# Patient Record
Sex: Female | Born: 1980 | Race: White | Hispanic: No | Marital: Married | Smoking: Former smoker
Health system: Southern US, Community
[De-identification: ages and names within clinical notes are randomized; demographics above are authoritative.]

## PROBLEM LIST (undated history)

## (undated) DIAGNOSIS — Z8619 Personal history of other infectious and parasitic diseases: Secondary | ICD-10-CM

## (undated) DIAGNOSIS — R112 Nausea with vomiting, unspecified: Secondary | ICD-10-CM

## (undated) DIAGNOSIS — Z9889 Other specified postprocedural states: Secondary | ICD-10-CM

## (undated) DIAGNOSIS — R87619 Unspecified abnormal cytological findings in specimens from cervix uteri: Secondary | ICD-10-CM

## (undated) DIAGNOSIS — K759 Inflammatory liver disease, unspecified: Secondary | ICD-10-CM

## (undated) DIAGNOSIS — G43909 Migraine, unspecified, not intractable, without status migrainosus: Secondary | ICD-10-CM

## (undated) DIAGNOSIS — IMO0002 Reserved for concepts with insufficient information to code with codable children: Secondary | ICD-10-CM

## (undated) DIAGNOSIS — Z853 Personal history of malignant neoplasm of breast: Secondary | ICD-10-CM

## (undated) DIAGNOSIS — R110 Nausea: Secondary | ICD-10-CM

## (undated) DIAGNOSIS — K219 Gastro-esophageal reflux disease without esophagitis: Secondary | ICD-10-CM

## (undated) DIAGNOSIS — Z973 Presence of spectacles and contact lenses: Secondary | ICD-10-CM

## (undated) DIAGNOSIS — R51 Headache: Secondary | ICD-10-CM

## (undated) DIAGNOSIS — R87629 Unspecified abnormal cytological findings in specimens from vagina: Secondary | ICD-10-CM

## (undated) HISTORY — PX: SEPTOPLASTY: SUR1290

## (undated) HISTORY — DX: Reserved for concepts with insufficient information to code with codable children: IMO0002

## (undated) HISTORY — DX: Unspecified abnormal cytological findings in specimens from vagina: R87.629

## (undated) HISTORY — PX: TONSILLECTOMY: SUR1361

## (undated) HISTORY — DX: Personal history of other infectious and parasitic diseases: Z86.19

## (undated) HISTORY — PX: WISDOM TOOTH EXTRACTION: SHX21

## (undated) HISTORY — DX: Nausea: R11.0

## (undated) HISTORY — DX: Migraine, unspecified, not intractable, without status migrainosus: G43.909

## (undated) HISTORY — DX: Unspecified abnormal cytological findings in specimens from cervix uteri: R87.619

## (undated) HISTORY — DX: Other specified postprocedural states: Z98.890

---

## 2004-03-02 DIAGNOSIS — K759 Inflammatory liver disease, unspecified: Secondary | ICD-10-CM

## 2004-03-02 HISTORY — DX: Inflammatory liver disease, unspecified: K75.9

## 2008-09-23 ENCOUNTER — Ambulatory Visit: Payer: Self-pay | Admitting: Internal Medicine

## 2008-09-23 DIAGNOSIS — G43909 Migraine, unspecified, not intractable, without status migrainosus: Secondary | ICD-10-CM | POA: Insufficient documentation

## 2008-10-17 ENCOUNTER — Telehealth: Payer: Self-pay | Admitting: Internal Medicine

## 2009-07-19 ENCOUNTER — Encounter: Payer: Self-pay | Admitting: Internal Medicine

## 2009-08-10 ENCOUNTER — Ambulatory Visit: Payer: Self-pay | Admitting: Internal Medicine

## 2009-08-10 DIAGNOSIS — J351 Hypertrophy of tonsils: Secondary | ICD-10-CM | POA: Insufficient documentation

## 2009-08-10 DIAGNOSIS — K120 Recurrent oral aphthae: Secondary | ICD-10-CM | POA: Insufficient documentation

## 2009-08-11 ENCOUNTER — Telehealth: Payer: Self-pay | Admitting: Internal Medicine

## 2009-10-12 ENCOUNTER — Ambulatory Visit: Payer: Self-pay | Admitting: Internal Medicine

## 2009-10-19 ENCOUNTER — Ambulatory Visit (HOSPITAL_BASED_OUTPATIENT_CLINIC_OR_DEPARTMENT_OTHER): Admission: RE | Admit: 2009-10-19 | Discharge: 2009-10-19 | Payer: Self-pay | Admitting: Otolaryngology

## 2009-10-19 ENCOUNTER — Ambulatory Visit: Payer: Self-pay | Admitting: Diagnostic Radiology

## 2009-11-23 ENCOUNTER — Ambulatory Visit (HOSPITAL_BASED_OUTPATIENT_CLINIC_OR_DEPARTMENT_OTHER): Admission: RE | Admit: 2009-11-23 | Discharge: 2009-11-23 | Payer: Self-pay | Admitting: Otolaryngology

## 2010-03-13 ENCOUNTER — Ambulatory Visit: Payer: Self-pay | Admitting: Nurse Practitioner

## 2010-03-13 ENCOUNTER — Other Ambulatory Visit: Payer: Self-pay | Admitting: Emergency Medicine

## 2010-03-13 ENCOUNTER — Inpatient Hospital Stay (HOSPITAL_COMMUNITY): Admission: AD | Admit: 2010-03-13 | Discharge: 2010-03-13 | Payer: Self-pay | Admitting: Obstetrics and Gynecology

## 2010-09-02 HISTORY — PX: TONSILLECTOMY: SUR1361

## 2010-09-02 HISTORY — PX: SEPTOPLASTY: SUR1290

## 2010-10-02 NOTE — Assessment & Plan Note (Signed)
Summary: Shannon Hale   Leafy Half   Vital Signs:  Patient profile:   30 year old female Height:      69 inches Weight:      187.50 pounds BMI:     27.79 O2 Sat:      97 % on Room air Temp:     98.2 degrees F oral Pulse rate:   126 / minute BP sitting:   114 / 74  (right arm)  Vitals Entered By: Lucious Groves (October 12, 2009 1:17 PM)  O2 Flow:  Room air CC: C/O more regular and severe HA, they are now more associated with cycle. Discuss changing med., Headaches Is Patient Diabetic? No Pain Assessment Patient in pain? yes      Intensity: 3 Onset of pain  very mild HA right now   Primary Care Provider:  DThomos Lemons DO  CC:  C/O more regular and severe HA, they are now more associated with cycle. Discuss changing med., and Headaches.  History of Present Illness:  Headaches      This is a 30 year old woman who presents with Headaches.  The patient reports nausea and photophobia.  The headache is described as intermittent and throbbing.  The location of the pain is unilateral on the left.  headaches seem to be getting worse.  symptoms precipitated by menses.  poor response to imitrex 50 mg in the past  Current Medications (verified): 1)  Ocella 3-0.03 Mg Tabs (Drospirenone-Ethinyl Estradiol) .... Take 1 Tablet By Mouth Once A Day 2)  Valacyclovir Hcl 500 Mg Tabs (Valacyclovir Hcl) .... One By Mouth Bid  Allergies (verified): No Known Drug Allergies  Past History:  Past Medical History: Migraine Headaches Vasovagal fainting 2001 and 2003  Transient hepatitis from food poisoning 2005  Deviated nasal septum  Family History: Breast ca - mother (alive at age 14) CAD - father (?angina episode at age 90 -  no cath, no recurrence),  PGF - CABG age 59 Depression - MGM Diabetes - MGM and PGM Colon ca - PGF   Social History: Occupation:  Education officer, environmental Married for 3 yrs Moved to [place] from [place] Husband and wife like to play tennis Never Smoked  Alcohol use-yes     Physical Exam  General:  alert, well-developed, and well-nourished.   Head:  normocephalic and atraumatic.   Eyes:  pupils equal, pupils round, and pupils reactive to light.   Lungs:  normal respiratory effort and normal breath sounds.   Heart:  normal rate, regular rhythm, no murmur, and no gallop.   Neurologic:  cranial nerves II-XII intact, strength normal in all extremities, gait normal, DTRs symmetrical and normal, and finger-to-nose normal.     Impression & Recommendations:  Problem # 1:  MIGRAINE HEADACHE (ICD-346.90) Pt having menstrual migraines.  Pt advised to start naproxen 220 mg over the counter 2-3 days before cycle starts.  If migraine occurs, take additional alleve and maxalt at the same time.  The following medications were removed from the medication list:    Sumatriptan Succinate 100 Mg Tabs (Sumatriptan succinate) ..... One by mouth once daily prn Her updated medication list for this problem includes:    Maxalt-mlt 10 Mg Tbdp (Rizatriptan benzoate) .Marland Kitchen... Take as directed  Complete Medication List: 1)  Ocella 3-0.03 Mg Tabs (Drospirenone-ethinyl estradiol) .... Take 1 tablet by mouth once a day 2)  Valacyclovir Hcl 500 Mg Tabs (Valacyclovir hcl) .... One by mouth bid 3)  Maxalt-mlt 10 Mg Tbdp (Rizatriptan benzoate) .Marland KitchenMarland KitchenMarland Kitchen  Take as directed 4)  Omeprazole 20 Mg Cpdr (Omeprazole) .... One by mouth once daily  Patient Instructions: 1)  Please schedule a follow-up appointment in 2 months. Prescriptions: OMEPRAZOLE 20 MG CPDR (OMEPRAZOLE) one by mouth once daily  #30 x 2   Entered and Authorized by:   D. Thomos Lemons DO   Signed by:   D. Thomos Lemons DO on 10/12/2009   Method used:   Electronically to        [facility]* (retail)       [address]       [address] Shipping/mailing       [place], [place]  [address]       Ph: [phone]       Fax: [phone]   RxID:   [id number] MAXALT-MLT 10 MG TBDP (RIZATRIPTAN BENZOATE) take as  directed  #10 x 2   Entered and Authorized by:   D. Thomos Lemons DO   Signed by:   D. Thomos Lemons DO on 10/12/2009   Method used:   Electronically to        [facility]* (retail)       [address]       [address]mail[address]       [place], [place]  [address]       Ph: [phone]       Fax: [phone]   RxID:   [id number]

## 2010-11-18 LAB — CBC
HCT: 37.2 % (ref 36.0–46.0)
HCT: 35.3 % — ABNORMAL LOW (ref 36.0–46.0)
Hemoglobin: 12.8 g/dL (ref 12.0–15.0)
Hemoglobin: 12.1 g/dL (ref 12.0–15.0)
Hemoglobin: 12.9 g/dL (ref 12.0–15.0)
MCH: 30 pg (ref 26.0–34.0)
MCH: 30 pg (ref 26.0–34.0)
MCHC: 34.5 g/dL (ref 30.0–36.0)
MCV: 87.6 fL (ref 78.0–100.0)
MCV: 87.7 fL (ref 78.0–100.0)
Platelets: 224 10*3/uL (ref 150–400)
RBC: 4.33 MIL/uL (ref 3.87–5.11)
RDW: 12.2 % (ref 11.5–15.5)
WBC: 12.8 10*3/uL — ABNORMAL HIGH (ref 4.0–10.5)
WBC: 13.1 10*3/uL — ABNORMAL HIGH (ref 4.0–10.5)

## 2010-11-18 LAB — DIFFERENTIAL
Basophils Absolute: 0.1 10*3/uL (ref 0.0–0.1)
Eosinophils Relative: 1 % (ref 0–5)
Lymphocytes Relative: 31 % (ref 12–46)
Lymphs Abs: 3.9 10*3/uL (ref 0.7–4.0)
Monocytes Relative: 6 % (ref 3–12)
Neutro Abs: 7.9 10*3/uL — ABNORMAL HIGH (ref 1.7–7.7)

## 2010-11-18 LAB — BASIC METABOLIC PANEL
CO2: 23 mEq/L (ref 19–32)
Calcium: 9.7 mg/dL (ref 8.4–10.5)
Creatinine, Ser: 0.8 mg/dL (ref 0.4–1.2)
GFR calc Af Amer: >60 mL/min (ref >60)
GFR calc non Af Amer: >60 mL/min (ref >60)

## 2010-11-18 LAB — ABO/RH: ABO/RH(D): A POS

## 2010-11-18 LAB — HCG, QUANTITATIVE, PREGNANCY: hCG, Beta Chain, Quant, S: 57 m[IU]/mL — ABNORMAL HIGH (ref <5)

## 2010-11-19 ENCOUNTER — Ambulatory Visit (INDEPENDENT_AMBULATORY_CARE_PROVIDER_SITE_OTHER): Payer: Commercial Managed Care - PPO | Admitting: Internal Medicine

## 2010-11-19 DIAGNOSIS — M546 Pain in thoracic spine: Secondary | ICD-10-CM

## 2010-11-19 DIAGNOSIS — G43909 Migraine, unspecified, not intractable, without status migrainosus: Secondary | ICD-10-CM

## 2010-11-26 LAB — HEPATIC FUNCTION PANEL
Bilirubin, Direct: 0.2 mg/dL (ref 0.0–0.3)
Total Bilirubin: 0.3 mg/dL (ref 0.3–1.2)

## 2010-11-26 LAB — BASIC METABOLIC PANEL
Calcium: 9.5 mg/dL (ref 8.4–10.5)
Chloride: 104 mEq/L (ref 96–112)
Glucose, Bld: 100 mg/dL — ABNORMAL HIGH (ref 70–99)
Potassium: 4.1 mEq/L (ref 3.5–5.1)

## 2011-01-29 LAB — RPR: RPR: NONREACTIVE

## 2011-01-29 LAB — ANTIBODY SCREEN: Antibody Screen: NEGATIVE

## 2011-01-29 LAB — RUBELLA ANTIBODY, IGM: Rubella: IMMUNE

## 2011-07-29 LAB — STREP B DNA PROBE: GBS: NEGATIVE

## 2011-08-30 ENCOUNTER — Telehealth (HOSPITAL_COMMUNITY): Payer: Self-pay | Admitting: *Deleted

## 2011-08-30 ENCOUNTER — Encounter (HOSPITAL_COMMUNITY): Payer: Self-pay | Admitting: *Deleted

## 2011-08-30 NOTE — Telephone Encounter (Signed)
Preadmission screen  

## 2011-09-02 ENCOUNTER — Inpatient Hospital Stay (HOSPITAL_COMMUNITY): Payer: 59 | Admitting: Anesthesiology

## 2011-09-02 ENCOUNTER — Encounter (HOSPITAL_COMMUNITY): Payer: Self-pay | Admitting: *Deleted

## 2011-09-02 ENCOUNTER — Encounter (HOSPITAL_COMMUNITY): Payer: Self-pay | Admitting: Anesthesiology

## 2011-09-02 ENCOUNTER — Inpatient Hospital Stay (HOSPITAL_COMMUNITY)
Admission: AD | Admit: 2011-09-02 | Discharge: 2011-09-04 | DRG: 775 | Disposition: A | Discharge status: Home / Self Care | Payer: 59 | Source: Ambulatory Visit | Attending: Obstetrics and Gynecology | Admitting: Obstetrics and Gynecology

## 2011-09-02 ENCOUNTER — Other Ambulatory Visit: Payer: Self-pay | Admitting: Obstetrics and Gynecology

## 2011-09-02 DIAGNOSIS — G43909 Migraine, unspecified, not intractable, without status migrainosus: Secondary | ICD-10-CM

## 2011-09-02 DIAGNOSIS — J351 Hypertrophy of tonsils: Secondary | ICD-10-CM

## 2011-09-02 DIAGNOSIS — M546 Pain in thoracic spine: Secondary | ICD-10-CM

## 2011-09-02 DIAGNOSIS — K120 Recurrent oral aphthae: Secondary | ICD-10-CM

## 2011-09-02 LAB — CBC
MCH: 30 pg (ref 26.0–34.0)
MCHC: 35.3 g/dL (ref 30.0–36.0)
Platelets: 230 10*3/uL (ref 150–400)
RDW: 12.9 % (ref 11.5–15.5)

## 2011-09-02 LAB — RPR: RPR Ser Ql: NONREACTIVE

## 2011-09-02 MED ORDER — LACTATED RINGERS IV SOLN
INTRAVENOUS | Status: DC
  Administered 2011-09-02 (×2): via INTRAVENOUS

## 2011-09-02 MED ORDER — SIMETHICONE 80 MG PO CHEW: 80.0000 mg | CHEWABLE_TABLET | ORAL | Status: DC | PRN

## 2011-09-02 MED ORDER — WITCH HAZEL-GLYCERIN EX PADS
1.0000 "application " | MEDICATED_PAD | CUTANEOUS | Status: DC | PRN
  Administered 2011-09-02: 1 via TOPICAL

## 2011-09-02 MED ORDER — ACETAMINOPHEN 325 MG PO TABS: 650.0000 mg | ORAL_TABLET | ORAL | Status: DC | PRN

## 2011-09-02 MED ORDER — LACTATED RINGERS IV SOLN: 500.0000 mL | Freq: Once | INTRAVENOUS | Status: DC

## 2011-09-02 MED ORDER — DIPHENHYDRAMINE HCL 50 MG/ML IJ SOLN: 12.5000 mg | INTRAMUSCULAR | Status: DC | PRN

## 2011-09-02 MED ORDER — OXYTOCIN BOLUS FROM INFUSION
500.0000 mL | Freq: Once | INTRAVENOUS | Status: DC
  Filled 2011-09-02: qty 500
  Filled 2011-09-02: qty 1000

## 2011-09-02 MED ORDER — TERBUTALINE SULFATE 1 MG/ML IJ SOLN: 0.2500 mg | Freq: Once | INTRAMUSCULAR | Status: DC | PRN

## 2011-09-02 MED ORDER — LIDOCAINE HCL 1.5 % IJ SOLN
INTRAMUSCULAR | Status: DC | PRN
  Administered 2011-09-02 (×2): 5 mL via EPIDURAL

## 2011-09-02 MED ORDER — CITRIC ACID-SODIUM CITRATE 334-500 MG/5ML PO SOLN: 30.0000 mL | ORAL | Status: DC | PRN

## 2011-09-02 MED ORDER — DIPHENHYDRAMINE HCL 25 MG PO CAPS: 25.0000 mg | ORAL_CAPSULE | Freq: Four times a day (QID) | ORAL | Status: DC | PRN

## 2011-09-02 MED ORDER — PRENATAL MULTIVITAMIN CH
1.0000 | ORAL_TABLET | Freq: Every day | ORAL | Status: DC
  Administered 2011-09-03 – 2011-09-04 (×2): 1 via ORAL
  Filled 2011-09-02 (×2): qty 1

## 2011-09-02 MED ORDER — LACTATED RINGERS IV SOLN
500.0000 mL | INTRAVENOUS | Status: DC | PRN
  Administered 2011-09-02: 1000 mL via INTRAVENOUS

## 2011-09-02 MED ORDER — PHENYLEPHRINE 40 MCG/ML (10ML) SYRINGE FOR IV PUSH (FOR BLOOD PRESSURE SUPPORT)
80.0000 ug | PREFILLED_SYRINGE | INTRAVENOUS | Status: DC | PRN
  Filled 2011-09-02: qty 5

## 2011-09-02 MED ORDER — ZOLPIDEM TARTRATE 5 MG PO TABS: 5.0000 mg | ORAL_TABLET | Freq: Every evening | ORAL | Status: DC | PRN

## 2011-09-02 MED ORDER — ONDANSETRON HCL 4 MG PO TABS: 4.0000 mg | ORAL_TABLET | ORAL | Status: DC | PRN

## 2011-09-02 MED ORDER — OXYCODONE-ACETAMINOPHEN 5-325 MG PO TABS
1.0000 | ORAL_TABLET | ORAL | Status: DC | PRN
  Administered 2011-09-03 (×3): 1 via ORAL
  Filled 2011-09-02 (×3): qty 1

## 2011-09-02 MED ORDER — SENNOSIDES-DOCUSATE SODIUM 8.6-50 MG PO TABS
2.0000 | ORAL_TABLET | Freq: Every day | ORAL | Status: DC
  Administered 2011-09-02 – 2011-09-03 (×2): 2 via ORAL

## 2011-09-02 MED ORDER — OXYTOCIN 20 UNITS IN LACTATED RINGERS INFUSION - SIMPLE
1.0000 m[IU]/min | INTRAVENOUS | Status: DC
  Administered 2011-09-02: 2 m[IU]/min via INTRAVENOUS
  Administered 2011-09-02: 1 m[IU]/min via INTRAVENOUS

## 2011-09-02 MED ORDER — IBUPROFEN 600 MG PO TABS
600.0000 mg | ORAL_TABLET | Freq: Four times a day (QID) | ORAL | Status: DC
  Administered 2011-09-02 – 2011-09-04 (×7): 600 mg via ORAL
  Filled 2011-09-02 (×7): qty 1

## 2011-09-02 MED ORDER — FLEET ENEMA 7-19 GM/118ML RE ENEM: 1.0000 | ENEMA | RECTAL | Status: DC | PRN

## 2011-09-02 MED ORDER — ONDANSETRON HCL 4 MG/2ML IJ SOLN: 4.0000 mg | INTRAMUSCULAR | Status: DC | PRN

## 2011-09-02 MED ORDER — PHENYLEPHRINE 40 MCG/ML (10ML) SYRINGE FOR IV PUSH (FOR BLOOD PRESSURE SUPPORT): 80.0000 ug | PREFILLED_SYRINGE | INTRAVENOUS | Status: DC | PRN

## 2011-09-02 MED ORDER — EPHEDRINE 5 MG/ML INJ
10.0000 mg | INTRAVENOUS | Status: DC | PRN
  Filled 2011-09-02: qty 4

## 2011-09-02 MED ORDER — OXYTOCIN 20 UNITS IN LACTATED RINGERS INFUSION - SIMPLE
125.0000 mL/h | Freq: Once | INTRAVENOUS | Status: AC
  Administered 2011-09-02: 500 mL/h via INTRAVENOUS

## 2011-09-02 MED ORDER — TETANUS-DIPHTH-ACELL PERTUSSIS 5-2.5-18.5 LF-MCG/0.5 IM SUSP: 0.5000 mL | Freq: Once | INTRAMUSCULAR | Status: DC

## 2011-09-02 MED ORDER — BENZOCAINE-MENTHOL 20-0.5 % EX AERO
1.0000 "application " | INHALATION_SPRAY | CUTANEOUS | Status: DC | PRN
  Administered 2011-09-02: 1 via TOPICAL

## 2011-09-02 MED ORDER — OXYCODONE-ACETAMINOPHEN 5-325 MG PO TABS: 2.0000 | ORAL_TABLET | ORAL | Status: DC | PRN

## 2011-09-02 MED ORDER — DIBUCAINE 1 % RE OINT
1.0000 "application " | TOPICAL_OINTMENT | RECTAL | Status: DC | PRN
  Administered 2011-09-02: 1 via RECTAL
  Filled 2011-09-02: qty 28

## 2011-09-02 MED ORDER — ONDANSETRON HCL 4 MG/2ML IJ SOLN
4.0000 mg | Freq: Four times a day (QID) | INTRAMUSCULAR | Status: DC | PRN
  Administered 2011-09-02: 4 mg via INTRAVENOUS
  Filled 2011-09-02: qty 2

## 2011-09-02 MED ORDER — FENTANYL 2.5 MCG/ML BUPIVACAINE 1/10 % EPIDURAL INFUSION (WH - ANES)
14.0000 mL/h | INTRAMUSCULAR | Status: DC
  Administered 2011-09-02 (×3): 14 mL/h via EPIDURAL
  Filled 2011-09-02 (×3): qty 60

## 2011-09-02 MED ORDER — BENZOCAINE-MENTHOL 20-0.5 % EX AERO
INHALATION_SPRAY | CUTANEOUS | Status: AC
  Administered 2011-09-02: 1 via TOPICAL
  Filled 2011-09-02: qty 56

## 2011-09-02 MED ORDER — LANOLIN HYDROUS EX OINT: TOPICAL_OINTMENT | CUTANEOUS | Status: DC | PRN

## 2011-09-02 MED ORDER — EPHEDRINE 5 MG/ML INJ: 10.0000 mg | INTRAVENOUS | Status: DC | PRN

## 2011-09-02 MED ORDER — LIDOCAINE HCL (PF) 1 % IJ SOLN
30.0000 mL | INTRAMUSCULAR | Status: DC | PRN
  Filled 2011-09-02: qty 30

## 2011-09-02 MED ORDER — IBUPROFEN 600 MG PO TABS
600.0000 mg | ORAL_TABLET | Freq: Four times a day (QID) | ORAL | Status: DC | PRN
  Administered 2011-09-02: 600 mg via ORAL
  Filled 2011-09-02: qty 1

## 2011-09-02 NOTE — Progress Notes (Signed)
Pt states been contracting since 0030

## 2011-09-02 NOTE — H&P (Signed)
Shannon Hale is a 30 y.o. female presenting for UCs starting about 12:30am. No ROM, no HA/vision change, no epigastric pain. Maternal Medical History:  Reason for admission: Reason for admission: contractions.  Contractions: Onset was 6-12 hours ago.   Frequency: regular.   Perceived severity is strong.    Fetal activity: Perceived fetal activity is normal.      OB History    Grav Para Term Preterm Abortions TAB SAB Ect Mult Living   2 0   1  1   0     Past Medical History  Diagnosis Date  . Abnormal Pap smear   . History of chicken pox    Past Surgical History  Procedure Date  . Tonsillectomy   . Septoplasty   . Wisdom tooth extraction   . No past surgeries    Family History: family history includes Cancer in her maternal grandmother, mother, and paternal grandfather; Depression in her maternal grandmother; Diabetes in her maternal grandmother, paternal grandmother, and paternal uncle; Heart disease in her father and paternal grandfather; Hypertension in her maternal grandmother; and Seizures in her brother. Social History:  reports that she has never smoked. She has never used smokeless tobacco. She reports that she does not drink alcohol or use illicit drugs.  Review of Systems  Eyes: Negative for blurred vision.  Gastrointestinal: Negative for abdominal pain.  Neurological: Negative for headaches.    Dilation: 4 Effacement (%): 100 Station: 0 Exam by:: Dr. Henderson Cloud Blood pressure 118/64, pulse 87, temperature 98.4 F (36.9 C), temperature source Oral, resp. rate 16, height 5\' 9"  (1.753 m), weight 102.059 kg (225 lb), SpO2 97.00%. Maternal Exam:  Uterine Assessment: Contraction strength is firm.  Contraction frequency is regular.      Fetal Exam Fetal Monitor Review: Pattern: accelerations present.       Physical Exam  Cardiovascular: Normal rate and regular rhythm.   Respiratory: Effort normal and breath sounds normal.  GI: There is no tenderness.    Neurological: She has normal reflexes.  Epidural in  AROM  Clear Prenatal labs: ABO, Rh:   Antibody: Negative (05/29 0000) Rubella: Immune (05/29 0000) RPR: Nonreactive (05/29 0000)  HBsAg: Negative (05/29 0000)  HIV: Non-reactive (05/29 0000)  GBS: Negative (11/26 0000)   Assessment/Plan: 30 yo MWF G2P0 at 40 0/7 weeks with active labor . All questions answered.  Shannon Hale,Shannon Hale E 09/02/2011, 8:28 AM

## 2011-09-02 NOTE — Anesthesia Preprocedure Evaluation (Signed)

## 2011-09-02 NOTE — Anesthesia Procedure Notes (Signed)
Epidural Patient location during procedure: OB Start time: 09/02/2011 8:02 AM  Staffing Anesthesiologist: Brayton Caves R Performed by: anesthesiologist   Preanesthetic Checklist Completed: patient identified, site marked, surgical consent, pre-op evaluation, timeout performed, IV checked, risks and benefits discussed and monitors and equipment checked  Epidural Patient position: sitting Prep: site prepped and draped and DuraPrep Patient monitoring: continuous pulse ox and blood pressure Approach: midline Injection technique: LOR air and LOR saline  Needle:  Needle type: Tuohy  Needle gauge: 17 G Needle length: 9 cm Needle insertion depth: 6 cm Catheter type: closed end flexible Catheter size: 19 Gauge Catheter at skin depth: 11 cm Test dose: negative  Assessment Events: blood not aspirated, injection not painful, no injection resistance, negative IV test and no paresthesia  Additional Notes Patient identified.  Risk benefits discussed including failed block, incomplete pain control, headache, nerve damage, paralysis, blood pressure changes, nausea, vomiting, reactions to medication both toxic or allergic, and postpartum back pain.  Patient expressed understanding and wished to proceed.  All questions were answered.  Sterile technique used throughout procedure and epidural site dressed with sterile barrier dressing. No paresthesia or other complications noted.The patient did not experience any signs of intravascular injection such as tinnitus or metallic taste in mouth nor signs of intrathecal spread such as rapid motor block. Please see nursing notes for vital signs.

## 2011-09-02 NOTE — Progress Notes (Signed)
Delivery Note Pushing x 2.5 hrs Variable decels to 70-80s getting deeper Meconium noted vtx at [facility], crowning Pt unable to push past perineum despite good effort D/W pt/husband MLE, poss VE with risks, all questions answered Second degree MLE performed Over 2 UCs, SVD of VMI apgars 8/9 Art pH pending Wt 7#, 1oz Placenta 3 vessels, intact to path Cervix/vagina intact Third degree extension of MLE repaired, rectum checked Pt/infant stable in LDR

## 2011-09-03 LAB — CBC
HCT: 31.2 % — ABNORMAL LOW (ref 36.0–46.0)
MCH: 29.5 pg (ref 26.0–34.0)
MCHC: 34.6 g/dL (ref 30.0–36.0)
RDW: 13 % (ref 11.5–15.5)

## 2011-09-03 NOTE — Anesthesia Postprocedure Evaluation (Signed)
  Anesthesia Post Note  Patient: Shannon Hale  Procedure(s) Performed: * No procedures listed *  Anesthesia type: Epidural  Patient location: Mother/Baby  Post pain: Pain level controlled  Post assessment: Post-op Vital signs reviewed  Last Vitals:  Filed Vitals:   09/03/11 0539  BP: 115/70  Pulse: 80  Temp: 36.8 C  Resp: 20    Post vital signs: Reviewed  Level of consciousness:alert  Complications: No apparent anesthesia complications

## 2011-09-03 NOTE — Progress Notes (Signed)
PPD #1 Voiding, Decreasing lochia, good pain relief  Blood pressure 115/70, pulse 80, temperature 98.2 F (36.8 C), temperature source Oral, resp. rate 20, height 5\' 9"  (1.753 m), weight 102.059 kg (225 lb), SpO2 85.00%, unknown if currently breastfeeding.  FFNT  Satisfactory

## 2011-09-04 ENCOUNTER — Inpatient Hospital Stay (HOSPITAL_COMMUNITY): Admission: RE | Admit: 2011-09-04 | Payer: Commercial Managed Care - PPO | Source: Ambulatory Visit

## 2011-09-04 MED ORDER — IBUPROFEN 600 MG PO TABS: 600.0000 mg | ORAL_TABLET | Freq: Four times a day (QID) | ORAL | Status: AC

## 2011-09-04 MED ORDER — BENZOCAINE-MENTHOL 20-0.5 % EX AERO
INHALATION_SPRAY | CUTANEOUS | Status: AC
  Administered 2011-09-04: 11:00:00
  Filled 2011-09-04: qty 56

## 2011-09-04 MED ORDER — OXYCODONE-ACETAMINOPHEN 5-325 MG PO TABS: 1.0000 | ORAL_TABLET | ORAL | Status: AC | PRN

## 2011-09-04 NOTE — Progress Notes (Signed)
Post Partum Day 2 Subjective: no complaints, up ad lib, voiding and + flatus  Objective: Blood pressure 98/59, pulse 86, temperature 98.4 F (36.9 C), temperature source Oral, resp. rate 20, height 5\' 9"  (1.753 m), weight 102.059 kg (225 lb), SpO2 85.00%, unknown if currently breastfeeding.  Physical Exam:  General: alert and cooperative Lochia: appropriate Uterine Fundus: firm Perineum intact DVT Evaluation: No evidence of DVT seen on physical exam.   Basename 09/03/11 0509 09/02/11 0630  HGB 10.8* 12.3  HCT 31.2* 34.8*    Assessment/Plan: Discharge home   LOS: 2 days   Shannon Hale 09/04/2011, 8:20 AM

## 2011-09-04 NOTE — Discharge Summary (Signed)
Obstetric Discharge Summary Reason for Admission: onset of labor Prenatal Procedures: ultrasound Intrapartum Procedures: vacuum Postpartum Procedures: none Complications-Operative and Postpartum: 3 degree perineal laceration Hemoglobin  Date Value Range Status  09/03/2011 10.8* 12.0-15.0 (g/dL) Final     HCT  Date Value Range Status  09/03/2011 31.2* 36.0-46.0 (%) Final    Discharge Diagnoses: Term Pregnancy-delivered  Discharge Information: Date: 09/04/2011 Activity: pelvic rest Diet: routine Medications: PNV, Ibuprofen and Percocet Condition: stable Instructions: refer to practice specific booklet Discharge to: home   Newborn Data: Live born female  Birth Weight: 7 lb 1 oz (3204 g) APGAR: 8, 9  Home with mother.  Leverne Amrhein G 09/04/2011, 8:33 AM

## 2012-02-24 ENCOUNTER — Ambulatory Visit (HOSPITAL_BASED_OUTPATIENT_CLINIC_OR_DEPARTMENT_OTHER)
Admission: RE | Admit: 2012-02-24 | Discharge: 2012-02-24 | Disposition: A | Discharge status: Home / Self Care | Payer: 59 | Source: Ambulatory Visit | Attending: Internal Medicine | Admitting: Internal Medicine

## 2012-02-24 ENCOUNTER — Ambulatory Visit (INDEPENDENT_AMBULATORY_CARE_PROVIDER_SITE_OTHER): Payer: 59 | Admitting: Internal Medicine

## 2012-02-24 ENCOUNTER — Encounter: Payer: Self-pay | Admitting: Internal Medicine

## 2012-02-24 VITALS — BP 110/78 | HR 88 | Temp 98.2°F | Resp 16 | Wt 191.0 lb

## 2012-02-24 DIAGNOSIS — R110 Nausea: Secondary | ICD-10-CM | POA: Insufficient documentation

## 2012-02-24 DIAGNOSIS — K8020 Calculus of gallbladder without cholecystitis without obstruction: Secondary | ICD-10-CM

## 2012-02-24 LAB — LIPASE: Lipase: 20 U/L (ref 0–75)

## 2012-02-24 LAB — AMYLASE: Amylase: 40 U/L (ref 0–105)

## 2012-02-24 LAB — HEPATIC FUNCTION PANEL
ALT: 13 U/L (ref 0–35)
Albumin: 5 g/dL (ref 3.5–5.2)
Indirect Bilirubin: 0.4 mg/dL (ref 0.0–0.9)
Total Protein: 7.6 g/dL (ref 6.0–8.3)

## 2012-02-24 NOTE — Progress Notes (Signed)
  Subjective:    Patient ID: Shannon Hale, female    DOB: 07/22/1981, 31 y.o.   MRN: [id number]  HPI Pt presents to clinic for evaluation of possible gallstones. Has noted intermittent nausea for several weeks. Is postprandial without associated abdominal pain or emesis. Underwent informal [place] with finding of multiple gallstones. No other alleviating or exacerbating factors.   Past Medical History  Diagnosis Date  . Abnormal Pap smear   . History of chicken pox    Past Surgical History  Procedure Date  . Tonsillectomy   . Septoplasty   . Wisdom tooth extraction   . No past surgeries     reports that she has never smoked. She has never used smokeless tobacco. She reports that she does not drink alcohol or use illicit drugs. family history includes Cancer in her maternal grandmother, mother, and paternal grandfather; Depression in her maternal grandmother; Diabetes in her maternal grandmother, paternal grandmother, and paternal uncle; Heart disease in her father and paternal grandfather; Hypertension in her maternal grandmother; and Seizures in her brother. No Known Allergies   Review of Systems see hpi     Objective:   Physical Exam  Vitals reviewed. Constitutional: She appears well-developed and well-nourished. No distress.  HENT:  Head: Normocephalic and atraumatic.  Abdominal: Soft. Bowel sounds are normal. She exhibits no distension. There is no tenderness. There is no rebound and no guarding.  Neurological: She is alert.  Skin: Skin is warm and dry. She is not diaphoretic.  Psychiatric: She has a normal mood and affect.          Assessment & Plan:

## 2012-02-26 ENCOUNTER — Telehealth: Payer: Self-pay | Admitting: *Deleted

## 2012-02-26 NOTE — Telephone Encounter (Signed)
Call returned to patient, she was advised per Dr Rodena Medin instructions. She stated that she will go to the surgery consult and see what her options are. She did state that she would like to try the medication,however she was advised to see what would be suggested at the surgery consult. Patient has verbalized understanding and agrees as instructed.

## 2012-02-26 NOTE — Telephone Encounter (Signed)
actigall is available however i believe it is expensive, may not work to dissolve the stones and if it does work when you stop the medication the stones can redevelop. So it's not really used. Also seeing the surgeon as a consult doesn't guarantee they'll recommend surgery. Typically need to see if surgery agrees symptoms are related and see what options they offer. Also would not be wrong to attempt a 2 week course of PPI (nexium or dexilant samples qd) to make sure sx's aren't at least partially related to stomach

## 2012-02-26 NOTE — Telephone Encounter (Signed)
Patient called back and left voice message stating she has checked with [facility] and was informed  there is a generic comparison for the Actigall that would cost her a $4 co-pay. Her message stated that she would like to know if Dr Rodena Medin would provide her Rx for this. She feels this would be a better alternative right now with having a 67 month old at home.

## 2012-02-26 NOTE — Telephone Encounter (Signed)
Patient called and left voice message wanting to know if she could take a medication to dissolve her gallstone versus having surgery. Her message stated that she is a new mom, and is concerned about scheduling and having surgery.

## 2012-02-27 MED ORDER — URSODIOL 300 MG PO CAPS: 300.0000 mg | ORAL_CAPSULE | Freq: Two times a day (BID) | ORAL | Status: AC

## 2012-02-27 NOTE — Telephone Encounter (Signed)
Call placed to patient, she was informed of medication to pharmacy.

## 2012-02-27 NOTE — Telephone Encounter (Signed)
actigall 300mg  po bid #60 rf 5. F/u appt in 17m. Still encourage PPI.

## 2012-02-29 DIAGNOSIS — K8020 Calculus of gallbladder without cholecystitis without obstruction: Secondary | ICD-10-CM | POA: Insufficient documentation

## 2012-02-29 NOTE — Assessment & Plan Note (Signed)
Obtain formal US and lfts. Consider surgery consult pending results

## 2012-03-25 ENCOUNTER — Ambulatory Visit (INDEPENDENT_AMBULATORY_CARE_PROVIDER_SITE_OTHER): Payer: Commercial Managed Care - PPO | Admitting: Surgery

## 2012-03-25 ENCOUNTER — Encounter (INDEPENDENT_AMBULATORY_CARE_PROVIDER_SITE_OTHER): Payer: Self-pay | Admitting: Surgery

## 2012-03-25 VITALS — BP 116/64 | HR 60 | Temp 97.8°F | Resp 12 | Ht 69.0 in | Wt 187.8 lb

## 2012-03-25 DIAGNOSIS — K8020 Calculus of gallbladder without cholecystitis without obstruction: Secondary | ICD-10-CM

## 2012-03-25 NOTE — Patient Instructions (Addendum)
Cholelithiasis Cholelithiasis (also called gallstones) is a form of gallbladder disease where gallstones form in your gallbladder. The gallbladder is a non-essential organ that stores bile made in the liver, which helps digest fats. Gallstones begin as small crystals and slowly grow into stones. Gallstone pain occurs when the gallbladder spasms, and a gallstone is blocking the duct. Pain can also occur when a stone passes out of the duct.  Women are more likely to develop gallstones than men. Other factors that increase the risk of gallbladder disease are:  Having multiple pregnancies. Physicians sometimes advise removing diseased gallbladders before future pregnancies.   Obesity.   Diets heavy in fried foods and fat.   Increasing age (older than 60).   Prolonged use of medications containing female hormones.   Diabetes mellitus.   Rapid weight loss.   Family history of gallstones (heredity).  SYMPTOMS  Feeling sick to your stomach (nauseous).   Abdominal pain.   Yellowing of the skin (jaundice).   Sudden pain. It may persist from several minutes to several hours.   Worsening pain with deep breathing or when jarred.   Fever.   Tenderness to the touch.  In some cases, when gallstones do not move into the bile duct, people have no pain or symptoms. These are called "silent" gallstones. TREATMENT In severe cases, emergency surgery may be required. HOME CARE INSTRUCTIONS   Only take over-the-counter or prescription medicines for pain, discomfort, or fever as directed by your caregiver.   Follow a low-fat diet until seen again. Fat causes the gallbladder to contract, which can result in pain.   Follow up as instructed. Attacks are almost always recurrent and surgery is usually required for permanent treatment.  SEEK IMMEDIATE MEDICAL CARE IF:   Your pain increases and is not controlled by medications.   You have an oral temperature above 102 F (38.9 C), not controlled by  medication.   You develop nausea and vomiting.  MAKE SURE YOU:   Understand these instructions.   Will watch your condition.   Will get help right away if you are not doing well or get worse.  Document Released: 08/15/2005 Document Revised: 08/08/2011 Document Reviewed: 10/18/2010 [employer] Patient Information 2012 [employer]. 

## 2012-03-25 NOTE — Progress Notes (Addendum)
Subjective:     Patient ID: Shannon Hale, female   DOB: 11-04-1980, 31 y.o.   MRN: [id number]  HPI  Shannon Hale  Dec 18, 1980 [id number]  Patient Care Team: Edwyna Perfect, MD as PCP - General (Internal Medicine)  This patient is a 31 y.o.female who presents today for surgical evaluation at the request of Dr. Rodena Medin.  Reason for visit: Gallstones.  Nausea.  Pleasant female who is now six months postpartum.  Noticed some mild nausea for the past two months.  Not associated with eating.  No definable trigger.  She works in radiology.  She was worried that she was pregnant again.  Pregnancy test is negative.  Ultrasounds negative there.  They did note gallstones.  She was concerned and discussed with her primary care physician.  She was hoping to try Actigall to dissolve stones.  She was started on that.  Was recommended to consider a trial of proton pump inhibitors but has not done that.  Denies any heartburn or reflux.  Was sent to [place] to see if cholecystectomy is needed.  She is good physical activity.  No personal nor family history of GI/colon cancer, inflammatory bowel disease, irritable bowel syndrome, allergy such as Celiac Sprue, dietary/dairy problems, colitis, ulcers nor gastritis.   No recent sick contacts/gastroenteritis.  No travel outside the country.  No changes in diet. She did have heartburn/reflux that during her pregnancy But it was mild.  None now  She has a bowel movement every day.  No changes in that.  No history of prior surgeries.  Patient Active Problem List  Diagnosis  . MIGRAINE HEADACHE  . HYPERTROPHY OF TONSILS ALONE  . APHTHOUS STOMATITIS  . BACK PAIN, THORACIC REGION  . Cholelithiasis, asymptomatic    Past Medical History  Diagnosis Date  . Abnormal Pap smear   . History of chicken pox   . Nausea     Past Surgical History  Procedure Date  . Tonsillectomy   . Septoplasty   . Wisdom tooth extraction   . No past surgeries     History    Social History  . Marital Status: Married    Spouse Name: N/A    Number of Children: N/A  . Years of Education: N/A   Occupational History  . Not on file.   Social History Main Topics  . Smoking status: Never Smoker   . Smokeless tobacco: Never Used  . Alcohol Use: 0.6 oz/week    1 Glasses of wine per week  . Drug Use: No  . Sexually Active: Yes   Other Topics Concern  . Not on file   Social History Narrative  . No narrative on file    Family History  Problem Relation Age of Onset  . Cancer Mother     breast, leaukemia  . Heart disease Father   . Seizures Brother   . Diabetes Paternal Uncle   . Hypertension Maternal Grandmother   . Diabetes Maternal Grandmother   . Depression Maternal Grandmother     anxiety also  . Cancer Maternal Grandmother     breast  . Diabetes Paternal Grandmother   . Heart disease Paternal Grandfather   . Cancer Paternal Grandfather     colon    Current Outpatient Prescriptions  Medication Sig Dispense Refill  . Multiple Vitamin (MULTIVITAMIN) tablet Take 1 tablet by mouth daily.      . norethindrone (MICRONOR,CAMILA,ERRIN) 0.35 MG tablet Take 1 tablet by mouth daily.      Marland Kitchen  Prenatal Vit-Fe Fumarate-FA (PRENATAL MULTIVITAMIN) TABS Take 1 tablet by mouth daily.        . ursodiol (ACTIGALL) 300 MG capsule Take 1 capsule (300 mg total) by mouth 2 (two) times daily.  60 capsule  5     No Known Allergies  BP 116/64  Pulse 60  Temp 97.8 F (36.6 C) (Temporal)  Resp 12  Ht 5\' 9"  (1.753 m)  Wt 187 lb 12.8 oz (85.186 kg)  BMI 27.73 kg/m2  Breastfeeding? No  US Abdomen Complete  02/24/2012  *RADIOLOGY REPORT*  Clinical Data:  Nausea.  COMPLETE ABDOMINAL ULTRASOUND  Comparison:  None.  Findings:  Gallbladder:  The multiple mobile small gallstones.  No wall thickening.  Negative sonographic Murphy's.  Common bile duct:   Normal caliber, 2 mm.  Liver:  No focal lesion identified.  Within normal limits in parenchymal echogenicity.  IVC:   Appears normal.  Pancreas:  No focal abnormality seen.  Spleen:  Within normal limits in size and echotexture.  Right Kidney:   Normal in size and parenchymal echogenicity.  No evidence of mass or hydronephrosis.  Left Kidney:  Normal in size and parenchymal echogenicity.  No evidence of mass or hydronephrosis.  Abdominal aorta:  No aneurysm identified.  IMPRESSION: Cholelithiasis.  No evidence of acute cholecystitis.  Original Report Authenticated By: Cyndie Chime, M.D.     Review of Systems  Constitutional: Negative for fever, chills, diaphoresis, appetite change and fatigue.  HENT: Negative for ear pain, sore throat, trouble swallowing, neck pain and ear discharge.   Eyes: Negative for photophobia, discharge and visual disturbance.  Respiratory: Negative for cough, choking, chest tightness and shortness of breath.   Cardiovascular: Negative for chest pain and palpitations.       Patient walks 90 minutes for about 3 miles without difficulty.  No exertional chest/neck/shoulder/arm pain.   Gastrointestinal: Positive for nausea. Negative for vomiting, abdominal pain, diarrhea, constipation, blood in stool, abdominal distention, anal bleeding and rectal pain.  Genitourinary: Negative for dysuria, frequency and difficulty urinating.  Musculoskeletal: Negative for myalgias and gait problem.  Skin: Negative for color change, pallor and rash.  Neurological: Negative for dizziness, speech difficulty, weakness and numbness.  Hematological: Negative for adenopathy.  Psychiatric/Behavioral: Negative for confusion and agitation. The patient is not nervous/anxious.        Objective:   Physical Exam  Constitutional: She is oriented to person, place, and time. She appears well-developed and well-nourished. No distress.  HENT:  Head: Normocephalic.  Mouth/Throat: Oropharynx is clear and moist. No oropharyngeal exudate.  Eyes: Conjunctivae and EOM are normal. Pupils are equal, round, and reactive to  light. No scleral icterus.  Neck: Normal range of motion. Neck supple. No tracheal deviation present.  Cardiovascular: Normal rate, regular rhythm and intact distal pulses.   Pulmonary/Chest: Effort normal and breath sounds normal. No respiratory distress. She exhibits no tenderness.  Abdominal: Soft. She exhibits no distension and no mass. There is no tenderness. There is no rebound and no guarding. Hernia confirmed negative in the right inguinal area and confirmed negative in the left inguinal area.  Genitourinary: No vaginal discharge found.  Musculoskeletal: Normal range of motion. She exhibits no tenderness.  Lymphadenopathy:    She has no cervical adenopathy.       Right: No inguinal adenopathy present.       Left: No inguinal adenopathy present.  Neurological: She is alert and oriented to person, place, and time. No cranial nerve deficit. She exhibits normal muscle  tone. Coordination normal.  Skin: Skin is warm and dry. No rash noted. She is not diaphoretic. No erythema.  Psychiatric: She has a normal mood and affect. Her behavior is normal. Judgment and thought content normal.   No results found for this or any previous visit (from the past 72 hour(s)).  Clinical Data: Nausea.  COMPLETE ABDOMINAL ULTRASOUND  Comparison: None.  Findings:  Gallbladder: The multiple mobile small gallstones. No wall  thickening. Negative sonographic Murphy's.  Common bile duct: Normal caliber, 2 mm.  Liver: No focal lesion identified. Within normal limits in  parenchymal echogenicity.  IVC: Appears normal.  Pancreas: No focal abnormality seen.  Spleen: Within normal limits in size and echotexture.  Right Kidney: Normal in size and parenchymal echogenicity. No  evidence of mass or hydronephrosis.  Left Kidney: Normal in size and parenchymal echogenicity. No  evidence of mass or hydronephrosis.  Abdominal aorta: No aneurysm identified.  IMPRESSION:  Cholelithiasis. No evidence of acute  cholecystitis.  Original Report Authenticated By: Cyndie Chime, M.D.     Assessment:     Incidental gallstones with mild nausea.  No evidence of biliary colic.    Plan:     I noted that is because she has gallstones, it does not warrant surgery.  She has no episodes of biliary colic.  While I think that her risk of surgery is low, it is not zero.  I think the risks of an attack are less than the risks of surgery and therefore did not recommend surgery at this time.  She feels reassured.  She seem to be not wanting to have an operation unless really needed.  She feels reassured  I don't see a benefit in continuing the ursodiol/Actigall.  Odd that it will dissolve the stones are poor, and she would need to take the rest of her life.    Consider when necessary Phenergan if nausea worsens and a further workup for that.  She notes it's only very mild and not severe.  Low fat high fiber diet can help protect her against further attacks as well.

## 2012-06-02 ENCOUNTER — Ambulatory Visit (INDEPENDENT_AMBULATORY_CARE_PROVIDER_SITE_OTHER): Payer: Commercial Managed Care - PPO | Admitting: Internal Medicine

## 2012-06-02 ENCOUNTER — Encounter: Payer: Self-pay | Admitting: Internal Medicine

## 2012-06-02 VITALS — BP 106/68 | HR 78 | Temp 98.5°F | Resp 16 | Wt 193.2 lb

## 2012-06-02 DIAGNOSIS — R350 Frequency of micturition: Secondary | ICD-10-CM

## 2012-06-02 DIAGNOSIS — M545 Low back pain, unspecified: Secondary | ICD-10-CM

## 2012-06-02 DIAGNOSIS — N23 Unspecified renal colic: Secondary | ICD-10-CM

## 2012-06-02 DIAGNOSIS — R309 Painful micturition, unspecified: Secondary | ICD-10-CM

## 2012-06-02 DIAGNOSIS — N390 Urinary tract infection, site not specified: Secondary | ICD-10-CM

## 2012-06-02 LAB — POCT URINALYSIS DIPSTICK
Bilirubin, UA: NEGATIVE
Nitrite, UA: NEGATIVE
pH, UA: 6

## 2012-06-02 MED ORDER — CIPROFLOXACIN HCL 500 MG PO TABS: 500.0000 mg | ORAL_TABLET | Freq: Two times a day (BID) | ORAL | Status: DC

## 2012-06-02 NOTE — Assessment & Plan Note (Signed)
UA with small leukocyte esterase and trace blood. Begin course of Cipro.Followup if no improvement or worsening.

## 2012-06-02 NOTE — Progress Notes (Signed)
  Subjective:    Patient ID: Shannon Hale, female    DOB: 1981/08/26, 31 y.o.   MRN: [id number]  HPI Pt presents to clinic for evaluation of possible UTI. Notes today history of urinary pressure, urinary frequency mild low back pain now resolved and pain with urination. No hematuria fever nausea or vomiting. No history of recurrent UTIs. No alleviating or exacerbating factors. No other complaints.  Past Medical History  Diagnosis Date  . Abnormal Pap smear   . History of chicken pox   . Nausea    Past Surgical History  Procedure Date  . Tonsillectomy   . Septoplasty   . Wisdom tooth extraction   . No past surgeries     reports that she has never smoked. She has never used smokeless tobacco. She reports that she drinks about .6 ounces of alcohol per week. She reports that she does not use illicit drugs. family history includes Cancer in her maternal grandmother, mother, and paternal grandfather; Depression in her maternal grandmother; Diabetes in her maternal grandmother, paternal grandmother, and paternal uncle; Heart disease in her father and paternal grandfather; Hypertension in her maternal grandmother; and Seizures in her brother. No Known Allergies   Review of Systems see hpi     Objective:   Physical Exam  Nursing note and vitals reviewed. Constitutional: She appears well-developed and well-nourished.          Assessment & Plan:

## 2012-07-20 ENCOUNTER — Ambulatory Visit (INDEPENDENT_AMBULATORY_CARE_PROVIDER_SITE_OTHER): Payer: 59 | Admitting: Internal Medicine

## 2012-07-20 ENCOUNTER — Encounter: Payer: Self-pay | Admitting: Internal Medicine

## 2012-07-20 VITALS — BP 118/72 | HR 85 | Temp 98.1°F | Resp 16 | Wt 195.0 lb

## 2012-07-20 DIAGNOSIS — Z79899 Other long term (current) drug therapy: Secondary | ICD-10-CM

## 2012-07-20 DIAGNOSIS — G43909 Migraine, unspecified, not intractable, without status migrainosus: Secondary | ICD-10-CM

## 2012-07-20 DIAGNOSIS — Z13220 Encounter for screening for lipoid disorders: Secondary | ICD-10-CM

## 2012-07-20 MED ORDER — SUMATRIPTAN SUCCINATE 50 MG PO TABS: 50.0000 mg | ORAL_TABLET | ORAL | Status: DC | PRN

## 2012-07-24 NOTE — Progress Notes (Signed)
  Subjective:    Patient ID: Shannon Hale, female    DOB: 1980/12/21, 31 y.o.   MRN: [id number]  HPI Pt presents to clinic for followup of multiple medical problems. History of gallstones and asymptomatic. Status post surgery consult without recommendation for cholecystectomy. Does have history of migraine headaches that seem to occur with menstrual cycle. Maxalt has previously worked to abort headaches however has become less effective most recently. Headaches are typically left retro-orbital with associated nausea and sound sensitivity.  Past Medical History  Diagnosis Date  . Abnormal Pap smear   . History of chicken pox   . Nausea    Past Surgical History  Procedure Date  . Tonsillectomy   . Septoplasty   . Wisdom tooth extraction   . No past surgeries     reports that she has never smoked. She has never used smokeless tobacco. She reports that she drinks about .6 ounces of alcohol per week. She reports that she does not use illicit drugs. family history includes Cancer in her maternal grandmother, mother, and paternal grandfather; Depression in her maternal grandmother; Diabetes in her maternal grandmother, paternal grandmother, and paternal uncle; Heart disease in her father and paternal grandfather; Hypertension in her maternal grandmother; and Seizures in her brother. No Known Allergies    Review of Systems see hpi     Objective:   Physical Exam  Nursing note and vitals reviewed. Constitutional: She appears well-developed and well-nourished. No distress.  HENT:  Head: Normocephalic and atraumatic.  Eyes: Conjunctivae normal are normal. No scleral icterus.  Neurological: She is alert.  Skin: She is not diaphoretic.  Psychiatric: She has a normal mood and affect.          Assessment & Plan:

## 2012-07-24 NOTE — Assessment & Plan Note (Signed)
Failing Maxalt. Attempt Imitrex when necessary. No current indication for prophylaxis.

## 2012-08-17 ENCOUNTER — Ambulatory Visit (INDEPENDENT_AMBULATORY_CARE_PROVIDER_SITE_OTHER): Payer: 59 | Admitting: Family

## 2012-08-17 ENCOUNTER — Encounter: Payer: Self-pay | Admitting: Family

## 2012-08-17 VITALS — BP 110/70 | HR 105 | Temp 98.8°F | Resp 18 | Wt 191.1 lb

## 2012-08-17 DIAGNOSIS — J329 Chronic sinusitis, unspecified: Secondary | ICD-10-CM | POA: Insufficient documentation

## 2012-08-17 DIAGNOSIS — G43909 Migraine, unspecified, not intractable, without status migrainosus: Secondary | ICD-10-CM

## 2012-08-17 MED ORDER — AMOXICILLIN-POT CLAVULANATE 875-125 MG PO TABS: 1.0000 | ORAL_TABLET | Freq: Two times a day (BID) | ORAL | Status: DC

## 2012-08-17 MED ORDER — SUMATRIPTAN SUCCINATE 100 MG PO TABS: ORAL_TABLET | ORAL | Status: DC

## 2012-08-17 NOTE — Assessment & Plan Note (Signed)
Will rx with Augmentin- she is reminded to use back up birth control as abx may interfere with OCP.

## 2012-08-17 NOTE — Patient Instructions (Addendum)
Please call if sinus symptoms worsen or if no improvement in 2-3 days.  Let us know if migraine relief is not better on the higher dose of imitrex.  Sinusitis Sinusitis is redness, soreness, and swelling (inflammation) of the paranasal sinuses. Paranasal sinuses are air pockets within the bones of your face (beneath the eyes, the middle of the forehead, or above the eyes). In healthy paranasal sinuses, mucus is able to drain out, and air is able to circulate through them by way of your nose. However, when your paranasal sinuses are inflamed, mucus and air can become trapped. This can allow bacteria and other germs to grow and cause infection. Sinusitis can develop quickly and last only a short time (acute) or continue over a long period (chronic). Sinusitis that lasts for more than 12 weeks is considered chronic.  CAUSES  Causes of sinusitis include:  Allergies.  Structural abnormalities, such as displacement of the cartilage that separates your nostrils (deviated septum), which can decrease the air flow through your nose and sinuses and affect sinus drainage.  Functional abnormalities, such as when the small hairs (cilia) that line your sinuses and help remove mucus do not work properly or are not present. SYMPTOMS  Symptoms of acute and chronic sinusitis are the same. The primary symptoms are pain and pressure around the affected sinuses. Other symptoms include:  Upper toothache.  Earache.  Headache.  Bad breath.  Decreased sense of smell and taste.  A cough, which worsens when you are lying flat.  Fatigue.  Fever.  Thick drainage from your nose, which often is green and may contain pus (purulent).  Swelling and warmth over the affected sinuses. DIAGNOSIS  Your caregiver will perform a physical exam. During the exam, your caregiver may:  Look in your nose for signs of abnormal growths in your nostrils (nasal polyps).  Tap over the affected sinus to check for signs of  infection.  View the inside of your sinuses (endoscopy) with a special imaging device with a light attached (endoscope), which is inserted into your sinuses. If your caregiver suspects that you have chronic sinusitis, one or more of the following tests may be recommended:  Allergy tests.  Nasal culture A sample of mucus is taken from your nose and sent to a lab and screened for bacteria.  Nasal cytology A sample of mucus is taken from your nose and examined by your caregiver to determine if your sinusitis is related to an allergy. TREATMENT  Most cases of acute sinusitis are related to a viral infection and will resolve on their own within 10 days. Sometimes medicines are prescribed to help relieve symptoms (pain medicine, decongestants, nasal steroid sprays, or saline sprays).  However, for sinusitis related to a bacterial infection, your caregiver will prescribe antibiotic medicines. These are medicines that will help kill the bacteria causing the infection.  Rarely, sinusitis is caused by a fungal infection. In theses cases, your caregiver will prescribe antifungal medicine. For some cases of chronic sinusitis, surgery is needed. Generally, these are cases in which sinusitis recurs more than 3 times per year, despite other treatments. HOME CARE INSTRUCTIONS   Drink plenty of water. Water helps thin the mucus so your sinuses can drain more easily.  Use a humidifier.  Inhale steam 3 to 4 times a day (for example, sit in the bathroom with the shower running).  Apply a warm, moist washcloth to your face 3 to 4 times a day, or as directed by your caregiver.  Use  saline nasal sprays to help moisten and clean your sinuses.  Take over-the-counter or prescription medicines for pain, discomfort, or fever only as directed by your caregiver. SEEK IMMEDIATE MEDICAL CARE IF:  You have increasing pain or severe headaches.  You have nausea, vomiting, or drowsiness.  You have swelling around your  face.  You have vision problems.  You have a stiff neck.  You have difficulty breathing. MAKE SURE YOU:   Understand these instructions.  Will watch your condition.  Will get help right away if you are not doing well or get worse. Document Released: 08/19/2005 Document Revised: 11/11/2011 Document Reviewed: 09/03/2011 [facility] Patient Information 2013 [place], [place].

## 2012-08-17 NOTE — Progress Notes (Signed)
Subjective:    Patient ID: Shannon Hale, female    DOB: 1980-10-25, 31 y.o.   MRN: [id number]  HPI  Shannon Hale is a 31 yr old female who presents today with two concerns:  1) nasal congestion- Symptoms started early last week.  Over weekend developed facial pressure.  Yellow nasal discharge, associated cough, denies associated fever and has tried sudafed which did not help much.  2) Migraines- has been using imitrex.  She used to take maxalt, but wasn't working. She was started on imitrex 50mg .  She reports migraines generally occur the week of her period every other day during that week.    Review of Systems See HPI  Past Medical History  Diagnosis Date  . Abnormal Pap smear   . History of chicken pox   . Nausea     History   Social History  . Marital Status: Married    Spouse Name: N/A    Number of Children: N/A  . Years of Education: N/A   Occupational History  . Not on file.   Social History Main Topics  . Smoking status: Never Smoker   . Smokeless tobacco: Never Used  . Alcohol Use: 0.6 oz/week    1 Glasses of wine per week  . Drug Use: No  . Sexually Active: Yes   Other Topics Concern  . Not on file   Social History Narrative  . No narrative on file    Past Surgical History  Procedure Date  . Tonsillectomy   . Septoplasty   . Wisdom tooth extraction   . No past surgeries     Family History  Problem Relation Age of Onset  . Cancer Mother     breast, leaukemia  . Heart disease Father   . Seizures Brother   . Diabetes Paternal Uncle   . Hypertension Maternal Grandmother   . Diabetes Maternal Grandmother   . Depression Maternal Grandmother     anxiety also  . Cancer Maternal Grandmother     breast  . Diabetes Paternal Grandmother   . Heart disease Paternal Grandfather   . Cancer Paternal Grandfather     colon    No Known Allergies  Current Outpatient Prescriptions on File Prior to Visit  Medication Sig Dispense Refill  .  drospirenone-ethinyl estradiol (YASMIN,ZARAH,SYEDA) 3-0.03 MG tablet Take 1 tablet by mouth daily.      . Multiple Vitamin (MULTIVITAMIN) tablet Take 1 tablet by mouth daily.      . SUMAtriptan (IMITREX) 50 MG tablet Take 1 tablet (50 mg total) by mouth every 2 (two) hours as needed for migraine.  10 tablet  6  . Prenatal Vit-Fe Fumarate-FA (PRENATAL MULTIVITAMIN) TABS Take 1 tablet by mouth daily.        . ursodiol (ACTIGALL) 300 MG capsule Take 1 capsule (300 mg total) by mouth 2 (two) times daily.  60 capsule  5    BP 110/70  Pulse 105  Temp 98.8 F (37.1 C) (Oral)  Resp 18  Wt 191 lb 1.9 oz (86.691 kg)  SpO2 99%  LMP 07/14/2012       Objective:   Physical Exam  Constitutional: She is oriented to person, place, and time. She appears well-developed and well-nourished.  HENT:  Head: Normocephalic and atraumatic.  Right Ear: Tympanic membrane and ear canal normal.  Left Ear: Tympanic membrane and ear canal normal.  Mouth/Throat: No posterior oropharyngeal edema or posterior oropharyngeal erythema.       + sinus tenderness  to palpation  Cardiovascular: Normal rate and regular rhythm.   No murmur heard. Pulmonary/Chest: Effort normal and breath sounds normal. No respiratory distress. She has no wheezes. She has no rales. She exhibits no tenderness.  Musculoskeletal: She exhibits no edema.  Lymphadenopathy:    She has no cervical adenopathy.  Neurological: She is alert and oriented to person, place, and time.  Psychiatric: She has a normal mood and affect. Her behavior is normal. Judgment and thought content normal.          Assessment & Plan:

## 2012-08-17 NOTE — Assessment & Plan Note (Signed)
Unchanged,  Will increase imitrex from 50mg  to 100mg .

## 2012-10-17 ENCOUNTER — Other Ambulatory Visit: Payer: Self-pay

## 2012-10-30 ENCOUNTER — Other Ambulatory Visit: Payer: Self-pay | Admitting: Family

## 2013-01-22 ENCOUNTER — Telehealth: Payer: Self-pay | Admitting: Internal Medicine

## 2013-01-22 ENCOUNTER — Other Ambulatory Visit: Payer: Self-pay | Admitting: Family

## 2013-01-22 MED ORDER — SUMATRIPTAN SUCCINATE 100 MG PO TABS: ORAL_TABLET | ORAL | Status: DC

## 2013-01-22 NOTE — Telephone Encounter (Signed)
Patient states that she has been having more headaches and would like another refill of sumatriptan 100mg . She would like multiple refills sent to [facility]. Thanks!

## 2013-01-22 NOTE — Telephone Encounter (Signed)
Rx request to pharmacy; *PATIENT DUE FOR FOLLOW-UP OFFICE VISIT*/SLS    

## 2013-03-01 ENCOUNTER — Ambulatory Visit: Payer: 59 | Admitting: Family Medicine

## 2013-03-08 ENCOUNTER — Ambulatory Visit (INDEPENDENT_AMBULATORY_CARE_PROVIDER_SITE_OTHER): Payer: 59 | Admitting: Internal Medicine

## 2013-03-08 ENCOUNTER — Encounter: Payer: Self-pay | Admitting: Internal Medicine

## 2013-03-08 VITALS — BP 116/70 | HR 83 | Temp 98.4°F | Wt 193.0 lb

## 2013-03-08 DIAGNOSIS — J209 Acute bronchitis, unspecified: Secondary | ICD-10-CM

## 2013-03-08 MED ORDER — FLUTICASONE PROPIONATE 50 MCG/ACT NA SUSP: NASAL | Status: DC

## 2013-03-08 MED ORDER — AZITHROMYCIN 250 MG PO TABS: ORAL_TABLET | ORAL | Status: DC

## 2013-03-08 NOTE — Progress Notes (Signed)
  Subjective:    Patient ID: Shannon Hale, female    DOB: October 12, 1980, 32 y.o.   MRN: [id number]  HPI  Symptoms began 03/06/13 as chest congestion and nonproductive cough. As of 7/6 she began to produce clear-yellow sputum. She's also had some intermittent wheezing without associated shortness of breath.  She has no past medical history of asthma. She smoked for less than 6 months in college. She does have toddler who was diagnosed with bronchiolitis last week.    Review of Systems No extrinsic URI  symptoms present. She does have recurrent rhinitis symptoms but this is not a predominant symptom at this time. She specifically denies frontal headache, facial pain, nasal purulence, dental pain, sore throat, and otic pain, otic discharge.    Objective:   Physical Exam General appearance:good health ;well nourished; no acute distress or increased work of breathing is present.  No  lymphadenopathy about the head, neck, or axilla noted.   Eyes: No conjunctival inflammation or lid edema is present.   Ears:  External ear exam shows no significant lesions or deformities.  Otoscopic examination reveals clear canals, tympanic membranes are intact bilaterally without bulging, retraction, inflammation or discharge.  Nose:  External nasal examination reveals mild inflammation. Nasal mucosa are pink and moist without lesions or exudates. No septal dislocation or deviation.No obstruction to airflow.   Oral exam: Dental hygiene is good; lips and gums are healthy appearing.There is no oropharyngeal erythema or exudate noted.   Neck:  No deformities,  masses, or tenderness noted.      Heart:  Normal rate and regular rhythm. S1 and S2 normal without gallop, murmur, click, rub or other extra sounds.   Lungs:Chest clear to auscultation; no wheezes, rhonchi,rales ,or rubs present.No increased work of breathing.    Extremities:  No cyanosis, edema, or clubbing  noted    Skin: Warm but slightly  damp         Assessment & Plan:  #1 acute bronchitis w/o bronchospasm or acute URI Plan: See orders and recommendations

## 2013-03-08 NOTE — Patient Instructions (Addendum)

## 2013-03-29 ENCOUNTER — Ambulatory Visit: Payer: 59 | Admitting: Family Medicine

## 2013-05-27 ENCOUNTER — Encounter: Payer: Self-pay | Admitting: Physician Assistant

## 2013-05-27 ENCOUNTER — Ambulatory Visit (INDEPENDENT_AMBULATORY_CARE_PROVIDER_SITE_OTHER): Payer: 59 | Admitting: Physician Assistant

## 2013-05-27 VITALS — BP 112/70 | HR 84 | Temp 98.2°F | Resp 16 | Ht 69.02 in | Wt 198.1 lb

## 2013-05-27 DIAGNOSIS — J029 Acute pharyngitis, unspecified: Secondary | ICD-10-CM

## 2013-05-27 MED ORDER — LIDOCAINE VISCOUS 2 % MT SOLN: 15.0000 mL | OROMUCOSAL | Status: DC | PRN

## 2013-05-27 NOTE — Progress Notes (Signed)
Patient ID: Shannon Hale, female   DOB: December 27, 1980, 32 y.o.   MRN: [id number]  Patient presents to clinic today c/o mild scratchy throat first noticed on Monday.  Patient states that the pain has increased to moderate level.  Is now associated with some nasal congestion and sinus drainage. Denies cough, fever, chills, rash, sinus pressure or pain.  Patient denies recent sick contact.  Has taken tylenol and lozenges for throat pain.  Endorses history of allergy with occasional use of Flonase.   Past Medical History  Diagnosis Date  . Abnormal Pap smear   . History of chicken pox   . Nausea     Current Outpatient Prescriptions on File Prior to Visit  Medication Sig Dispense Refill  . drospirenone-ethinyl estradiol (YASMIN,ZARAH,SYEDA) 3-0.03 MG tablet Take 1 tablet by mouth daily.      . fluticasone (FLONASE) 50 MCG/ACT nasal spray 1 spray in each nostril twice a day as needed. Use the "crossover" technique as discussed  16 g  5  . SUMAtriptan (IMITREX) 100 MG tablet One tab at start of migraine, may repeat once 2 hours later as needed. Max 2 tabs/day  9 tablet  1   No current facility-administered medications on file prior to visit.    No Known Allergies  Family History  Problem Relation Age of Onset  . Cancer Mother     breast, leaukemia  . Heart disease Father   . Seizures Brother   . Diabetes Paternal Uncle   . Hypertension Maternal Grandmother   . Diabetes Maternal Grandmother   . Depression Maternal Grandmother     anxiety also  . Cancer Maternal Grandmother     breast  . Diabetes Paternal Grandmother   . Heart disease Paternal Grandfather   . Cancer Paternal Grandfather     colon    History   Social History  . Marital Status: Married    Spouse Name: N/A    Number of Children: N/A  . Years of Education: N/A   Social History Main Topics  . Smoking status: Never Smoker   . Smokeless tobacco: Never Used  . Alcohol Use: No  . Drug Use: No  . Sexual Activity:  Yes   Other Topics Concern  . None   Social History Narrative  . None   ROS See HPI.  All other ROS negative.  Filed Vitals:   05/27/13 0825  BP: 112/70  Pulse: 84  Temp: 98.2 F (36.8 C)  Resp: 16   Physical Exam  Vitals reviewed. Constitutional: She is oriented to person, place, and time and well-developed, well-nourished, and in no distress.  HENT:  Head: Normocephalic and atraumatic.  Right Ear: External ear normal.  Left Ear: External ear normal.  Nose: Nose normal.  Mouth/Throat: No oropharyngeal exudate.  Mild posterior oropharyngeal erythema without exudate or petechiae.  TM WNL bilaterally.  No tenderness to percussion of frontal or maxillary sinuses.   Eyes: Conjunctivae are normal.  Neck: Neck supple.  Cardiovascular: Normal rate, regular rhythm and normal heart sounds.   Pulmonary/Chest: Effort normal and breath sounds normal.  Lymphadenopathy:    She has no cervical adenopathy.  Neurological: She is alert and oriented to person, place, and time.  Skin: Skin is warm and dry. No rash noted.   Recent Results (from the past 2160 hour(s))  POCT RAPID STREP A (OFFICE)     Status: None   Collection Time    05/27/13  8:37 AM      Result  Value Range   Rapid Strep A Screen Negative  Negative    Assessment/Plan: Acute viral pharyngitis Rapid Strep negative. Rest. Hydration.  Rx Viscous lidocaine.  Tylenol for pain.  Increase fluid intake.  Daily saline nasal spray.  Probable allergic component as well -- Flonase and daily zyrtec.

## 2013-05-27 NOTE — Assessment & Plan Note (Addendum)
Rapid Strep negative. Rest. Hydration.  Rx Viscous lidocaine.  Tylenol for pain.  Increase fluid intake.  Daily saline nasal spray.  Probable allergic component as well -- Flonase and daily zyrtec.

## 2013-05-27 NOTE — Patient Instructions (Signed)
Please drink plenty of fluids and get plenty of rest.  Warm liquids are usually more soothing to the throat.  Use saline nasal spray.  Daily claritin or zyrtec.  Use lidocaine solution to help soothe throat.  Please call or return to clinic if symptoms are not improving.   Sore Throat A sore throat is pain, burning, irritation, or scratchiness of the throat. There is often pain or tenderness when swallowing or talking. A sore throat may be accompanied by other symptoms, such as coughing, sneezing, fever, and swollen neck glands. A sore throat is often the first sign of another sickness, such as a cold, flu, strep throat, or mononucleosis (commonly known as mono). Most sore throats go away without medical treatment. CAUSES  The most common causes of a sore throat include:  A viral infection, such as a cold, flu, or mono.  A bacterial infection, such as strep throat, tonsillitis, or whooping cough.  Seasonal allergies.  Dryness in the air.  Irritants, such as smoke or pollution.  Gastroesophageal reflux disease (GERD). HOME CARE INSTRUCTIONS   Only take over-the-counter medicines as directed by your caregiver.  Drink enough fluids to keep your urine clear or pale yellow.  Rest as needed.  Try using throat sprays, lozenges, or sucking on hard candy to ease any pain (if older than 4 years or as directed).  Sip warm liquids, such as broth, herbal tea, or warm water with honey to relieve pain temporarily. You may also eat or drink cold or frozen liquids such as frozen ice pops.  Gargle with salt water (mix 1 tsp salt with 8 oz of water).  Do not smoke and avoid secondhand smoke.  Put a cool-mist humidifier in your bedroom at night to moisten the air. You can also turn on a hot shower and sit in the bathroom with the door closed for 5 10 minutes. SEEK IMMEDIATE MEDICAL CARE IF:  You have difficulty breathing.  You are unable to swallow fluids, soft foods, or your saliva.  You have  increased swelling in the throat.  Your sore throat does not get better in 7 days.  You have nausea and vomiting.  You have a fever or persistent symptoms for more than 2 3 days.  You have a fever and your symptoms suddenly get worse. MAKE SURE YOU:   Understand these instructions.  Will watch your condition.  Will get help right away if you are not doing well or get worse. Document Released: 09/26/2004 Document Revised: 08/05/2012 Document Reviewed: 04/26/2012 [facility] Patient Information 2014 [place], [place].

## 2013-07-08 ENCOUNTER — Other Ambulatory Visit: Payer: Self-pay

## 2013-09-27 ENCOUNTER — Emergency Department
Admission: EM | Admit: 2013-09-27 | Discharge: 2013-09-27 | Disposition: A | Discharge status: Home / Self Care | Payer: 59 | Source: Home / Self Care | Attending: Family Medicine | Admitting: Family Medicine

## 2013-09-27 ENCOUNTER — Encounter: Payer: Self-pay | Admitting: Emergency Medicine

## 2013-09-27 DIAGNOSIS — R197 Diarrhea, unspecified: Secondary | ICD-10-CM

## 2013-09-27 DIAGNOSIS — R112 Nausea with vomiting, unspecified: Secondary | ICD-10-CM

## 2013-09-27 HISTORY — DX: Inflammatory liver disease, unspecified: K75.9

## 2013-09-27 LAB — POCT CBC W AUTO DIFF (K'VILLE URGENT CARE)

## 2013-09-27 MED ORDER — ONDANSETRON 8 MG PO TBDP: ORAL_TABLET | ORAL | Status: DC

## 2013-09-27 MED ORDER — PROMETHAZINE HCL 25 MG/ML IJ SOLN
25.0000 mg | Freq: Once | INTRAMUSCULAR | Status: AC
  Administered 2013-09-27: 25 mg via INTRAMUSCULAR

## 2013-09-27 MED ORDER — SODIUM CHLORIDE 0.9 % IV BOLUS (SEPSIS)
1000.0000 mL | Freq: Once | INTRAVENOUS | Status: AC
  Administered 2013-09-27: 1000 mL via INTRAVENOUS

## 2013-09-27 NOTE — Discharge Instructions (Signed)
Begin clear liquids (Pedialyte while having diarrhea) until improved, then advance to a BRAT diet.  Then gradually resume a regular diet when tolerated.  Avoid milk products until well.  To decrease diarrhea, mix one heaping tablespoon Citrucel (methylcellulose) in 8 oz water and drink one to three times daily.  When stools become more formed, may take Imodium (loperamide) once or twice daily to decrease stool frequency.  If symptoms become significantly worse during the night or over the weekend, proceed to the local emergency room.

## 2013-09-27 NOTE — ED Notes (Signed)
Vomiting and diarrhea since yesterday.

## 2013-09-27 NOTE — ED Provider Notes (Signed)
CSN: [id number]     Arrival date & time 09/27/13  0932 History   First MD Initiated Contact with Patient 09/27/13 1022     Chief Complaint  Patient presents with  . Emesis     HPI Comments: Yesterday afternoon patient developed non-radiating epigastric pain followed by nausea, then watery diarrhea, then intermittent vomiting.  No fevers, chills, and sweats. No GU symptoms.  Her symptoms have persisted and she is unable to take fluids. She has a history of gallstones, having had a flare-up of right upper quadrant pain two years ago.  At that time her epigastric pain radiated to her back and right posterior shoulder. Denies recent foreign travel, or drinking untreated water in a wilderness environment.  No hematochezia.  Patient is a 33 y.o. female presenting with vomiting. The history is provided by the patient.  Emesis Severity:  Moderate Duration:  1 day Timing:  Constant Quality:  Stomach contents Progression:  Unchanged Chronicity:  New Recent urination:  Decreased Relieved by:  Nothing Worsened by:  Food smell Ineffective treatments:  None tried Associated symptoms: abdominal pain and diarrhea   Associated symptoms: no arthralgias, no chills, no cough, no fever, no headaches, no myalgias, no sore throat and no URI     Past Medical History  Diagnosis Date  . Abnormal Pap smear   . History of chicken pox   . Nausea   . Hepatitis    Past Surgical History  Procedure Laterality Date  . Tonsillectomy    . Septoplasty    . Wisdom tooth extraction    . No past surgeries     Family History  Problem Relation Age of Onset  . Cancer Mother     breast, leaukemia  . Heart disease Father   . Seizures Brother   . Diabetes Paternal Uncle   . Hypertension Maternal Grandmother   . Diabetes Maternal Grandmother   . Depression Maternal Grandmother     anxiety also  . Cancer Maternal Grandmother     breast  . Diabetes Paternal Grandmother   . Heart disease Paternal Grandfather    . Cancer Paternal Grandfather     colon   History  Substance Use Topics  . Smoking status: Never Smoker   . Smokeless tobacco: Never Used  . Alcohol Use: No   OB History   Grav Para Term Preterm Abortions TAB SAB Ect Mult Living   2 1 1  1  1   1      Review of Systems  Constitutional: Negative for chills.  HENT: Negative for sore throat.   Gastrointestinal: Positive for vomiting, abdominal pain and diarrhea.  Musculoskeletal: Negative for arthralgias and myalgias.  Neurological: Negative for headaches.  All other systems reviewed and are negative.    Allergies  Review of patient's allergies indicates not on file.  Home Medications   Current Outpatient Rx  Name  Route  Sig  Dispense  Refill  . drospirenone-ethinyl estradiol (YASMIN,ZARAH,SYEDA) 3-0.03 MG tablet   Oral   Take 1 tablet by mouth daily.         . fluticasone (FLONASE) 50 MCG/ACT nasal spray      1 spray in each nostril twice a day as needed. Use the "crossover" technique as discussed   16 g   5   . lidocaine (XYLOCAINE) 2 % solution   Oral   Take 15 mLs by mouth as needed for pain.   100 mL   0   . ondansetron (ZOFRAN ODT)  8 MG disintegrating tablet      Take one tab by mouth Q6hr prn nausea   12 tablet   0   . SUMAtriptan (IMITREX) 100 MG tablet      One tab at start of migraine, may repeat once 2 hours later as needed. Max 2 tabs/day   9 tablet   1    BP 115/79  Pulse 92  Temp(Src) 98.3 F (36.8 C) (Oral)  Ht 5\' 9"  (1.753 m)  Wt 200 lb (90.719 kg)  BMI 29.52 kg/m2  SpO2 97% Physical Exam Nursing notes and Vital Signs reviewed. Appearance:  Patient appears healthy, stated age, and in no acute distress.  She is alert. Eyes:  Pupils are equal, round, and reactive to light and accomodation.  Extraocular movement is intact.  Conjunctivae are not inflamed  Ears:  Canals normal.  Tympanic membranes normal.  Nose:  Normal Pharynx:  Normal; decreased moisture Neck:  Supple.  No  adenopathy Lungs:  Clear to auscultation.  Breath sounds are equal.  Heart:  Regular rate and rhythm without murmurs, rubs, or gallops.  Abdomen:  Mild sub-xiphoid tenderness without masses or hepatosplenomegaly.  Bowel sounds are present but decreased.  No CVA or flank tenderness.  Extremities:  No edema.  No calf tenderness Skin:  No rash present.   ED Course  Procedures  IV fluids one liter NS.  Patient felt considerably better after fluids. Labs Review Labs Reviewed  POCT CBC W AUTO DIFF ([facility]):  WBC 6.2; LY 13.6; MO 4.3; GR 82.1; Hgb 13.9; Platelets 205          MDM   1. Nausea with vomiting; suspect viral gastroenteritis (note normal white blood count).  Nausea resolved after Phenergan IM and Zofran 8mg  PO   2. Diarrhea    Rx given for Zofran ODT 8mg  Begin clear liquids (Pedialyte while having diarrhea) until improved, then advance to a BRAT diet.  Then gradually resume a regular diet when tolerated.  Avoid milk products until well.  To decrease diarrhea, mix one heaping tablespoon Citrucel (methylcellulose) in 8 oz water and drink one to three times daily.  When stools become more formed, may take Imodium (loperamide) once or twice daily to decrease stool frequency.  Followup with Family Doctor if not improved in 3 days. If symptoms become significantly worse during the night or over the weekend, proceed to the local emergency room   Kandra Nicolas, MD 09/27/13 1215

## 2013-10-04 ENCOUNTER — Encounter: Payer: Self-pay | Admitting: Family

## 2013-10-04 ENCOUNTER — Ambulatory Visit (HOSPITAL_BASED_OUTPATIENT_CLINIC_OR_DEPARTMENT_OTHER)
Admission: RE | Admit: 2013-10-04 | Discharge: 2013-10-04 | Disposition: A | Discharge status: Home / Self Care | Payer: 59 | Source: Ambulatory Visit | Attending: Family | Admitting: Family

## 2013-10-04 ENCOUNTER — Ambulatory Visit (INDEPENDENT_AMBULATORY_CARE_PROVIDER_SITE_OTHER): Payer: 59 | Admitting: Family

## 2013-10-04 VITALS — BP 110/78 | HR 110 | Temp 97.8°F | Resp 16 | Ht 69.0 in | Wt 203.0 lb

## 2013-10-04 DIAGNOSIS — R1011 Right upper quadrant pain: Secondary | ICD-10-CM | POA: Insufficient documentation

## 2013-10-04 DIAGNOSIS — K8020 Calculus of gallbladder without cholecystitis without obstruction: Secondary | ICD-10-CM | POA: Insufficient documentation

## 2013-10-04 DIAGNOSIS — N281 Cyst of kidney, acquired: Secondary | ICD-10-CM | POA: Insufficient documentation

## 2013-10-04 DIAGNOSIS — R110 Nausea: Secondary | ICD-10-CM | POA: Insufficient documentation

## 2013-10-04 LAB — HEPATIC FUNCTION PANEL
ALK PHOS: 69 U/L (ref 39–117)
ALT: 29 U/L (ref 0–35)
AST: 28 U/L (ref 0–37)
Albumin: 4.5 g/dL (ref 3.5–5.2)
BILIRUBIN DIRECT: 0.1 mg/dL (ref 0.0–0.3)
BILIRUBIN INDIRECT: 0.2 mg/dL (ref 0.2–1.2)
TOTAL PROTEIN: 7.9 g/dL (ref 6.0–8.3)
Total Bilirubin: 0.3 mg/dL (ref 0.2–1.2)

## 2013-10-04 NOTE — Assessment & Plan Note (Addendum)
Deteriorated. Obtain ultrasound and LFT's prior to surgeon consult. Consult requested for evaluation of gall bladder.

## 2013-10-04 NOTE — Progress Notes (Signed)
Pre visit review using our clinic review tool, if applicable. No additional management support is needed unless otherwise documented below in the visit note. 

## 2013-10-04 NOTE — Progress Notes (Signed)
Subjective:    Patient ID: Shannon Hale, female    DOB: [birth year], 33 y.o.   MRN: [id number]  HPI [name] is a 33 year old female who presents today with a chief complaint of right upper quadrant abdominal pain intermittently x 2 years with current episode of nausea and pain consistently x 1 month. Patient reports pain radiatites to upper right back and describes pain as dull and coliky. Reports nausea, denies vomiting and constipation. Patient reports pain is neither better or worse with any intervention. Patient had ultrasound two years ago. Denies symptoms today.   Review of Systems  Constitutional: Negative for fever and appetite change.  Gastrointestinal: Positive for nausea and abdominal pain. Negative for vomiting, diarrhea, constipation and abdominal distention.  Genitourinary: Negative for difficulty urinating.  Neurological: Negative for dizziness and weakness.  Psychiatric/Behavioral: Positive for self-injury.   Past Medical History  Diagnosis Date  . Abnormal Pap smear   . History of chicken pox   . Nausea   . Hepatitis     History   Social History  . Marital Status: Married    Spouse Name: N/A    Number of Children: N/A  . Years of Education: N/A   Occupational History  . Not on file.   Social History Main Topics  . Smoking status: Never Smoker   . Smokeless tobacco: Never Used  . Alcohol Use: No  . Drug Use: No  . Sexual Activity: Yes   Other Topics Concern  . Not on file   Social History Narrative  . No narrative on file    Past Surgical History  Procedure Laterality Date  . Tonsillectomy    . Septoplasty    . Wisdom tooth extraction    . No past surgeries      Family History  Problem Relation Age of Onset  . Cancer Mother     breast, leaukemia  . Heart disease Father   . Seizures Brother   . Diabetes Paternal Uncle   . Hypertension Maternal Grandmother   . Diabetes Maternal Grandmother   . Depression Maternal Grandmother     anxiety also  . Cancer Maternal Grandmother     breast  . Diabetes Paternal Grandmother   . Heart disease Paternal Grandfather   . Cancer Paternal Grandfather     colon    No Known Allergies  Current Outpatient Prescriptions on File Prior to Visit  Medication Sig Dispense Refill  . drospirenone-ethinyl estradiol (YASMIN,ZARAH,SYEDA) 3-0.03 MG tablet Take 1 tablet by mouth daily.      . SUMAtriptan (IMITREX) 100 MG tablet One tab at start of migraine, may repeat once 2 hours later as needed. Max 2 tabs/day  9 tablet  1   No current facility-administered medications on file prior to visit.    BP 110/78  Pulse 110  Temp(Src) 97.8 F (36.6 C) (Oral)  Resp 16  Ht 5\' 9"  (1.753 m)  Wt 203 lb (92.08 kg)  BMI 29.96 kg/m2  SpO2 98%        Objective:   Physical Exam  Constitutional: She is oriented to person, place, and time. She appears well-nourished.  HENT:  Head: Normocephalic.  Neck: Neck supple.  Cardiovascular: Normal rate and regular rhythm.   Pulmonary/Chest: Effort normal and breath sounds normal. No respiratory distress.  Abdominal: Soft. Bowel sounds are normal. She exhibits no distension and no mass. There is no tenderness.  Lymphadenopathy:    She has no cervical adenopathy.  Neurological: She  is alert and oriented to person, place, and time.  Skin: Skin is warm and dry.  Psychiatric: She has a normal mood and affect.          Assessment & Plan:  I have personally seen and examined patient and agree with Alma Friendly NP student's assessment and plan.

## 2013-10-04 NOTE — Patient Instructions (Signed)
Please complete lab work. You will be contacted for your referral to a surgeon for your gall bladder. Schedule your ultrasound on the first floor.

## 2013-10-11 ENCOUNTER — Encounter (INDEPENDENT_AMBULATORY_CARE_PROVIDER_SITE_OTHER): Payer: Self-pay | Admitting: Surgery

## 2013-10-11 ENCOUNTER — Ambulatory Visit (INDEPENDENT_AMBULATORY_CARE_PROVIDER_SITE_OTHER): Payer: Commercial Managed Care - PPO | Admitting: Surgery

## 2013-10-11 VITALS — BP 120/72 | HR 96 | Temp 98.5°F | Resp 14 | Ht 69.0 in | Wt 206.2 lb

## 2013-10-11 DIAGNOSIS — K8020 Calculus of gallbladder without cholecystitis without obstruction: Secondary | ICD-10-CM

## 2013-10-11 NOTE — Progress Notes (Signed)
Patient ID: Shannon Hale, female   DOB: 1980/09/26, 33 y.o.   MRN: [id number]  Chief Complaint  Patient presents with  . New Evaluation    eval cholelithiasis    HPI Shannon Hale is a 33 y.o. female.   HPI This is a very pleasant female referred to me by Debbrah Alar NP for evaluation of systematic cholelithiasis. She's been having attacks of right quadrant abdominal pain found fullness after fatty meals for possible one month. She had one episode of emesis. She had no stone confirming gallstones. Today she is currently pain-free and is without complaints. Past Medical History  Diagnosis Date  . Abnormal Pap smear   . History of chicken pox   . Nausea   . Hepatitis     Past Surgical History  Procedure Laterality Date  . Tonsillectomy    . Septoplasty    . Wisdom tooth extraction    . No past surgeries      Family History  Problem Relation Age of Onset  . Cancer Mother     breast, leaukemia  . Heart disease Father   . Seizures Brother   . Diabetes Paternal Uncle   . Hypertension Maternal Grandmother   . Diabetes Maternal Grandmother   . Depression Maternal Grandmother     anxiety also  . Cancer Maternal Grandmother     breast  . Diabetes Paternal Grandmother   . Heart disease Paternal Grandfather   . Cancer Paternal Grandfather     colon  . Cancer Maternal Aunt     cervical    Social History History  Substance Use Topics  . Smoking status: Former Smoker    Quit date: 10/11/2002  . Smokeless tobacco: Never Used  . Alcohol Use: No    No Known Allergies  Current Outpatient Prescriptions  Medication Sig Dispense Refill  . drospirenone-ethinyl estradiol (YASMIN,ZARAH,SYEDA) 3-0.03 MG tablet Take 1 tablet by mouth daily.      . SUMAtriptan (IMITREX) 100 MG tablet One tab at start of migraine, may repeat once 2 hours later as needed. Max 2 tabs/day  9 tablet  1   No current facility-administered medications for this visit.    Review of  Systems Review of Systems  Constitutional: Negative for fever, chills and unexpected weight change.  HENT: Negative for congestion, hearing loss, sore throat, trouble swallowing and voice change.   Eyes: Negative for visual disturbance.  Respiratory: Negative for cough and wheezing.   Cardiovascular: Negative for chest pain, palpitations and leg swelling.  Gastrointestinal: Positive for nausea, vomiting and abdominal distention. Negative for abdominal pain, diarrhea, constipation, blood in stool and anal bleeding.  Genitourinary: Negative for hematuria, vaginal bleeding and difficulty urinating.  Musculoskeletal: Negative for arthralgias.  Skin: Negative for rash and wound.  Neurological: Negative for seizures, syncope and headaches.  Hematological: Negative for adenopathy. Does not bruise/bleed easily.  Psychiatric/Behavioral: Negative for confusion.    Blood pressure 120/72, pulse 96, temperature 98.5 F (36.9 C), temperature source Oral, resp. rate 14, height 5\' 9"  (1.753 m), weight 206 lb 3.2 oz (93.532 kg).  Physical Exam Physical Exam  Constitutional: She is oriented to person, place, and time. She appears well-developed and well-nourished. No distress.  HENT:  Head: Normocephalic and atraumatic.  Right Ear: External ear normal.  Left Ear: External ear normal.  Nose: Nose normal.  Mouth/Throat: Oropharynx is clear and moist. No oropharyngeal exudate.  Eyes: Conjunctivae are normal. Pupils are equal, round, and reactive to light. Right eye exhibits no  discharge. Left eye exhibits no discharge. No scleral icterus.  Neck: Normal range of motion. Neck supple. No tracheal deviation present.  Cardiovascular: Normal rate, regular rhythm, normal heart sounds and intact distal pulses.   No murmur heard. Pulmonary/Chest: Effort normal and breath sounds normal. No respiratory distress. She has no wheezes. She has no rales.  Abdominal: Soft. Bowel sounds are normal. She exhibits no  distension. There is no tenderness. There is no guarding.  Musculoskeletal: Normal range of motion. She exhibits no edema and no tenderness.  Lymphadenopathy:    She has no cervical adenopathy.  Neurological: She is alert and oriented to person, place, and time.  Skin: Skin is warm and dry. No rash noted. She is not diaphoretic. No erythema.  Psychiatric: Her behavior is normal. Judgment normal.    Data Reviewed She has an ultrasound showing multiple gallstones with several in the gallbladder neck. There is no gallbladder wall thickening at about its normal. Her liver function tests are normal  Assessment    Symptomatic cholelithiasis     Plan    Laparoscopic cholecystectomy with possible cholangiogram is recommended. I discussed this with her in detail. I gave her literature regarding the surgery. I discussed the risks which includes but is not limited to bleeding, infection, bile duct injury, bile leak, injury to other structures, the need to convert to number C., et Ronney Asters. I also discussed postoperative recovery. She understands and wishes to proceed. Surgery will be scheduled        Tamyka Bezio A 10/11/2013, 2:49 PM

## 2013-10-12 ENCOUNTER — Ambulatory Visit (INDEPENDENT_AMBULATORY_CARE_PROVIDER_SITE_OTHER): Payer: Commercial Managed Care - PPO | Admitting: Surgery

## 2013-11-15 ENCOUNTER — Other Ambulatory Visit (HOSPITAL_COMMUNITY): Payer: 59

## 2013-11-22 ENCOUNTER — Encounter (HOSPITAL_COMMUNITY): Payer: Self-pay | Admitting: Pharmacy Technician

## 2013-11-22 ENCOUNTER — Other Ambulatory Visit (HOSPITAL_COMMUNITY): Payer: Self-pay | Admitting: Surgery

## 2013-11-22 NOTE — Patient Instructions (Addendum)
[place]  11/22/2013   Your procedure is scheduled on: Wednesday April 1st, 2015  Report to [facility] at 630 AM.  Call this number if you have problems the morning of surgery [phone]   Remember:  Do not eat food or drink liquids :After Midnight.     Take these medicines the morning of surgery with A SIP OF WATER:  bc pill                                [place]             You may not have any metal on your body including hair pins and piercings  Do not wear jewelry, make-up.  Do not wear lotions, powders, or perfumes. No  Deodorant is to be worn.   Men may shave face and neck.  Do not bring valuables to the hospital. [place].  Contacts, dentures or bridgework may not be worn into surgery.  Leave suitcase in the car. After surgery it may be brought to your room.  For patients admitted to the hospital, checkout time is 11:00 AM the day of discharge.   Patients discharged the day of surgery will not be allowed to drive home.  Name and phone number of your driver:spouse cody cell [phone]  Special Instructions: N/A  Please read over the following fact sheets that you were given: [facility] Preparing for surgery sheet  Call Zelphia Cairo RN pre op nurse if needed [phone]    [place].  PATIENT SIGNATURE___________________________________________  NURSE SIGNATURE_____________________________________________

## 2013-11-25 ENCOUNTER — Other Ambulatory Visit (HOSPITAL_COMMUNITY): Payer: Self-pay | Admitting: Surgery

## 2013-11-25 ENCOUNTER — Encounter (HOSPITAL_COMMUNITY): Payer: Self-pay

## 2013-11-25 ENCOUNTER — Ambulatory Visit: Admit: 2013-11-25 | Payer: 59 | Admitting: Surgery

## 2013-11-25 ENCOUNTER — Encounter (HOSPITAL_COMMUNITY)
Admission: RE | Admit: 2013-11-25 | Discharge: 2013-11-25 | Disposition: A | Discharge status: Home / Self Care | Payer: 59 | Source: Ambulatory Visit | Attending: Surgery | Admitting: Surgery

## 2013-11-25 DIAGNOSIS — Z01812 Encounter for preprocedural laboratory examination: Secondary | ICD-10-CM | POA: Insufficient documentation

## 2013-11-25 HISTORY — DX: Headache: R51

## 2013-11-25 LAB — CBC
HCT: 37.8 % (ref 36.0–46.0)
HEMOGLOBIN: 13.4 g/dL (ref 12.0–15.0)
MCH: 28.7 pg (ref 26.0–34.0)
MCHC: 35.4 g/dL (ref 30.0–36.0)
MCV: 80.9 fL (ref 78.0–100.0)
PLATELETS: 261 10*3/uL (ref 150–400)
RBC: 4.67 MIL/uL (ref 3.87–5.11)
RDW: 12.2 % (ref 11.5–15.5)
WBC: 4.8 10*3/uL (ref 4.0–10.5)

## 2013-11-25 LAB — COMPREHENSIVE METABOLIC PANEL
ALK PHOS: 79 U/L (ref 39–117)
ALT: 16 U/L (ref 0–35)
AST: 21 U/L (ref 0–37)
Albumin: 4.1 g/dL (ref 3.5–5.2)
BILIRUBIN TOTAL: 0.3 mg/dL (ref 0.3–1.2)
BUN: 8 mg/dL (ref 6–23)
CALCIUM: 10.1 mg/dL (ref 8.4–10.5)
CHLORIDE: 103 meq/L (ref 96–112)
CO2: 26 mEq/L (ref 19–32)
Creatinine, Ser: 0.75 mg/dL (ref 0.50–1.10)
GFR calc Af Amer: >90 mL/min (ref >90)
GLUCOSE: 91 mg/dL (ref 70–99)
Potassium: 4.2 mEq/L (ref 3.7–5.3)
SODIUM: 140 meq/L (ref 137–147)
Total Protein: 8.2 g/dL (ref 6.0–8.3)

## 2013-11-25 LAB — HCG, SERUM, QUALITATIVE: PREG SERUM: NEGATIVE

## 2013-11-25 SURGERY — LAPAROSCOPIC CHOLECYSTECTOMY
Anesthesia: General

## 2013-11-30 NOTE — H&P (Signed)
Chief Complaint   Patient presents with   .  New Evaluation     eval cholelithiasis   HPI  Shannon Hale is a 33 y.o. female.  HPI  This is a very pleasant female referred to me by Debbrah Alar NP for evaluation of symptomatic cholelithiasis. She's been having attacks of right quadrant abdominal pain found fullness after fatty meals for possible one month. She had one episode of emesis. She had no stone confirming gallstones. Today she is currently pain-free and is without complaints.  Past Medical History   Diagnosis  Date   .  Abnormal Pap smear    .  History of chicken pox    .  Nausea    .  Hepatitis     Past Surgical History   Procedure  Laterality  Date   .  Tonsillectomy     .  Septoplasty     .  Wisdom tooth extraction     .  No past surgeries      Family History   Problem  Relation  Age of Onset   .  Cancer  Mother      breast, leaukemia   .  Heart disease  Father    .  Seizures  Brother    .  Diabetes  Paternal Uncle    .  Hypertension  Maternal Grandmother    .  Diabetes  Maternal Grandmother    .  Depression  Maternal Grandmother      anxiety also   .  Cancer  Maternal Grandmother      breast   .  Diabetes  Paternal Grandmother    .  Heart disease  Paternal Grandfather    .  Cancer  Paternal Grandfather      colon   .  Cancer  Maternal Aunt      cervical   Social History  History   Substance Use Topics   .  Smoking status:  Former Smoker     Quit date:  10/11/2002   .  Smokeless tobacco:  Never Used   .  Alcohol Use:  No   No Known Allergies  Current Outpatient Prescriptions   Medication  Sig  Dispense  Refill   .  drospirenone-ethinyl estradiol (YASMIN,ZARAH,SYEDA) 3-0.03 MG tablet  Take 1 tablet by mouth daily.     .  SUMAtriptan (IMITREX) 100 MG tablet  One tab at start of migraine, may repeat once 2 hours later as needed. Max 2 tabs/day  9 tablet  1    No current facility-administered medications for this visit.   Review of Systems   Review of Systems  Constitutional: Negative for fever, chills and unexpected weight change.  HENT: Negative for congestion, hearing loss, sore throat, trouble swallowing and voice change.  Eyes: Negative for visual disturbance.  Respiratory: Negative for cough and wheezing.  Cardiovascular: Negative for chest pain, palpitations and leg swelling.  Gastrointestinal: Positive for nausea, vomiting and abdominal distention. Negative for abdominal pain, diarrhea, constipation, blood in stool and anal bleeding.  Genitourinary: Negative for hematuria, vaginal bleeding and difficulty urinating.  Musculoskeletal: Negative for arthralgias.  Skin: Negative for rash and wound.  Neurological: Negative for seizures, syncope and headaches.  Hematological: Negative for adenopathy. Does not bruise/bleed easily.  Psychiatric/Behavioral: Negative for confusion.  Blood pressure 120/72, pulse 96, temperature 98.5 F (36.9 C), temperature source Oral, resp. rate 14, height 5\' 9"  (1.753 m), weight 206 lb 3.2 oz (93.532 kg).  Physical Exam  Physical Exam  Constitutional: She is oriented to person, place, and time. She appears well-developed and well-nourished. No distress.  HENT:  Head: Normocephalic and atraumatic.  Right Ear: External ear normal.  Left Ear: External ear normal.  Nose: Nose normal.  Mouth/Throat: Oropharynx is clear and moist. No oropharyngeal exudate.  Eyes: Conjunctivae are normal. Pupils are equal, round, and reactive to light. Right eye exhibits no discharge. Left eye exhibits no discharge. No scleral icterus.  Neck: Normal range of motion. Neck supple. No tracheal deviation present.  Cardiovascular: Normal rate, regular rhythm, normal heart sounds and intact distal pulses.  No murmur heard.  Pulmonary/Chest: Effort normal and breath sounds normal. No respiratory distress. She has no wheezes. She has no rales.  Abdominal: Soft. Bowel sounds are normal. She exhibits no distension. There is  no tenderness. There is no guarding.  Musculoskeletal: Normal range of motion. She exhibits no edema and no tenderness.  Lymphadenopathy:  She has no cervical adenopathy.  Neurological: She is alert and oriented to person, place, and time.  Skin: Skin is warm and dry. No rash noted. She is not diaphoretic. No erythema.  Psychiatric: Her behavior is normal. Judgment normal.  Data Reviewed  She has an ultrasound showing multiple gallstones with several in the gallbladder neck. There is no gallbladder wall thickening at about its normal. Her liver function tests are normal  Assessment  Symptomatic cholelithiasis  Plan  Laparoscopic cholecystectomy with possible cholangiogram is recommended. I discussed this with her in detail. I gave her literature regarding the surgery. I discussed the risks which includes but is not limited to bleeding, infection, bile duct injury, bile leak, injury to other structures, the need to convert to number C., et Ronney Asters. I also discussed postoperative recovery. She understands and wishes to proceed. Surgery will be scheduled

## 2013-12-01 ENCOUNTER — Encounter (HOSPITAL_COMMUNITY): Payer: Self-pay | Admitting: *Deleted

## 2013-12-01 ENCOUNTER — Ambulatory Visit (HOSPITAL_COMMUNITY): Payer: 59 | Admitting: Certified Registered Nurse Anesthetist

## 2013-12-01 ENCOUNTER — Encounter (HOSPITAL_COMMUNITY): Payer: 59 | Admitting: Certified Registered Nurse Anesthetist

## 2013-12-01 ENCOUNTER — Encounter (HOSPITAL_COMMUNITY)
Admission: RE | Disposition: A | Discharge status: Home / Self Care | Payer: Self-pay | Source: Ambulatory Visit | Attending: Surgery

## 2013-12-01 ENCOUNTER — Ambulatory Visit (HOSPITAL_COMMUNITY)
Admission: RE | Admit: 2013-12-01 | Discharge: 2013-12-01 | Disposition: A | Discharge status: Home / Self Care | Payer: 59 | Source: Ambulatory Visit | Attending: Surgery | Admitting: Surgery

## 2013-12-01 DIAGNOSIS — Z87891 Personal history of nicotine dependence: Secondary | ICD-10-CM | POA: Insufficient documentation

## 2013-12-01 DIAGNOSIS — K8010 Calculus of gallbladder with chronic cholecystitis without obstruction: Secondary | ICD-10-CM

## 2013-12-01 HISTORY — PX: CHOLECYSTECTOMY: SHX55

## 2013-12-01 SURGERY — LAPAROSCOPIC CHOLECYSTECTOMY
Anesthesia: General | Site: Abdomen

## 2013-12-01 MED ORDER — LIDOCAINE HCL 1 % IJ SOLN
INTRAMUSCULAR | Status: DC | PRN
  Administered 2013-12-01: 60 mg via INTRADERMAL

## 2013-12-01 MED ORDER — PROMETHAZINE HCL 25 MG/ML IJ SOLN
INTRAMUSCULAR | Status: AC
  Filled 2013-12-01: qty 1

## 2013-12-01 MED ORDER — SODIUM CHLORIDE 0.9 % IJ SOLN: 3.0000 mL | INTRAMUSCULAR | Status: DC | PRN

## 2013-12-01 MED ORDER — PROMETHAZINE HCL 25 MG/ML IJ SOLN
6.2500 mg | INTRAMUSCULAR | Status: DC | PRN
  Administered 2013-12-01: 6.25 mg via INTRAVENOUS

## 2013-12-01 MED ORDER — PROPOFOL 10 MG/ML IV BOLUS
INTRAVENOUS | Status: DC | PRN
  Administered 2013-12-01: 200 mg via INTRAVENOUS

## 2013-12-01 MED ORDER — CEFAZOLIN SODIUM-DEXTROSE 2-3 GM-% IV SOLR
2.0000 g | INTRAVENOUS | Status: AC
  Administered 2013-12-01: 2 g via INTRAVENOUS

## 2013-12-01 MED ORDER — BUPIVACAINE-EPINEPHRINE PF 0.5-1:200000 % IJ SOLN
INTRAMUSCULAR | Status: DC | PRN
  Administered 2013-12-01: 20 mL

## 2013-12-01 MED ORDER — CEFAZOLIN SODIUM-DEXTROSE 2-3 GM-% IV SOLR
INTRAVENOUS | Status: AC
  Filled 2013-12-01: qty 50

## 2013-12-01 MED ORDER — HYDROMORPHONE HCL PF 1 MG/ML IJ SOLN
INTRAMUSCULAR | Status: AC
  Filled 2013-12-01: qty 1

## 2013-12-01 MED ORDER — SODIUM CHLORIDE 0.9 % IJ SOLN: 3.0000 mL | Freq: Two times a day (BID) | INTRAMUSCULAR | Status: DC

## 2013-12-01 MED ORDER — HYDROCODONE-ACETAMINOPHEN 5-325 MG PO TABS: 1.0000 | ORAL_TABLET | ORAL | Status: DC | PRN

## 2013-12-01 MED ORDER — KETOROLAC TROMETHAMINE 30 MG/ML IJ SOLN: 15.0000 mg | Freq: Once | INTRAMUSCULAR | Status: DC | PRN

## 2013-12-01 MED ORDER — OXYCODONE HCL 5 MG PO TABS
5.0000 mg | ORAL_TABLET | ORAL | Status: DC | PRN
  Administered 2013-12-01: 5 mg via ORAL
  Filled 2013-12-01: qty 1

## 2013-12-01 MED ORDER — LIDOCAINE HCL (CARDIAC) 20 MG/ML IV SOLN
INTRAVENOUS | Status: AC
  Filled 2013-12-01: qty 5

## 2013-12-01 MED ORDER — DEXAMETHASONE SODIUM PHOSPHATE 10 MG/ML IJ SOLN
INTRAMUSCULAR | Status: AC
  Filled 2013-12-01: qty 1

## 2013-12-01 MED ORDER — ONDANSETRON HCL 4 MG PO TABS
4.0000 mg | ORAL_TABLET | Freq: Once | ORAL | Status: DC
  Filled 2013-12-01: qty 1

## 2013-12-01 MED ORDER — MIDAZOLAM HCL 5 MG/5ML IJ SOLN
INTRAMUSCULAR | Status: DC | PRN
  Administered 2013-12-01: 2 mg via INTRAVENOUS

## 2013-12-01 MED ORDER — ACETAMINOPHEN 10 MG/ML IV SOLN
1000.0000 mg | Freq: Once | INTRAVENOUS | Status: DC
  Filled 2013-12-01: qty 100

## 2013-12-01 MED ORDER — ONDANSETRON HCL 4 MG/2ML IJ SOLN
INTRAMUSCULAR | Status: DC | PRN
  Administered 2013-12-01: 4 mg via INTRAVENOUS

## 2013-12-01 MED ORDER — NEOSTIGMINE METHYLSULFATE 1 MG/ML IJ SOLN
INTRAMUSCULAR | Status: AC
  Filled 2013-12-01: qty 10

## 2013-12-01 MED ORDER — GLYCOPYRROLATE 0.2 MG/ML IJ SOLN
INTRAMUSCULAR | Status: AC
  Filled 2013-12-01: qty 4

## 2013-12-01 MED ORDER — GLYCOPYRROLATE 0.2 MG/ML IJ SOLN
INTRAMUSCULAR | Status: DC | PRN
  Administered 2013-12-01: .8 mg via INTRAVENOUS

## 2013-12-01 MED ORDER — ONDANSETRON HCL 4 MG/2ML IJ SOLN
4.0000 mg | Freq: Once | INTRAMUSCULAR | Status: AC
  Administered 2013-12-01: 4 mg via INTRAVENOUS
  Filled 2013-12-01: qty 2

## 2013-12-01 MED ORDER — LACTATED RINGERS IR SOLN
Status: DC | PRN
  Administered 2013-12-01: 1000 mL

## 2013-12-01 MED ORDER — PHENYLEPHRINE HCL 10 MG/ML IJ SOLN
INTRAMUSCULAR | Status: DC | PRN
  Administered 2013-12-01: 80 ug via INTRAVENOUS
  Administered 2013-12-01: 40 ug via INTRAVENOUS

## 2013-12-01 MED ORDER — SUCCINYLCHOLINE CHLORIDE 20 MG/ML IJ SOLN
INTRAMUSCULAR | Status: DC | PRN
  Administered 2013-12-01: 100 mg via INTRAVENOUS

## 2013-12-01 MED ORDER — KETOROLAC TROMETHAMINE 30 MG/ML IJ SOLN
INTRAMUSCULAR | Status: AC
  Filled 2013-12-01: qty 1

## 2013-12-01 MED ORDER — HEMOSTATIC AGENTS (NO CHARGE) OPTIME
TOPICAL | Status: DC | PRN
  Administered 2013-12-01 (×2): 1 via TOPICAL

## 2013-12-01 MED ORDER — ACETAMINOPHEN 10 MG/ML IV SOLN
INTRAVENOUS | Status: DC | PRN
  Administered 2013-12-01: 1000 mg via INTRAVENOUS

## 2013-12-01 MED ORDER — ROCURONIUM BROMIDE 100 MG/10ML IV SOLN
INTRAVENOUS | Status: AC
  Filled 2013-12-01: qty 1

## 2013-12-01 MED ORDER — FENTANYL CITRATE 0.05 MG/ML IJ SOLN
INTRAMUSCULAR | Status: AC
  Filled 2013-12-01: qty 5

## 2013-12-01 MED ORDER — SODIUM CHLORIDE 0.9 % IV SOLN
4.0000 mg | Freq: Once | INTRAVENOUS | Status: DC
  Filled 2013-12-01: qty 2

## 2013-12-01 MED ORDER — HYDROMORPHONE HCL PF 1 MG/ML IJ SOLN
0.2500 mg | INTRAMUSCULAR | Status: DC | PRN
  Administered 2013-12-01 (×3): 0.5 mg via INTRAVENOUS

## 2013-12-01 MED ORDER — DEXAMETHASONE SODIUM PHOSPHATE 10 MG/ML IJ SOLN
INTRAMUSCULAR | Status: DC | PRN
  Administered 2013-12-01: 10 mg via INTRAVENOUS

## 2013-12-01 MED ORDER — NEOSTIGMINE METHYLSULFATE 1 MG/ML IJ SOLN
INTRAMUSCULAR | Status: DC | PRN
  Administered 2013-12-01: 5 mg via INTRAVENOUS

## 2013-12-01 MED ORDER — PROPOFOL 10 MG/ML IV BOLUS
INTRAVENOUS | Status: AC
  Filled 2013-12-01: qty 20

## 2013-12-01 MED ORDER — METOCLOPRAMIDE HCL 5 MG/ML IJ SOLN
INTRAMUSCULAR | Status: AC
  Filled 2013-12-01: qty 2

## 2013-12-01 MED ORDER — FENTANYL CITRATE 0.05 MG/ML IJ SOLN
INTRAMUSCULAR | Status: DC | PRN
  Administered 2013-12-01: 50 ug via INTRAVENOUS
  Administered 2013-12-01: 100 ug via INTRAVENOUS
  Administered 2013-12-01 (×2): 50 ug via INTRAVENOUS

## 2013-12-01 MED ORDER — SODIUM CHLORIDE 0.9 % IV SOLN: 250.0000 mL | INTRAVENOUS | Status: DC | PRN

## 2013-12-01 MED ORDER — MIDAZOLAM HCL 2 MG/2ML IJ SOLN
INTRAMUSCULAR | Status: AC
  Filled 2013-12-01: qty 2

## 2013-12-01 MED ORDER — ROCURONIUM BROMIDE 100 MG/10ML IV SOLN
INTRAVENOUS | Status: DC | PRN
  Administered 2013-12-01: 5 mg via INTRAVENOUS
  Administered 2013-12-01: 25 mg via INTRAVENOUS

## 2013-12-01 MED ORDER — EPHEDRINE SULFATE 50 MG/ML IJ SOLN
INTRAMUSCULAR | Status: DC | PRN
  Administered 2013-12-01: 15 mg via INTRAVENOUS

## 2013-12-01 MED ORDER — LACTATED RINGERS IV SOLN
INTRAVENOUS | Status: DC | PRN
  Administered 2013-12-01 (×2): via INTRAVENOUS

## 2013-12-01 MED ORDER — BUPIVACAINE-EPINEPHRINE 0.5% -1:200000 IJ SOLN
INTRAMUSCULAR | Status: AC
  Filled 2013-12-01: qty 1

## 2013-12-01 MED ORDER — METOCLOPRAMIDE HCL 5 MG/ML IJ SOLN
INTRAMUSCULAR | Status: DC | PRN
  Administered 2013-12-01: 10 mg via INTRAVENOUS

## 2013-12-01 SURGICAL SUPPLY — 33 items
APPLIER CLIP 5 13 M/L LIGAMAX5 (MISCELLANEOUS) ×2
BANDAGE ADH SHEER 1  50/CT (GAUZE/BANDAGES/DRESSINGS) ×8 IMPLANT
BENZOIN TINCTURE PRP APPL 2/3 (GAUZE/BANDAGES/DRESSINGS) ×2 IMPLANT
CANISTER SUCTION 2500CC (MISCELLANEOUS) ×2 IMPLANT
CHLORAPREP W/TINT 26ML (MISCELLANEOUS) ×2 IMPLANT
CLIP APPLIE 5 13 M/L LIGAMAX5 (MISCELLANEOUS) ×1 IMPLANT
COVER MAYO STAND STRL (DRAPES) IMPLANT
DECANTER SPIKE VIAL GLASS SM (MISCELLANEOUS) ×2 IMPLANT
DRAPE C-ARM 42X120 X-RAY (DRAPES) IMPLANT
DRAPE LAPAROSCOPIC ABDOMINAL (DRAPES) ×2 IMPLANT
DRAPE UTILITY XL STRL (DRAPES) IMPLANT
ELECT REM PT RETURN 9FT ADLT (ELECTROSURGICAL) ×2
ELECTRODE REM PT RTRN 9FT ADLT (ELECTROSURGICAL) ×1 IMPLANT
GLOVE BIOGEL PI IND STRL 7.0 (GLOVE) ×4 IMPLANT
GLOVE BIOGEL PI INDICATOR 7.0 (GLOVE) ×4
GLOVE SURG SIGNA 7.5 PF LTX (GLOVE) ×2 IMPLANT
GOWN STRL REUS W/TWL XL LVL3 (GOWN DISPOSABLE) ×8 IMPLANT
HEMOSTAT SNOW SURGICEL 2X4 (HEMOSTASIS) ×4 IMPLANT
HEMOSTAT SURGICEL 4X8 (HEMOSTASIS) IMPLANT
KIT BASIN OR (CUSTOM PROCEDURE TRAY) ×2 IMPLANT
POUCH SPECIMEN RETRIEVAL 10MM (ENDOMECHANICALS) ×2 IMPLANT
SET CHOLANGIOGRAPH MIX (MISCELLANEOUS) IMPLANT
SET IRRIG TUBING LAPAROSCOPIC (IRRIGATION / IRRIGATOR) ×2 IMPLANT
SOLUTION ANTI FOG 6CC (MISCELLANEOUS) ×2 IMPLANT
STRIP CLOSURE SKIN 1/2X4 (GAUZE/BANDAGES/DRESSINGS) ×2 IMPLANT
SUT MNCRL AB 4-0 PS2 18 (SUTURE) ×2 IMPLANT
TOWEL OR 17X26 10 PK STRL BLUE (TOWEL DISPOSABLE) ×2 IMPLANT
TOWEL OR NON WOVEN STRL DISP B (DISPOSABLE) ×2 IMPLANT
TRAY LAP CHOLE (CUSTOM PROCEDURE TRAY) ×2 IMPLANT
TROCAR BLADELESS OPT 5 75 (ENDOMECHANICALS) ×4 IMPLANT
TROCAR SLEEVE XCEL 5X75 (ENDOMECHANICALS) ×4 IMPLANT
TROCAR XCEL BLUNT TIP 100MML (ENDOMECHANICALS) ×2 IMPLANT
TUBING INSUFFLATION 10FT LAP (TUBING) ×2 IMPLANT

## 2013-12-01 NOTE — Interval H&P Note (Signed)
History and Physical Interval Note: no change in H and P  12/01/2013 6:45 AM  Shannon Hale  has presented today for surgery, with the diagnosis of gallstones/symptomic cholelithiasis  The various methods of treatment have been discussed with the patient and family. After consideration of risks, benefits and other options for treatment, the patient has consented to  Procedure(s): LAPAROSCOPIC CHOLECYSTECTOMY POSSIBLE IOC (N/A) as a surgical intervention .  The patient's history has been reviewed, patient examined, no change in status, stable for surgery.  I have reviewed the patient's chart and labs.  Questions were answered to the patient's satisfaction.     [name] A

## 2013-12-01 NOTE — Transfer of Care (Signed)
Immediate Anesthesia Transfer of Care Note  Patient: Shannon Hale  Procedure(s) Performed: Procedure(s): LAPAROSCOPIC CHOLECYSTECTOMY  (N/A)  Patient Location: PACU  Anesthesia Type:General  Level of Consciousness: awake and alert   Airway & Oxygen Therapy: Patient Spontanous Breathing and Patient connected to face mask oxygen  Post-op Assessment: Report given to PACU RN and Post -op Vital signs reviewed and stable  Post vital signs: Reviewed and stable  Complications: No apparent anesthesia complications

## 2013-12-01 NOTE — Anesthesia Preprocedure Evaluation (Signed)
Anesthesia Evaluation  Patient identified by MRN, date of birth, ID band Patient awake    Reviewed: Allergy & Precautions, H&P , NPO status , Patient's Chart, lab work & pertinent test results  Airway Mallampati: II TM Distance: <3 FB Neck ROM: Full    Dental no notable dental hx.    Pulmonary former smoker,  breath sounds clear to auscultation  Pulmonary exam normal       Cardiovascular negative cardio ROS  Rhythm:Regular Rate:Normal     Neuro/Psych negative neurological ROS  negative psych ROS   GI/Hepatic negative GI ROS, Neg liver ROS,   Endo/Other  Morbid obesityobesity  Renal/GU negative Renal ROS  negative genitourinary   Musculoskeletal negative musculoskeletal ROS (+)   Abdominal   Peds negative pediatric ROS (+)  Hematology negative hematology ROS (+)   Anesthesia Other Findings   Reproductive/Obstetrics negative OB ROS                           Anesthesia Physical Anesthesia Plan  ASA: II  Anesthesia Plan: General   Post-op Pain Management:    Induction: Intravenous  Airway Management Planned: Oral ETT  Additional Equipment:   Intra-op Plan:   Post-operative Plan: Extubation in OR  Informed Consent: I have reviewed the patients History and Physical, chart, labs and discussed the procedure including the risks, benefits and alternatives for the proposed anesthesia with the patient or authorized representative who has indicated his/her understanding and acceptance.   Dental advisory given  Plan Discussed with: CRNA and Surgeon  Anesthesia Plan Comments:         Anesthesia Quick Evaluation

## 2013-12-01 NOTE — Anesthesia Postprocedure Evaluation (Signed)
  Anesthesia Post-op Note  Patient: Shannon Hale  Procedure(s) Performed: Procedure(s) (LRB): LAPAROSCOPIC CHOLECYSTECTOMY  (N/A)  Patient Location: PACU  Anesthesia Type: General  Level of Consciousness: awake and alert   Airway and Oxygen Therapy: Patient Spontanous Breathing  Post-op Pain: mild  Post-op Assessment: Post-op Vital signs reviewed, Patient's Cardiovascular Status Stable, Respiratory Function Stable, Patent Airway and No signs of Nausea or vomiting  Last Vitals:  Filed Vitals:   12/01/13 0945  BP: 135/59  Pulse: 82  Temp:   Resp: 17    Post-op Vital Signs: stable   Complications: No apparent anesthesia complications

## 2013-12-01 NOTE — Op Note (Signed)
Laparoscopic Cholecystectomy Procedure Note  Indications: This patient presents with symptomatic gallbladder disease and will undergo laparoscopic cholecystectomy.  Pre-operative Diagnosis: symptomatic cholelithiasis  Post-operative Diagnosis: same  Surgeon: Coralie Keens A   Assistants: 0  Anesthesia: General endotracheal anesthesia  ASA Class: 1  Procedure Details  The patient was seen again in the Holding Room. The risks, benefits, complications, treatment options, and expected outcomes were discussed with the patient. The possibilities of reaction to medication, pulmonary aspiration, perforation of viscus, bleeding, recurrent infection, finding a normal gallbladder, the need for additional procedures, failure to diagnose a condition, the possible need to convert to an open procedure, and creating a complication requiring transfusion or operation were discussed with the patient. The likelihood of improving the patient's symptoms with return to their baseline status is good.  The patient and/or family concurred with the proposed plan, giving informed consent. The site of surgery properly noted. The patient was taken to Operating Room, identified as Shannon Hale and the procedure verified as Laparoscopic Cholecystectomy with Intraoperative Cholangiogram. A Time Out was held and the above information confirmed.  Prior to the induction of general anesthesia, antibiotic prophylaxis was administered. General endotracheal anesthesia was then administered and tolerated well. After the induction, the abdomen was prepped with Chloraprep and draped in sterile fashion. The patient was positioned in the supine position.  Local anesthetic agent was injected into the skin near the umbilicus and an incision made. We dissected down to the abdominal fascia with blunt dissection.  The fascia was incised vertically and we entered the peritoneal cavity bluntly.  A pursestring suture of 0-Vicryl was placed  around the fascial opening.  The Hasson cannula was inserted and secured with the stay suture.  Pneumoperitoneum was then created with CO2 and tolerated well without any adverse changes in the patient's vital signs. An 11-mm port was placed in the subxiphoid position.  Two 5-mm ports were placed in the right upper quadrant. All skin incisions were infiltrated with a local anesthetic agent before making the incision and placing the trocars.   We positioned the patient in reverse Trendelenburg, tilted slightly to the patient's left.  The gallbladder was identified, the fundus grasped and retracted cephalad. There were multiple adhesions to the gallbladder.  It was thick walled and contained many stones. Adhesions were lysed bluntly and with the electrocautery where indicated, taking care not to injure any adjacent organs or viscus. The infundibulum was grasped and retracted laterally, exposing the peritoneum overlying the triangle of Calot. This was then divided and exposed in a blunt fashion. The cystic duct was clearly identified and bluntly dissected circumferentially. A critical view of the cystic duct and cystic artery was obtained.  The cystic duct was then ligated with clips and divided. The cystic artery was, dissected free, ligated with clips and divided as well.   The gallbladder was dissected from the liver bed in retrograde fashion with the electrocautery. The gallbladder was removed and placed in an Endocatch sac. The liver bed was irrigated and inspected. Hemostasis was achieved with the electrocautery as well as surgical snow for hemostasis from a moderate size vein half way up the gallbladder fossa. Copious irrigation was utilized and was repeatedly aspirated until clear.  Again, hemostasis appeared to be achieved.  The gallbladder and Endocatch sac were then removed through the umbilical port site.  The pursestring suture was used to close the umbilical fascia.    We again inspected the right  upper quadrant for hemostasis.  Pneumoperitoneum was released  as we removed the trocars.  4-0 Monocryl was used to close the skin.   Benzoin, steri-strips, and clean dressings were applied. The patient was then extubated and brought to the recovery room in stable condition. Instrument, sponge, and needle counts were correct at closure and at the conclusion of the case.   Findings: Chronic Cholecystitis with Cholelithiasis  Estimated Blood Loss: less than 50 mL         Drains: 0         Specimens: Gallbladder           Complications: None; patient tolerated the procedure well.         Disposition: PACU - hemodynamically stable.         Condition: stable

## 2013-12-01 NOTE — Discharge Instructions (Signed)
[facility] ______[facility] LAPAROSCOPIC SURGERY: POST OP INSTRUCTIONS Always review your discharge instruction sheet given to you by the facility where your surgery was performed. IF YOU HAVE DISABILITY OR FAMILY LEAVE FORMS, YOU MUST BRING THEM TO THE OFFICE FOR PROCESSING.   DO NOT GIVE THEM TO YOUR DOCTOR.  1. A prescription for pain medication may be given to you upon discharge.  Take your pain medication as prescribed, if needed.  If narcotic pain medicine is not needed, then you may take acetaminophen (Tylenol) or ibuprofen (Advil) as needed. 2. Take your usually prescribed medications unless otherwise directed. 3. If you need a refill on your pain medication, please contact your pharmacy.  They will contact our office to request authorization. Prescriptions will not be filled after 5pm or on week-ends. 4. You should follow a light diet the first few days after arrival home, such as soup and crackers, etc.  Be sure to include lots of fluids daily. 5. Most patients will experience some swelling and bruising in the area of the incisions.  Ice packs will help.  Swelling and bruising can take several days to resolve.  6. It is common to experience some constipation if taking pain medication after surgery.  Increasing fluid intake and taking a stool softener (such as Colace) will usually help or prevent this problem from occurring.  A mild laxative (Milk of Magnesia or Miralax) should be taken according to package instructions if there are no bowel movements after 48 hours. 7. Unless discharge instructions indicate otherwise, you may remove your bandages 24-48 hours after surgery, and you may shower at that time.  You may have steri-strips (small skin tapes) in place directly over the incision.  These strips should be left on the skin for 7-10 days.  If your surgeon used skin glue on the incision, you may shower in 24 hours.  The glue will flake off over the next 2-3 weeks.  Any sutures or  staples will be removed at the office during your follow-up visit. 8. ACTIVITIES:  You may resume regular (light) daily activities beginning the next day--such as daily self-care, walking, climbing stairs--gradually increasing activities as tolerated.  You may have sexual intercourse when it is comfortable.  Refrain from any heavy lifting or straining until approved by your doctor. a. You may drive when you are no longer taking prescription pain medication, you can comfortably wear a seatbelt, and you can safely maneuver your car and apply brakes. b. RETURN TO WORK:  ___APRIL 20, 2015_______________________________________________________ 9. You should see your doctor in the office for a follow-up appointment approximately 2-3 weeks after your surgery.  Make sure that you call for this appointment within a day or two after you arrive home to insure a convenient appointment time. 10. OTHER INSTRUCTIONS: __ICE PACK AND IBUPROFEN ALSO FOR PAIN. 11. STOOL SOFTENER FOR CONSTIPATION________________________________________________________________________________________________________________________ __________________________________________________________________________________________________________________________ WHEN TO CALL YOUR DOCTOR: 1. Fever over 101.0 2. Inability to urinate 3. Continued bleeding from incision. 4. Increased pain, redness, or drainage from the incision. 5. Increasing abdominal pain  The clinic staff is available to answer your questions during regular business hours.  Please dont hesitate to call and ask to speak to one of the nurses for clinical concerns.  If you have a medical emergency, go to the nearest emergency room or call 911.  A surgeon from [facility] is always on call at the hospital. [address], [place], [place], [place]  [address] ? [address] [address], [place], [place]  [address] [phone] ? [phone] ? FAX [phone] Web site:  [email or web address]*-*-*-*-*-*-*-*-*-*-*---------------------------------------------------------------------------------------*************************---*

## 2013-12-01 NOTE — Addendum Note (Signed)
Addendum created 12/01/13 1028 by Kimmerly Lora C [place] CRNA   Modules edited: Anesthesia LDA

## 2013-12-02 ENCOUNTER — Encounter (HOSPITAL_COMMUNITY): Payer: Self-pay | Admitting: Surgery

## 2013-12-28 ENCOUNTER — Encounter (INDEPENDENT_AMBULATORY_CARE_PROVIDER_SITE_OTHER): Payer: Self-pay | Admitting: Surgery

## 2013-12-28 ENCOUNTER — Ambulatory Visit (INDEPENDENT_AMBULATORY_CARE_PROVIDER_SITE_OTHER): Payer: Commercial Managed Care - PPO | Admitting: Surgery

## 2013-12-28 VITALS — BP 130/84 | HR 77 | Temp 98.3°F | Ht 69.0 in | Wt 202.6 lb

## 2013-12-28 DIAGNOSIS — Z09 Encounter for follow-up examination after completed treatment for conditions other than malignant neoplasm: Secondary | ICD-10-CM

## 2013-12-28 NOTE — Progress Notes (Signed)
Subjective:     Patient ID: Shannon Hale, female   DOB: 05-22-1981, 33 y.o.   MRN: [id number]  HPI  She is here for her first postop visit status post laparoscopic cholecystectomy. She is doing well and has no complaints. She is eating well and moving her bowels well   Review of Systems     Objective:   Physical Exam On exam, her incisions are well healed. Her final pathology showed chronic cholecystitis with gallstones    Assessment:     Patient stable postop     Plan:     She may resume normal activity. I will see her back as needed

## 2013-12-29 ENCOUNTER — Encounter: Payer: Self-pay | Admitting: Physician Assistant

## 2013-12-29 ENCOUNTER — Ambulatory Visit (INDEPENDENT_AMBULATORY_CARE_PROVIDER_SITE_OTHER): Payer: 59 | Admitting: Physician Assistant

## 2013-12-29 VITALS — BP 120/78 | HR 93 | Temp 98.3°F | Resp 16 | Ht 69.0 in | Wt 204.0 lb

## 2013-12-29 DIAGNOSIS — L309 Dermatitis, unspecified: Secondary | ICD-10-CM

## 2013-12-29 DIAGNOSIS — L259 Unspecified contact dermatitis, unspecified cause: Secondary | ICD-10-CM

## 2013-12-29 MED ORDER — CLOBETASOL PROPIONATE 0.05 % EX CREA: 1.0000 "application " | TOPICAL_CREAM | Freq: Two times a day (BID) | CUTANEOUS | Status: DC

## 2013-12-29 NOTE — Patient Instructions (Signed)
Please use steroid cream as directed for 1-2 weeks.  Can use Sarna lotion and/or a Claritin daily for itch.  Cool compresses may be beneficial.  Do not apply steroid to arm pits or groin.  Call or return to clinic if symptoms are not improving.

## 2013-12-31 DIAGNOSIS — L309 Dermatitis, unspecified: Secondary | ICD-10-CM | POA: Insufficient documentation

## 2013-12-31 NOTE — Progress Notes (Signed)
Patient presents to clinic today c/o pruritic rash of neck and back x 1 week.  Denies pain or drainage from rash.  Denies change in lotions, hygiene products or laundry detergents.  No new pets in the home.  Has not taken anything for symptoms.  Past Medical History  Diagnosis Date  . Abnormal Pap smear   . History of chicken pox   . Nausea   . Headache(784.0)     migraines  . Hepatitis july 2005    unknown form of hepatitis, no current liver problems    Current Outpatient Prescriptions on File Prior to Visit  Medication Sig Dispense Refill  . drospirenone-ethinyl estradiol (YASMIN,ZARAH,SYEDA) 3-0.03 MG tablet Take 1 tablet by mouth daily.      . SUMAtriptan (IMITREX) 100 MG tablet One tab at start of migraine, may repeat once 2 hours later as needed. Max 2 tabs/day  9 tablet  1   No current facility-administered medications on file prior to visit.    No Known Allergies  Family History  Problem Relation Age of Onset  . Cancer Mother     breast, leaukemia  . Heart disease Father   . Seizures Brother   . Diabetes Paternal Uncle   . Hypertension Maternal Grandmother   . Diabetes Maternal Grandmother   . Depression Maternal Grandmother     anxiety also  . Cancer Maternal Grandmother     breast  . Diabetes Paternal Grandmother   . Heart disease Paternal Grandfather   . Cancer Paternal Grandfather     colon  . Cancer Maternal Aunt     cervical    History   Social History  . Marital Status: Married    Spouse Name: N/A    Number of Children: N/A  . Years of Education: N/A   Social History Main Topics  . Smoking status: Former Smoker -- 0.25 packs/day for .5 years    Types: Cigarettes    Quit date: 10/11/2002  . Smokeless tobacco: Never Used  . Alcohol Use: No  . Drug Use: No  . Sexual Activity: Yes   Other Topics Concern  . None   Social History Narrative  . None   Review of Systems - See HPI.  All other ROS are negative.  BP 120/78  Pulse 93  Temp(Src)  98.3 F (36.8 C) (Oral)  Resp 16  Ht _0  (1.753 m)  Wt 204 lb (92.534 kg)  BMI 30.11 kg/m2  SpO2 98%  LMP 12/19/2013  Physical Exam  Vitals reviewed. Constitutional: She is oriented to person, place, and time and well-developed, well-nourished, and in no distress.  HENT:  Head: Normocephalic and atraumatic.  Cardiovascular: Normal rate, regular rhythm, normal heart sounds and intact distal pulses.   Pulmonary/Chest: Effort normal and breath sounds normal. No respiratory distress. She has no wheezes. She has no rales. She exhibits no tenderness.  Neurological: She is alert and oriented to person, place, and time.  Skin: Skin is warm and dry.  Presence of a scattered macular rash over back and lower neck.  Also some scattered macules behind ears bilaterally.  Rash crosses the midline.  Evidence of mild excoriation without superimposed infection.  No rash noted elsewhere.  Psychiatric: Affect normal.    Recent Results (from the past 2160 hour(s))  HEPATIC FUNCTION PANEL     Status: None   Collection Time    10/04/13  8:52 AM      Result Value Ref Range   Total Bilirubin 0.3  0.2 - 1.2 mg/dL   Comment: ** Please note change in reference range(s). **   Bilirubin, Direct 0.1  0.0 - 0.3 mg/dL   Indirect Bilirubin 0.2  0.2 - 1.2 mg/dL   Comment: ** Please note change in reference range(s). **   Alkaline Phosphatase 69  39 - 117 U/L   AST 28  0 - 37 U/L   ALT 29  0 - 35 U/L   Total Protein 7.9  6.0 - 8.3 g/dL   Albumin 4.5  3.5 - 5.2 g/dL  COMPREHENSIVE METABOLIC PANEL     Status: None   Collection Time    11/25/13  3:00 PM      Result Value Ref Range   Sodium 140  137 - 147 mEq/L   Potassium 4.2  3.7 - 5.3 mEq/L   Chloride 103  96 - 112 mEq/L   CO2 26  19 - 32 mEq/L   Glucose, Bld 91  70 - 99 mg/dL   BUN 8  6 - 23 mg/dL   Creatinine, Ser 0.75  0.50 - 1.10 mg/dL   Calcium 10.1  8.4 - 10.5 mg/dL   Total Protein 8.2  6.0 - 8.3 g/dL   Albumin 4.1  3.5 - 5.2 g/dL   AST 21  0 -  37 U/L   ALT 16  0 - 35 U/L   Alkaline Phosphatase 79  39 - 117 U/L   Total Bilirubin 0.3  0.3 - 1.2 mg/dL   GFR calc non Af Amer >90  >90 mL/min   GFR calc Af Amer >90  >90 mL/min   Comment: (NOTE)     The eGFR has been calculated using the CKD EPI equation.     This calculation has not been validated in all clinical situations.     eGFR's persistently <90 mL/min signify possible Chronic Kidney     Disease.  CBC     Status: None   Collection Time    11/25/13  3:00 PM      Result Value Ref Range   WBC 4.8  4.0 - 10.5 K/uL   RBC 4.67  3.87 - 5.11 MIL/uL   Hemoglobin 13.4  12.0 - 15.0 g/dL   HCT 37.8  36.0 - 46.0 %   MCV 80.9  78.0 - 100.0 fL   MCH 28.7  26.0 - 34.0 pg   MCHC 35.4  30.0 - 36.0 g/dL   RDW 12.2  11.5 - 15.5 %   Platelets 261  150 - 400 K/uL  HCG, SERUM, QUALITATIVE     Status: None   Collection Time    11/25/13  3:00 PM      Result Value Ref Range   Preg, Serum NEGATIVE  NEGATIVE   Comment:            THE SENSITIVITY OF THIS     METHODOLOGY IS >10 mIU/mL.    Assessment/Plan: Dermatitis Rx Clobetasol. Sarna lotion.  Daily claritin.  Cool compresses.  Be mindful of lotions and hygiene products. Call or return to clinic if symptoms not improving.

## 2013-12-31 NOTE — Assessment & Plan Note (Signed)
Rx Clobetasol. Sarna lotion.  Daily claritin.  Cool compresses.  Be mindful of lotions and hygiene products. Call or return to clinic if symptoms not improving.

## 2014-01-06 ENCOUNTER — Ambulatory Visit (INDEPENDENT_AMBULATORY_CARE_PROVIDER_SITE_OTHER): Payer: 59 | Admitting: Physician Assistant

## 2014-01-06 ENCOUNTER — Encounter: Payer: Self-pay | Admitting: Physician Assistant

## 2014-01-06 VITALS — BP 108/60 | HR 95 | Temp 99.0°F | Wt 203.1 lb

## 2014-01-06 DIAGNOSIS — J329 Chronic sinusitis, unspecified: Secondary | ICD-10-CM | POA: Insufficient documentation

## 2014-01-06 MED ORDER — AZITHROMYCIN 250 MG PO TABS: ORAL_TABLET | ORAL | Status: DC

## 2014-01-06 NOTE — Assessment & Plan Note (Signed)
Rx azithromycin. Increase fluid intake. Rest. Saline nasal spray. Daily Zyrtec. Daily multivitamin. Mucinex. Place humidifier in bedroom. Please call return to clinic if symptoms not improving.

## 2014-01-06 NOTE — Progress Notes (Signed)
Patient presents to clinic today c/o greater than one week of sinus pressure, sinus pain, ear pain, postnasal drip and fatigue. Patient denies cough, shortness of breath or wheezing. Says fever, chills, aches. Denies recent travel or sick contact.  Past Medical History  Diagnosis Date  . Abnormal Pap smear   . History of chicken pox   . Nausea   . Headache(784.0)     migraines  . Hepatitis july 2005    unknown form of hepatitis, no current liver problems    Current Outpatient Prescriptions on File Prior to Visit  Medication Sig Dispense Refill  . clobetasol cream (TEMOVATE) 3.29 % Apply 1 application topically 2 (two) times daily.  60 g  2  . drospirenone-ethinyl estradiol (YASMIN,ZARAH,SYEDA) 3-0.03 MG tablet Take 1 tablet by mouth daily.      . SUMAtriptan (IMITREX) 100 MG tablet One tab at start of migraine, may repeat once 2 hours later as needed. Max 2 tabs/day  9 tablet  1   No current facility-administered medications on file prior to visit.    No Known Allergies  Family History  Problem Relation Age of Onset  . Cancer Mother     breast, leaukemia  . Heart disease Father   . Seizures Brother   . Diabetes Paternal Uncle   . Hypertension Maternal Grandmother   . Diabetes Maternal Grandmother   . Depression Maternal Grandmother     anxiety also  . Cancer Maternal Grandmother     breast  . Diabetes Paternal Grandmother   . Heart disease Paternal Grandfather   . Cancer Paternal Grandfather     colon  . Cancer Maternal Aunt     cervical    History   Social History  . Marital Status: Married    Spouse Name: N/A    Number of Children: N/A  . Years of Education: N/A   Social History Main Topics  . Smoking status: Former Smoker -- 0.25 packs/day for .5 years    Types: Cigarettes    Quit date: 10/11/2002  . Smokeless tobacco: Never Used  . Alcohol Use: No  . Drug Use: No  . Sexual Activity: Yes   Other Topics Concern  . None   Social History Narrative  .  None   Review of Systems - See HPI.  All other ROS are negative.  BP 108/60  Pulse 95  Temp(Src) 99 F (37.2 C) (Oral)  Wt 203 lb 1.3 oz (92.116 kg)  SpO2 96%  LMP 12/19/2013  Physical Exam  Vitals reviewed. Constitutional: She is oriented to person, place, and time and well-developed, well-nourished, and in no distress.  HENT:  Head: Normocephalic and atraumatic.  Right Ear: External ear normal.  Left Ear: External ear normal.  Nose: Nose normal.  Mouth/Throat: Oropharynx is clear and moist. No oropharyngeal exudate.  Tympanic membranes within normal limits bilaterally. Positive tenderness to percussion of sinuses, more pronounced at right frontal sinus.  Eyes: Conjunctivae are normal. Pupils are equal, round, and reactive to light.  Neck: Neck supple.  Cardiovascular: Normal rate, regular rhythm, normal heart sounds and intact distal pulses.   Pulmonary/Chest: Effort normal and breath sounds normal. No respiratory distress. She has no wheezes. She has no rales. She exhibits no tenderness.  Lymphadenopathy:    She has no cervical adenopathy.  Neurological: She is alert and oriented to person, place, and time.  Skin: Skin is warm and dry. No rash noted.  Psychiatric: Affect normal.    Recent Results (from the  past 2160 hour(s))  COMPREHENSIVE METABOLIC PANEL     Status: None   Collection Time    11/25/13  3:00 PM      Result Value Ref Range   Sodium 140  137 - 147 mEq/L   Potassium 4.2  3.7 - 5.3 mEq/L   Chloride 103  96 - 112 mEq/L   CO2 26  19 - 32 mEq/L   Glucose, Bld 91  70 - 99 mg/dL   BUN 8  6 - 23 mg/dL   Creatinine, Ser 0.75  0.50 - 1.10 mg/dL   Calcium 10.1  8.4 - 10.5 mg/dL   Total Protein 8.2  6.0 - 8.3 g/dL   Albumin 4.1  3.5 - 5.2 g/dL   AST 21  0 - 37 U/L   ALT 16  0 - 35 U/L   Alkaline Phosphatase 79  39 - 117 U/L   Total Bilirubin 0.3  0.3 - 1.2 mg/dL   GFR calc non Af Amer >90  >90 mL/min   GFR calc Af Amer >90  >90 mL/min   Comment: (NOTE)      The eGFR has been calculated using the CKD EPI equation.     This calculation has not been validated in all clinical situations.     eGFR's persistently <90 mL/min signify possible Chronic Kidney     Disease.  CBC     Status: None   Collection Time    11/25/13  3:00 PM      Result Value Ref Range   WBC 4.8  4.0 - 10.5 K/uL   RBC 4.67  3.87 - 5.11 MIL/uL   Hemoglobin 13.4  12.0 - 15.0 g/dL   HCT 37.8  36.0 - 46.0 %   MCV 80.9  78.0 - 100.0 fL   MCH 28.7  26.0 - 34.0 pg   MCHC 35.4  30.0 - 36.0 g/dL   RDW 12.2  11.5 - 15.5 %   Platelets 261  150 - 400 K/uL  HCG, SERUM, QUALITATIVE     Status: None   Collection Time    11/25/13  3:00 PM      Result Value Ref Range   Preg, Serum NEGATIVE  NEGATIVE   Comment:            THE SENSITIVITY OF THIS     METHODOLOGY IS >10 mIU/mL.    Assessment/Plan: Sinusitis Rx azithromycin. Increase fluid intake. Rest. Saline nasal spray. Daily Zyrtec. Daily multivitamin. Mucinex. Place humidifier in bedroom. Please call return to clinic if symptoms not improving.

## 2014-01-06 NOTE — Progress Notes (Signed)
Pre visit review using our clinic review tool, if applicable. No additional management support is needed unless otherwise documented below in the visit note. 

## 2014-01-06 NOTE — Patient Instructions (Signed)
Please take antibiotic as directed.  Increase fluid intake.  Use Saline nasal spray.  Take a daily multivitamin. Continue Zyrtec.  Place a humidifier in the bedroom.  Please call or return clinic if symptoms are not improving.  Sinusitis Sinusitis is redness, soreness, and swelling (inflammation) of the paranasal sinuses. Paranasal sinuses are air pockets within the bones of your face (beneath the eyes, the middle of the forehead, or above the eyes). In healthy paranasal sinuses, mucus is able to drain out, and air is able to circulate through them by way of your nose. However, when your paranasal sinuses are inflamed, mucus and air can become trapped. This can allow bacteria and other germs to grow and cause infection. Sinusitis can develop quickly and last only a short time (acute) or continue over a long period (chronic). Sinusitis that lasts for more than 12 weeks is considered chronic.  CAUSES  Causes of sinusitis include:  Allergies.  Structural abnormalities, such as displacement of the cartilage that separates your nostrils (deviated septum), which can decrease the air flow through your nose and sinuses and affect sinus drainage.  Functional abnormalities, such as when the small hairs (cilia) that line your sinuses and help remove mucus do not work properly or are not present. SYMPTOMS  Symptoms of acute and chronic sinusitis are the same. The primary symptoms are pain and pressure around the affected sinuses. Other symptoms include:  Upper toothache.  Earache.  Headache.  Bad breath.  Decreased sense of smell and taste.  A cough, which worsens when you are lying flat.  Fatigue.  Fever.  Thick drainage from your nose, which often is green and may contain pus (purulent).  Swelling and warmth over the affected sinuses. DIAGNOSIS  Your caregiver will perform a physical exam. During the exam, your caregiver may:  Look in your nose for signs of abnormal growths in your  nostrils (nasal polyps).  Tap over the affected sinus to check for signs of infection.  View the inside of your sinuses (endoscopy) with a special imaging device with a light attached (endoscope), which is inserted into your sinuses. If your caregiver suspects that you have chronic sinusitis, one or more of the following tests may be recommended:  Allergy tests.  Nasal culture A sample of mucus is taken from your nose and sent to a lab and screened for bacteria.  Nasal cytology A sample of mucus is taken from your nose and examined by your caregiver to determine if your sinusitis is related to an allergy. TREATMENT  Most cases of acute sinusitis are related to a viral infection and will resolve on their own within 10 days. Sometimes medicines are prescribed to help relieve symptoms (pain medicine, decongestants, nasal steroid sprays, or saline sprays).  However, for sinusitis related to a bacterial infection, your caregiver will prescribe antibiotic medicines. These are medicines that will help kill the bacteria causing the infection.  Rarely, sinusitis is caused by a fungal infection. In theses cases, your caregiver will prescribe antifungal medicine. For some cases of chronic sinusitis, surgery is needed. Generally, these are cases in which sinusitis recurs more than 3 times per year, despite other treatments. HOME CARE INSTRUCTIONS   Drink plenty of water. Water helps thin the mucus so your sinuses can drain more easily.  Use a humidifier.  Inhale steam 3 to 4 times a day (for example, sit in the bathroom with the shower running).  Apply a warm, moist washcloth to your face 3 to 4 times  a day, or as directed by your caregiver.  Use saline nasal sprays to help moisten and clean your sinuses.  Take over-the-counter or prescription medicines for pain, discomfort, or fever only as directed by your caregiver. SEEK IMMEDIATE MEDICAL CARE IF:  You have increasing pain or severe  headaches.  You have nausea, vomiting, or drowsiness.  You have swelling around your face.  You have vision problems.  You have a stiff neck.  You have difficulty breathing. MAKE SURE YOU:   Understand these instructions.  Will watch your condition.  Will get help right away if you are not doing well or get worse. Document Released: 08/19/2005 Document Revised: 11/11/2011 Document Reviewed: 09/03/2011 [facility] Patient Information 2014 [place], [place].

## 2014-03-09 ENCOUNTER — Ambulatory Visit (INDEPENDENT_AMBULATORY_CARE_PROVIDER_SITE_OTHER): Payer: 59 | Admitting: Physician Assistant

## 2014-03-09 ENCOUNTER — Ambulatory Visit: Payer: 59 | Admitting: Physician Assistant

## 2014-03-09 ENCOUNTER — Encounter: Payer: Self-pay | Admitting: Physician Assistant

## 2014-03-09 VITALS — BP 110/74 | HR 87 | Temp 98.7°F | Resp 16 | Ht 69.0 in | Wt 197.0 lb

## 2014-03-09 DIAGNOSIS — J0110 Acute frontal sinusitis, unspecified: Secondary | ICD-10-CM

## 2014-03-09 MED ORDER — AZITHROMYCIN 250 MG PO TABS: ORAL_TABLET | ORAL | Status: DC

## 2014-03-09 NOTE — Progress Notes (Signed)
Pre visit review using our clinic review tool, if applicable. No additional management support is needed unless otherwise documented below in the visit note/SLS  

## 2014-03-09 NOTE — Progress Notes (Signed)
Patient presents to clinic today c/o sinus pressure, sinus pain, ear pain, intermittent fevers and fatigue. Patient states symptoms have worsened over the past couple of days.  Denies cough, SOB or pleuritic chest pain.  Does endorse mild seasonal allergies but does not take anything for them.  Denies recent travel.    Past Medical History  Diagnosis Date  . Abnormal Pap smear   . History of chicken pox   . Nausea   . Headache(784.0)     migraines  . Hepatitis july 2005    unknown form of hepatitis, no current liver problems    Current Outpatient Prescriptions on File Prior to Visit  Medication Sig Dispense Refill  . clobetasol cream (TEMOVATE) 4.69 % Apply 1 application topically 2 (two) times daily.  60 g  2  . drospirenone-ethinyl estradiol (YASMIN,ZARAH,SYEDA) 3-0.03 MG tablet Take 1 tablet by mouth daily.      . SUMAtriptan (IMITREX) 100 MG tablet One tab at start of migraine, may repeat once 2 hours later as needed. Max 2 tabs/day  9 tablet  1   No current facility-administered medications on file prior to visit.    No Known Allergies  Family History  Problem Relation Age of Onset  . Cancer Mother     breast, leaukemia  . Heart disease Father   . Seizures Brother   . Diabetes Paternal Uncle   . Hypertension Maternal Grandmother   . Diabetes Maternal Grandmother   . Depression Maternal Grandmother     anxiety also  . Cancer Maternal Grandmother     breast  . Diabetes Paternal Grandmother   . Heart disease Paternal Grandfather   . Cancer Paternal Grandfather     colon  . Cancer Maternal Aunt     cervical    History   Social History  . Marital Status: Married    Spouse Name: N/A    Number of Children: N/A  . Years of Education: N/A   Social History Main Topics  . Smoking status: Former Smoker -- 0.25 packs/day for .5 years    Types: Cigarettes    Quit date: 10/11/2002  . Smokeless tobacco: Never Used  . Alcohol Use: No  . Drug Use: No  . Sexual Activity:  Yes   Other Topics Concern  . None   Social History Narrative  . None   Review of Systems - See HPI.  All other ROS are negative.  BP 110/74  Pulse 87  Temp(Src) 98.7 F (37.1 C) (Oral)  Resp 16  Ht 5\' 9"  (1.753 m)  Wt 197 lb (89.359 kg)  BMI 29.08 kg/m2  SpO2 98%  LMP 02/16/2014  Physical Exam  Vitals reviewed. Constitutional: She is oriented to person, place, and time and well-developed, well-nourished, and in no distress.  HENT:  Head: Normocephalic and atraumatic.  Right Ear: External ear and ear canal normal.  Left Ear: External ear and ear canal normal.  Nose: Right sinus exhibits frontal sinus tenderness. Right sinus exhibits no maxillary sinus tenderness. Left sinus exhibits frontal sinus tenderness. Left sinus exhibits no maxillary sinus tenderness.  Mouth/Throat: Uvula is midline, oropharynx is clear and moist and mucous membranes are normal. No oropharyngeal exudate.  Left TM with clear fluid noted behind TM in scant amount. Right TM within normal limits.  Eyes: Conjunctivae are normal. Pupils are equal, round, and reactive to light.  Neck: Neck supple.  Cardiovascular: Normal rate, regular rhythm, normal heart sounds and intact distal pulses.   Pulmonary/Chest: Effort normal  and breath sounds normal. No respiratory distress. She has no wheezes. She has no rales. She exhibits no tenderness.  Lymphadenopathy:    She has no cervical adenopathy.  Neurological: She is alert and oriented to person, place, and time.  Skin: Skin is warm and dry. No rash noted.  Psychiatric: Affect normal.   Assessment/Plan: Sinusitis Rx Azithromycin. Increase fluids.  Rest.  Saline nasal spray. Multivitamin. Daily Zyrtec. Place humidifier in bedroom.  Return precautions discussed with patient.

## 2014-03-09 NOTE — Assessment & Plan Note (Signed)
Rx Azithromycin. Increase fluids.  Rest.  Saline nasal spray. Multivitamin. Daily Zyrtec. Place humidifier in bedroom.  Return precautions discussed with patient.

## 2014-03-09 NOTE — Patient Instructions (Signed)
Please take antibiotic as directed.  Increase fluid intake.  Use Saline nasal spray.  Take a daily multivitamin. Take a daily Zyrtec to help with ear fullness and fluid behind ears.  Place a humidifier in the bedroom.  Please call or return clinic if symptoms are not improving.  Sinusitis Sinusitis is redness, soreness, and swelling (inflammation) of the paranasal sinuses. Paranasal sinuses are air pockets within the bones of your face (beneath the eyes, the middle of the forehead, or above the eyes). In healthy paranasal sinuses, mucus is able to drain out, and air is able to circulate through them by way of your nose. However, when your paranasal sinuses are inflamed, mucus and air can become trapped. This can allow bacteria and other germs to grow and cause infection. Sinusitis can develop quickly and last only a short time (acute) or continue over a long period (chronic). Sinusitis that lasts for more than 12 weeks is considered chronic.  CAUSES  Causes of sinusitis include:  Allergies.  Structural abnormalities, such as displacement of the cartilage that separates your nostrils (deviated septum), which can decrease the air flow through your nose and sinuses and affect sinus drainage.  Functional abnormalities, such as when the small hairs (cilia) that line your sinuses and help remove mucus do not work properly or are not present. SYMPTOMS  Symptoms of acute and chronic sinusitis are the same. The primary symptoms are pain and pressure around the affected sinuses. Other symptoms include:  Upper toothache.  Earache.  Headache.  Bad breath.  Decreased sense of smell and taste.  A cough, which worsens when you are lying flat.  Fatigue.  Fever.  Thick drainage from your nose, which often is green and may contain pus (purulent).  Swelling and warmth over the affected sinuses. DIAGNOSIS  Your caregiver will perform a physical exam. During the exam, your caregiver may:  Look in  your nose for signs of abnormal growths in your nostrils (nasal polyps).  Tap over the affected sinus to check for signs of infection.  View the inside of your sinuses (endoscopy) with a special imaging device with a light attached (endoscope), which is inserted into your sinuses. If your caregiver suspects that you have chronic sinusitis, one or more of the following tests may be recommended:  Allergy tests.  Nasal culture A sample of mucus is taken from your nose and sent to a lab and screened for bacteria.  Nasal cytology A sample of mucus is taken from your nose and examined by your caregiver to determine if your sinusitis is related to an allergy. TREATMENT  Most cases of acute sinusitis are related to a viral infection and will resolve on their own within 10 days. Sometimes medicines are prescribed to help relieve symptoms (pain medicine, decongestants, nasal steroid sprays, or saline sprays).  However, for sinusitis related to a bacterial infection, your caregiver will prescribe antibiotic medicines. These are medicines that will help kill the bacteria causing the infection.  Rarely, sinusitis is caused by a fungal infection. In theses cases, your caregiver will prescribe antifungal medicine. For some cases of chronic sinusitis, surgery is needed. Generally, these are cases in which sinusitis recurs more than 3 times per year, despite other treatments. HOME CARE INSTRUCTIONS   Drink plenty of water. Water helps thin the mucus so your sinuses can drain more easily.  Use a humidifier.  Inhale steam 3 to 4 times a day (for example, sit in the bathroom with the shower running).  Apply  a warm, moist washcloth to your face 3 to 4 times a day, or as directed by your caregiver.  Use saline nasal sprays to help moisten and clean your sinuses.  Take over-the-counter or prescription medicines for pain, discomfort, or fever only as directed by your caregiver. SEEK IMMEDIATE MEDICAL CARE  IF:  You have increasing pain or severe headaches.  You have nausea, vomiting, or drowsiness.  You have swelling around your face.  You have vision problems.  You have a stiff neck.  You have difficulty breathing. MAKE SURE YOU:   Understand these instructions.  Will watch your condition.  Will get help right away if you are not doing well or get worse. Document Released: 08/19/2005 Document Revised: 11/11/2011 Document Reviewed: 09/03/2011 [facility] Patient Information 2014 [place], [place].

## 2014-04-07 ENCOUNTER — Other Ambulatory Visit: Payer: Self-pay | Admitting: Family

## 2014-04-07 NOTE — Telephone Encounter (Signed)
Patient called in regarding this. She will be leaving in the morning for vacation and is hoping that this can be completed by this afternoon.

## 2014-05-25 ENCOUNTER — Ambulatory Visit (INDEPENDENT_AMBULATORY_CARE_PROVIDER_SITE_OTHER): Payer: 59 | Admitting: Family

## 2014-05-25 ENCOUNTER — Encounter: Payer: Self-pay | Admitting: Family

## 2014-05-25 VITALS — HR 91 | Temp 98.7°F | Resp 16 | Ht 69.0 in | Wt 203.1 lb

## 2014-05-25 DIAGNOSIS — B9789 Other viral agents as the cause of diseases classified elsewhere: Principal | ICD-10-CM

## 2014-05-25 DIAGNOSIS — J069 Acute upper respiratory infection, unspecified: Secondary | ICD-10-CM

## 2014-05-25 NOTE — Assessment & Plan Note (Signed)
Pt advised as follows:  Continue sudafed. Start zyrtec 10mg  once daily. Perform nasal saline rinses twice daily. Start flonase 2 sprays each nostril once daily. Call if symptoms worsen, if you develop fever >101 or if you are not feeling some better by Friday.

## 2014-05-25 NOTE — Patient Instructions (Signed)
Continue sudafed. Start zyrtec 10mg  once daily. Perform nasal saline rinses twice daily. Start flonase 2 sprays each nostril once daily. Call if symptoms worsen, if you develop fever >101 or if you are not feeling some better by Friday.

## 2014-05-25 NOTE — Progress Notes (Signed)
Subjective:    Patient ID: Shannon Hale, female    DOB: 1981/03/02, 33 y.o.   MRN: [id number]  HPI  Ms. Shannon Hale is a 33 yr old female who presents today with complaint of sinus congestion. Reports that her son had URI. Pt has sinus pressure, headache, ear fullness, no fever.  + drainage. She has tried sudafed and mucinex. Sudafed helps some.     Review of Systems See HPI     Past Medical History  Diagnosis Date  . Abnormal Pap smear   . History of chicken pox   . Nausea   . Headache(784.0)     migraines  . Hepatitis july 2005    unknown form of hepatitis, no current liver problems    History   Social History  . Marital Status: Married    Spouse Name: N/A    Number of Children: N/A  . Years of Education: N/A   Occupational History  . Not on file.   Social History Main Topics  . Smoking status: Former Smoker -- 0.25 packs/day for .5 years    Types: Cigarettes    Quit date: 10/11/2002  . Smokeless tobacco: Never Used  . Alcohol Use: No  . Drug Use: No  . Sexual Activity: Yes   Other Topics Concern  . Not on file   Social History Narrative  . No narrative on file    Past Surgical History  Procedure Laterality Date  . Tonsillectomy    . Septoplasty    . Wisdom tooth extraction    . No past surgeries    . Cholecystectomy N/A 12/01/2013    Procedure: LAPAROSCOPIC CHOLECYSTECTOMY ;  Surgeon: Harl Bowie, MD;  Location: [facility];  Service: General;  Laterality: N/A;    Family History  Problem Relation Age of Onset  . Cancer Mother     breast, leaukemia  . Heart disease Father   . Seizures Brother   . Diabetes Paternal Uncle   . Hypertension Maternal Grandmother   . Diabetes Maternal Grandmother   . Depression Maternal Grandmother     anxiety also  . Cancer Maternal Grandmother     breast  . Diabetes Paternal Grandmother   . Heart disease Paternal Grandfather   . Cancer Paternal Grandfather     colon  . Cancer Maternal Aunt     cervical      No Known Allergies  Current Outpatient Prescriptions on File Prior to Visit  Medication Sig Dispense Refill  . clobetasol cream (TEMOVATE) 6.16 % Apply 1 application topically 2 (two) times daily.  60 g  2  . SUMAtriptan (IMITREX) 100 MG tablet TAKE 1 TABLET BY MOUTH AT THE START OF MIGRAINE, MAY REPEAT ONCE 2 HOURS LATER AS NEEDED. MAX 2 TABS PER DAY  9 tablet  1   No current facility-administered medications on file prior to visit.    Pulse 91  Temp(Src) 98.7 F (37.1 C) (Oral)  Resp 16  Ht 5\' 9"  (1.753 m)  Wt 203 lb 2 oz (92.137 kg)  BMI 29.98 kg/m2  SpO2 99%  LMP 05/11/2014    Objective:   Physical Exam  Constitutional: She is oriented to person, place, and time. She appears well-developed and well-nourished. No distress.  HENT:  Head: Normocephalic and atraumatic.  Right Ear: Tympanic membrane and ear canal normal.  Left Ear: Tympanic membrane and ear canal normal.  Mouth/Throat: No oropharyngeal exudate, posterior oropharyngeal edema or posterior oropharyngeal erythema.  Mild frontal and maxillary  sinus tenderness to palpation.  Cardiovascular: Normal rate and regular rhythm.   No murmur heard. Pulmonary/Chest: Effort normal and breath sounds normal. No respiratory distress. She has no wheezes. She has no rales. She exhibits no tenderness.  Lymphadenopathy:    She has no cervical adenopathy.  Neurological: She is alert and oriented to person, place, and time.  Psychiatric: She has a normal mood and affect. Her behavior is normal. Judgment and thought content normal.          Assessment & Plan:

## 2014-05-25 NOTE — Progress Notes (Signed)
Pre visit review using our clinic review tool, if applicable. No additional management support is needed unless otherwise documented below in the visit note/SLS  

## 2014-05-27 ENCOUNTER — Telehealth: Payer: Self-pay | Admitting: Family

## 2014-05-27 MED ORDER — AMOXICILLIN-POT CLAVULANATE 875-125 MG PO TABS: 1.0000 | ORAL_TABLET | Freq: Two times a day (BID) | ORAL | Status: DC

## 2014-05-27 NOTE — Telephone Encounter (Signed)
Notified pt. 

## 2014-05-27 NOTE — Telephone Encounter (Signed)
Please Advise

## 2014-05-27 NOTE — Telephone Encounter (Signed)
Rx sent to pharmacy for augmentin.  Please notify pt to contact us if no improvement or if symptoms worsen after starting augmentin.

## 2014-05-27 NOTE — Telephone Encounter (Signed)
Caller name:Wheelwright, Arlyss Repress Relation to KG:SUPJ Call back number:[phone] Pharmacy:[facility]  Reason for call: pt states melissa informed her to call if her symptoms got worse, and she would call her an rx in today. Pt was seen Wednesday for sinus infection. Pt states she has a lot of drainage that is going into her chest and ears.

## 2014-07-04 ENCOUNTER — Encounter: Payer: Self-pay | Admitting: Family

## 2014-07-12 ENCOUNTER — Encounter (HOSPITAL_COMMUNITY): Payer: Self-pay | Admitting: *Deleted

## 2014-07-12 NOTE — H&P (Addendum)
Shannon Hale is an 33 y.o. female  G3P1 presents for surgical mngt of missed Ab.  No vb or pain     Past Medical History  Diagnosis Date  . Abnormal Pap smear   . History of chicken pox   . Nausea   . Headache(784.0)     migraines  . PONV (postoperative nausea and vomiting)   . Hepatitis july 2005    unknown form of hepatitis, no current liver problems    Past Surgical History  Procedure Laterality Date  . Tonsillectomy    . Septoplasty    . Wisdom tooth extraction    . No past surgeries    . Cholecystectomy N/A 12/01/2013    Procedure: LAPAROSCOPIC CHOLECYSTECTOMY ;  Surgeon: Harl Bowie, MD;  Location: [facility];  Service: General;  Laterality: N/A;    Family History  Problem Relation Age of Onset  . Cancer Mother     breast, leaukemia  . Heart disease Father   . Seizures Brother   . Diabetes Paternal Uncle   . Hypertension Maternal Grandmother   . Diabetes Maternal Grandmother   . Depression Maternal Grandmother     anxiety also  . Cancer Maternal Grandmother     breast  . Diabetes Paternal Grandmother   . Heart disease Paternal Grandfather   . Cancer Paternal Grandfather     colon  . Cancer Maternal Aunt     cervical    Social History:  reports that she quit smoking about 11 years ago. Her smoking use included Cigarettes. She has a .125 pack-year smoking history. She has never used smokeless tobacco. She reports that she does not drink alcohol or use illicit drugs.  Allergies: No Known Allergies  No prescriptions prior to admission    ROS  There were no vitals taken for this visit. Physical Exam Gen - NAD Abd - soft, NT PV - cvx closed  [place] IUP @ 6wks  Assessment/Plan: Dilation and evacuation R/b/a discussed, questions answered, informed consent  Levetta Bognar 07/12/2014, 4:17 PM

## 2014-07-13 MED ORDER — DEXTROSE 5 % IV SOLN
2.0000 g | INTRAVENOUS | Status: AC
  Administered 2014-07-14: 2 g via INTRAVENOUS
  Filled 2014-07-13: qty 2

## 2014-07-14 ENCOUNTER — Ambulatory Visit (HOSPITAL_COMMUNITY): Payer: 59 | Admitting: Anesthesiology

## 2014-07-14 ENCOUNTER — Ambulatory Visit (HOSPITAL_COMMUNITY)
Admission: RE | Admit: 2014-07-14 | Discharge: 2014-07-14 | Disposition: A | Discharge status: Home / Self Care | Payer: 59 | Source: Ambulatory Visit | Attending: Obstetrics and Gynecology | Admitting: Obstetrics and Gynecology

## 2014-07-14 ENCOUNTER — Encounter (HOSPITAL_COMMUNITY)
Admission: RE | Disposition: A | Discharge status: Home / Self Care | Payer: Self-pay | Source: Ambulatory Visit | Attending: Obstetrics and Gynecology

## 2014-07-14 DIAGNOSIS — O021 Missed abortion: Secondary | ICD-10-CM | POA: Diagnosis not present

## 2014-07-14 DIAGNOSIS — Z87891 Personal history of nicotine dependence: Secondary | ICD-10-CM | POA: Diagnosis not present

## 2014-07-14 HISTORY — DX: Other specified postprocedural states: Z98.890

## 2014-07-14 HISTORY — PX: DILATION AND EVACUATION: SHX1459

## 2014-07-14 HISTORY — DX: Other specified postprocedural states: R11.2

## 2014-07-14 LAB — CBC
HCT: 35.9 % — ABNORMAL LOW (ref 36.0–46.0)
Hemoglobin: 12.5 g/dL (ref 12.0–15.0)
MCH: 28.9 pg (ref 26.0–34.0)
MCHC: 34.8 g/dL (ref 30.0–36.0)
MCV: 82.9 fL (ref 78.0–100.0)
Platelets: 267 10*3/uL (ref 150–400)
RBC: 4.33 MIL/uL (ref 3.87–5.11)
RDW: 12.6 % (ref 11.5–15.5)
WBC: 7.2 10*3/uL (ref 4.0–10.5)

## 2014-07-14 SURGERY — DILATION AND EVACUATION, UTERUS
Anesthesia: Monitor Anesthesia Care | Site: Vagina

## 2014-07-14 MED ORDER — MIDAZOLAM HCL 2 MG/2ML IJ SOLN
INTRAMUSCULAR | Status: DC | PRN
  Administered 2014-07-14: 2 mg via INTRAVENOUS

## 2014-07-14 MED ORDER — SCOPOLAMINE 1 MG/3DAYS TD PT72
MEDICATED_PATCH | TRANSDERMAL | Status: AC
  Administered 2014-07-14: 1.5 mg via TRANSDERMAL
  Filled 2014-07-14: qty 1

## 2014-07-14 MED ORDER — DEXAMETHASONE SODIUM PHOSPHATE 10 MG/ML IJ SOLN
INTRAMUSCULAR | Status: AC
  Filled 2014-07-14: qty 1

## 2014-07-14 MED ORDER — FENTANYL CITRATE 0.05 MG/ML IJ SOLN
INTRAMUSCULAR | Status: AC
  Filled 2014-07-14: qty 2

## 2014-07-14 MED ORDER — MIDAZOLAM HCL 2 MG/2ML IJ SOLN: 0.5000 mg | Freq: Once | INTRAMUSCULAR | Status: DC | PRN

## 2014-07-14 MED ORDER — METHYLERGONOVINE MALEATE 0.2 MG/ML IJ SOLN
INTRAMUSCULAR | Status: DC | PRN
  Administered 2014-07-14: 0.2 mg via INTRAMUSCULAR

## 2014-07-14 MED ORDER — ONDANSETRON HCL 4 MG/2ML IJ SOLN
INTRAMUSCULAR | Status: AC
  Filled 2014-07-14: qty 2

## 2014-07-14 MED ORDER — FENTANYL CITRATE 0.05 MG/ML IJ SOLN: 25.0000 ug | INTRAMUSCULAR | Status: DC | PRN

## 2014-07-14 MED ORDER — MIDAZOLAM HCL 2 MG/2ML IJ SOLN
INTRAMUSCULAR | Status: AC
  Filled 2014-07-14: qty 2

## 2014-07-14 MED ORDER — MEPERIDINE HCL 25 MG/ML IJ SOLN: 6.2500 mg | INTRAMUSCULAR | Status: DC | PRN

## 2014-07-14 MED ORDER — DEXAMETHASONE SODIUM PHOSPHATE 10 MG/ML IJ SOLN
INTRAMUSCULAR | Status: DC | PRN
  Administered 2014-07-14: 10 mg via INTRAVENOUS

## 2014-07-14 MED ORDER — KETOROLAC TROMETHAMINE 30 MG/ML IJ SOLN
INTRAMUSCULAR | Status: DC | PRN
  Administered 2014-07-14: 30 mg via INTRAVENOUS

## 2014-07-14 MED ORDER — CHLOROPROCAINE HCL 1 % IJ SOLN
INTRAMUSCULAR | Status: AC
  Filled 2014-07-14: qty 30

## 2014-07-14 MED ORDER — PROMETHAZINE HCL 25 MG/ML IJ SOLN: 6.2500 mg | INTRAMUSCULAR | Status: DC | PRN

## 2014-07-14 MED ORDER — ONDANSETRON HCL 4 MG/2ML IJ SOLN
INTRAMUSCULAR | Status: DC | PRN
  Administered 2014-07-14: 4 mg via INTRAVENOUS

## 2014-07-14 MED ORDER — FENTANYL CITRATE 0.05 MG/ML IJ SOLN
INTRAMUSCULAR | Status: DC | PRN
  Administered 2014-07-14 (×2): 50 ug via INTRAVENOUS

## 2014-07-14 MED ORDER — LIDOCAINE HCL (CARDIAC) 20 MG/ML IV SOLN
INTRAVENOUS | Status: AC
  Filled 2014-07-14: qty 5

## 2014-07-14 MED ORDER — KETOROLAC TROMETHAMINE 30 MG/ML IJ SOLN: 15.0000 mg | Freq: Once | INTRAMUSCULAR | Status: DC | PRN

## 2014-07-14 MED ORDER — METHYLERGONOVINE MALEATE 0.2 MG PO TABS: 0.2000 mg | ORAL_TABLET | Freq: Three times a day (TID) | ORAL | Status: DC

## 2014-07-14 MED ORDER — LACTATED RINGERS IV SOLN
INTRAVENOUS | Status: DC
  Administered 2014-07-14 (×2): via INTRAVENOUS

## 2014-07-14 MED ORDER — PROPOFOL 10 MG/ML IV EMUL
INTRAVENOUS | Status: DC | PRN
  Administered 2014-07-14: 50 mg via INTRAVENOUS

## 2014-07-14 MED ORDER — KETOROLAC TROMETHAMINE 30 MG/ML IJ SOLN
INTRAMUSCULAR | Status: AC
  Filled 2014-07-14: qty 1

## 2014-07-14 MED ORDER — SCOPOLAMINE 1 MG/3DAYS TD PT72
1.0000 | MEDICATED_PATCH | Freq: Once | TRANSDERMAL | Status: DC
  Administered 2014-07-14: 1.5 mg via TRANSDERMAL

## 2014-07-14 MED ORDER — PROPOFOL INFUSION 10 MG/ML OPTIME
INTRAVENOUS | Status: DC | PRN
Start: 2014-07-14 — End: 2014-07-14
  Administered 2014-07-14: 50 ug/kg/min via INTRAVENOUS

## 2014-07-14 MED ORDER — LIDOCAINE HCL (CARDIAC) 20 MG/ML IV SOLN
INTRAVENOUS | Status: DC | PRN
  Administered 2014-07-14: 80 mg via INTRAVENOUS

## 2014-07-14 SURGICAL SUPPLY — 18 items
CATH ROBINSON RED A/P 16FR (CATHETERS) ×2 IMPLANT
CLOTH BEACON ORANGE TIMEOUT ST (SAFETY) ×2 IMPLANT
DECANTER SPIKE VIAL GLASS SM (MISCELLANEOUS) ×2 IMPLANT
GLOVE BIO SURGEON STRL SZ 6.5 (GLOVE) ×2 IMPLANT
GLOVE BIOGEL PI IND STRL 7.0 (GLOVE) ×1 IMPLANT
GLOVE BIOGEL PI INDICATOR 7.0 (GLOVE) ×1
GOWN STRL REUS W/TWL LRG LVL3 (GOWN DISPOSABLE) ×4 IMPLANT
KIT BERKELEY 1ST TRIMESTER 3/8 (MISCELLANEOUS) ×2 IMPLANT
NS IRRIG 1000ML POUR BTL (IV SOLUTION) ×2 IMPLANT
PACK VAGINAL MINOR WOMEN LF (CUSTOM PROCEDURE TRAY) ×2 IMPLANT
PAD OB MATERNITY 4.3X12.25 (PERSONAL CARE ITEMS) ×2 IMPLANT
PAD PREP 24X48 CUFFED NSTRL (MISCELLANEOUS) ×2 IMPLANT
SET BERKELEY SUCTION TUBING (SUCTIONS) ×2 IMPLANT
TOWEL OR 17X24 6PK STRL BLUE (TOWEL DISPOSABLE) ×4 IMPLANT
VACURETTE 10 RIGID CVD (CANNULA) IMPLANT
VACURETTE 7MM CVD STRL WRAP (CANNULA) IMPLANT
VACURETTE 8 RIGID CVD (CANNULA) IMPLANT
VACURETTE 9 RIGID CVD (CANNULA) IMPLANT

## 2014-07-14 NOTE — Anesthesia Preprocedure Evaluation (Signed)
Anesthesia Evaluation  Patient identified by MRN, date of birth, ID band Patient awake    Reviewed: Allergy & Precautions, H&P , Patient's Chart, lab work & pertinent test results, reviewed documented beta blocker date and time   History of Anesthesia Complications Negative for: history of anesthetic complications  Airway Mallampati: II  TM Distance: >3 FB Neck ROM: full    Dental   Pulmonary former smoker,  breath sounds clear to auscultation        Cardiovascular Exercise Tolerance: Good Rhythm:regular Rate:Normal     Neuro/Psych negative psych ROS   GI/Hepatic   Endo/Other    Renal/GU      Musculoskeletal   Abdominal   Peds  Hematology   Anesthesia Other Findings Hepatitis resolved  Reproductive/Obstetrics                             Anesthesia Physical Anesthesia Plan  ASA: II  Anesthesia Plan: MAC   Post-op Pain Management:    Induction:   Airway Management Planned:   Additional Equipment:   Intra-op Plan:   Post-operative Plan:   Informed Consent: I have reviewed the patients History and Physical, chart, labs and discussed the procedure including the risks, benefits and alternatives for the proposed anesthesia with the patient or authorized representative who has indicated his/her understanding and acceptance.   Dental Advisory Given  Plan Discussed with: CRNA, Surgeon and Anesthesiologist  Anesthesia Plan Comments:         Anesthesia Quick Evaluation

## 2014-07-14 NOTE — Discharge Instructions (Signed)
DISCHARGE INSTRUCTIONS: D&C / D&E The following instructions have been prepared to help you care for yourself upon your return home.  No Ibuprofen containing products (ie. Advil, Aleve, Motrin, etc.) until after 7:30 pm tonight.   Personal hygiene:  Use sanitary pads for vaginal drainage, not tampons.  Shower the day after your procedure.  NO tub baths, pools or Jacuzzis for 2-3 weeks.  Wipe front to back after using the bathroom.  Activity and limitations:  Do NOT drive or operate any equipment for 24 hours. The effects of anesthesia are still present and drowsiness may result.  Do NOT rest in bed all day.  Walking is encouraged.  Walk up and down stairs slowly.  You may resume your normal activity in one to two days or as indicated by your physician.  Sexual activity: NO intercourse for at least 2 weeks after the procedure, or as indicated by your physician.  Diet: Eat a light meal as desired this evening. You may resume your usual diet tomorrow.  Return to work: You may resume your work activities in one to two days or as indicated by your doctor.  What to expect after your surgery: Expect to have vaginal bleeding/discharge for 2-3 days and spotting for up to 10 days. It is not unusual to have soreness for up to 1-2 weeks. You may have a slight burning sensation when you urinate for the first day. Mild cramps may continue for a couple of days. You may have a regular period in 2-6 weeks.  Call your doctor for any of the following:  Excessive vaginal bleeding, saturating and changing one pad every hour.  Inability to urinate 6 hours after discharge from hospital.  Pain not relieved by pain medication.  Fever of 100.4 F or greater.  Unusual vaginal discharge or odor.   Call for an appointment:    Patients signature: ______________________  Nurses signature ________________________  Support person's signature_______________________

## 2014-07-14 NOTE — Transfer of Care (Signed)
Immediate Anesthesia Transfer of Care Note  Patient: Shannon Hale  Procedure(s) Performed: Procedure(s): DILATATION AND EVACUATION (N/A)  Patient Location: PACU  Anesthesia Type:MAC  Level of Consciousness: awake, alert  and oriented  Airway & Oxygen Therapy: Patient Spontanous Breathing  Post-op Assessment: Report given to PACU RN, Post -op Vital signs reviewed and stable and Patient moving all extremities  Post vital signs: Reviewed and stable  Complications: No apparent anesthesia complications

## 2014-07-14 NOTE — Anesthesia Postprocedure Evaluation (Signed)
  Anesthesia Post Note  Patient: Shannon Hale  Procedure(s) Performed: Procedure(s) (LRB): DILATATION AND EVACUATION (N/A)  Anesthesia type: MAC  Patient location: PACU  Post pain: Pain level controlled  Post assessment: Post-op Vital signs reviewed  Last Vitals:  Filed Vitals:   07/14/14 1415  BP: 107/57  Pulse: 66  Temp: 36.9 C  Resp: 20    Post vital signs: Reviewed  Level of consciousness: sedated  Complications: No apparent anesthesia complications

## 2014-07-15 ENCOUNTER — Encounter (HOSPITAL_COMMUNITY): Payer: Self-pay | Admitting: Obstetrics and Gynecology

## 2014-07-15 NOTE — Op Note (Signed)
Shannon Hale, JAMISON NO.:  [id number]  MEDICAL RECORD NO.:  [id number]  LOCATION:  [facility]                          FACILITY:  [facility]  PHYSICIAN:  Marylynn Pearson, MD    DATE OF BIRTH:  1981/01/22  DATE OF PROCEDURE:  07/14/2014 DATE OF DISCHARGE:  07/14/2014                              OPERATIVE REPORT   PREOPERATIVE DIAGNOSIS:  Missed abortion.  POSTOPERATIVE DIAGNOSIS:  Missed abortion.  PROCEDURE:  Dilation and evacuation.  SURGEON:  Marylynn Pearson, MD  ANESTHESIA:  MAC.  SPECIMEN:  Products of conception.  COMPLICATIONS:  None.  CONDITION:  Stable to recovery room.  PROCEDURE IN DETAIL:  The patient was taken to the operating room. After informed consent was obtained, she was placed in the dorsal lithotomy position and given anesthesia.  She was prepped and draped in sterile fashion.  An in and out catheter was used to drain her bladder. Bivalve speculum was placed in the vagina and single-tooth tenaculum attached to the anterior lip of the cervix.  The cervix was easily dilated using Pratt dilators and a 7-French suction catheter was inserted.  Products of conception were removed.  A gentle curetting was then performed and a uterine cry was noted throughout.  Once the uterine cry was noted, suction curette was inserted one last time to remove any clots and debris.  The tenaculum was removed from the cervix.  The cervix was hemostatic.  Speculum was removed.  She was taken to the recovery room in stable condition.  Sponge, lap, needle, and instrument counts were correct x2.     Marylynn Pearson, MD     GA/MEDQ  D:  07/14/2014  T:  07/15/2014  Job:  [id number]

## 2014-09-21 ENCOUNTER — Ambulatory Visit (INDEPENDENT_AMBULATORY_CARE_PROVIDER_SITE_OTHER): Payer: 59 | Admitting: Physician Assistant

## 2014-09-21 ENCOUNTER — Encounter: Payer: Self-pay | Admitting: Physician Assistant

## 2014-09-21 VITALS — BP 124/57 | HR 87 | Temp 98.8°F | Resp 16 | Ht 69.0 in | Wt 214.2 lb

## 2014-09-21 DIAGNOSIS — E669 Obesity, unspecified: Secondary | ICD-10-CM

## 2014-09-21 MED ORDER — PHENTERMINE HCL 37.5 MG PO CAPS: 37.5000 mg | ORAL_CAPSULE | ORAL | Status: DC

## 2014-09-21 NOTE — Patient Instructions (Signed)
Please take medication daily as directed.  Continue diet and exercise regimen.  Follow-up with me in 1 month.  If you become pregnant, you are to stop the medication immediately.  Also, if you notice racing heart, insomnia or anorexia, please stop medication and call us.  Exercise to Lose Weight Exercise and a healthy diet may help you lose weight. Your doctor may suggest specific exercises. EXERCISE IDEAS AND TIPS  Choose low-cost things you enjoy doing, such as walking, bicycling, or exercising to workout videos.  Take stairs instead of the elevator.  Walk during your lunch break.  Park your car further away from work or school.  Go to a gym or an exercise class.  Start with 5 to 10 minutes of exercise each day. Build up to 30 minutes of exercise 4 to 6 days a week.  Wear shoes with good support and comfortable clothes.  Stretch before and after working out.  Work out until you breathe harder and your heart beats faster.  Drink extra water when you exercise.  Do not do so much that you hurt yourself, feel dizzy, or get very short of breath. Exercises that burn about 150 calories:  Running 1  miles in 15 minutes.  Playing volleyball for 45 to 60 minutes.  Washing and waxing a car for 45 to 60 minutes.  Playing touch football for 45 minutes.  Walking 1  miles in 35 minutes.  Pushing a stroller 1  miles in 30 minutes.  Playing basketball for 30 minutes.  Raking leaves for 30 minutes.  Bicycling 5 miles in 30 minutes.  Walking 2 miles in 30 minutes.  Dancing for 30 minutes.  Shoveling snow for 15 minutes.  Swimming laps for 20 minutes.  Walking up stairs for 15 minutes.  Bicycling 4 miles in 15 minutes.  Gardening for 30 to 45 minutes.  Jumping rope for 15 minutes.  Washing windows or floors for 45 to 60 minutes. Document Released: 09/21/2010 Document Revised: 11/11/2011 Document Reviewed: 09/21/2010 [facility] Patient Information 2015 [place], [place].  This information is not intended to replace advice given to you by your health care provider. Make sure you discuss any questions you have with your health care provider.

## 2014-09-25 DIAGNOSIS — E669 Obesity, unspecified: Secondary | ICD-10-CM | POA: Insufficient documentation

## 2014-09-25 NOTE — Progress Notes (Signed)
Patient presents to clinic today to discuss weight loss options. Patient with Body mass index is 31.62 kg/(m^2). patient endorses attempting multiple things to lose weight. Is currently on a calorie restricted diet. Is going to the gym 3-4 times per week. Is also walking 3 miles a day. Denies significant improvement in weight. Patient denies history of thyroid disorder.  Past Medical History  Diagnosis Date  . Abnormal Pap smear   . History of chicken pox   . Nausea   . Headache(784.0)     migraines  . PONV (postoperative nausea and vomiting)   . Hepatitis july 2005    unknown form of hepatitis, no current liver problems  . History of D&C     D&E    Current Outpatient Prescriptions on File Prior to Visit  Medication Sig Dispense Refill  . clobetasol cream (TEMOVATE) 8.41 % Apply 1 application topically 2 (two) times daily. (Patient taking differently: Apply 1 application topically 2 (two) times daily as needed. ) 60 g 2  . ibuprofen (ADVIL,MOTRIN) 200 MG tablet Take 800 mg by mouth every 6 (six) hours as needed for headache.    . SUMAtriptan (IMITREX) 100 MG tablet Take 100 mg by mouth every 2 (two) hours as needed for migraine or headache (Take one tablet at start of headache then may repeat once 2 hours later as needed; Do not exceed 2 tablest per day.). May repeat in 2 hours if headache persists or recurs.     No current facility-administered medications on file prior to visit.    No Known Allergies  Family History  Problem Relation Age of Onset  . Cancer Mother     breast, leaukemia-Living  . Heart disease Father     Living  . Seizures Brother     Resolved  . Diabetes Paternal Uncle   . Hypertension Maternal Grandmother   . Diabetes Maternal Grandmother   . Depression Maternal Grandmother     anxiety also  . Cancer Maternal Grandmother     breast  . Diabetes Paternal Grandmother   . Heart disease Paternal Grandfather   . Cancer Paternal Grandfather     colon  .  Cancer Maternal Aunt     cervical  . Healthy Brother     x2  . Healthy Son     x1    History   Social History  . Marital Status: Married    Spouse Name: N/A    Number of Children: N/A  . Years of Education: N/A   Social History Main Topics  . Smoking status: Former Smoker -- 0.25 packs/day for .5 years    Types: Cigarettes    Quit date: 10/11/2002  . Smokeless tobacco: Never Used  . Alcohol Use: No  . Drug Use: No  . Sexual Activity: Yes   Other Topics Concern  . None   Social History Narrative   Review of Systems - See HPI.  All other ROS are negative.  BP 124/57 mmHg  Pulse 87  Temp(Src) 98.8 F (37.1 C) (Oral)  Resp 16  Ht 5\' 9"  (1.753 m)  Wt 214 lb 4 oz (97.183 kg)  BMI 31.62 kg/m2  SpO2 99%  LMP 08/16/2014  Physical Exam  Constitutional: She is oriented to person, place, and time and well-developed, well-nourished, and in no distress.  HENT:  Head: Normocephalic and atraumatic.  Neck: Neck supple. No thyromegaly present.  Cardiovascular: Normal rate, regular rhythm, normal heart sounds and intact distal pulses.  Pulmonary/Chest: Effort normal and breath sounds normal. No respiratory distress. She has no wheezes. She has no rales. She exhibits no tenderness.  Neurological: She is alert and oriented to person, place, and time.  Skin: Skin is warm and dry. No rash noted.  Psychiatric: Affect normal.  Vitals reviewed.  Recent Results (from the past 2160 hour(s))  CBC     Status: Abnormal   Collection Time: 07/14/14 12:00 PM  Result Value Ref Range   WBC 7.2 4.0 - 10.5 K/uL   RBC 4.33 3.87 - 5.11 MIL/uL   Hemoglobin 12.5 12.0 - 15.0 g/dL   HCT 35.9 (L) 36.0 - 46.0 %   MCV 82.9 78.0 - 100.0 fL   MCH 28.9 26.0 - 34.0 pg   MCHC 34.8 30.0 - 36.0 g/dL   RDW 12.6 11.5 - 15.5 %   Platelets 267 150 - 400 K/uL    Assessment/Plan: Obesity Patient is artery attempted a 2 month trial of significant therapeutic lifestyle interventions without much success.  Patient encouraged to continue these measures. Discussed prescription weight loss options. After discussion, patient wishes to proceed with a trial of phentermine. We'll begin a 3 month trial of phentermine. Follow up in 1 month for repeat assessment including blood pressure, pulse and weight. Adverse reactions of medication discussed with patient. She is to stop the medication immediately if she notices any of these things.

## 2014-09-25 NOTE — Assessment & Plan Note (Signed)
Patient is artery attempted a 2 month trial of significant therapeutic lifestyle interventions without much success. Patient encouraged to continue these measures. Discussed prescription weight loss options. After discussion, patient wishes to proceed with a trial of phentermine. We'll begin a 3 month trial of phentermine. Follow up in 1 month for repeat assessment including blood pressure, pulse and weight. Adverse reactions of medication discussed with patient. She is to stop the medication immediately if she notices any of these things.

## 2014-10-19 ENCOUNTER — Ambulatory Visit (INDEPENDENT_AMBULATORY_CARE_PROVIDER_SITE_OTHER): Payer: 59 | Admitting: Physician Assistant

## 2014-10-19 ENCOUNTER — Encounter: Payer: Self-pay | Admitting: Physician Assistant

## 2014-10-19 VITALS — BP 113/45 | HR 83 | Temp 99.1°F | Resp 16 | Ht 69.0 in | Wt 204.1 lb

## 2014-10-19 DIAGNOSIS — E669 Obesity, unspecified: Secondary | ICD-10-CM

## 2014-10-19 MED ORDER — PHENTERMINE HCL 37.5 MG PO CAPS: 37.5000 mg | ORAL_CAPSULE | ORAL | Status: DC

## 2014-10-19 NOTE — Progress Notes (Signed)
Patient presents to clinic today for 12-month follow-up of obesity after starting Phentermine 37.5 mg daily.  Endorses taking medication as directed.  Has continue diet and exercise regimen.  Has had a 10 pound weight loss over past month.  Denies anorexia, insomnia or palpitations.  Past Medical History  Diagnosis Date  . Abnormal Pap smear   . History of chicken pox   . Nausea   . Headache(784.0)     migraines  . PONV (postoperative nausea and vomiting)   . Hepatitis july 2005    unknown form of hepatitis, no current liver problems  . History of D&C     D&E    Current Outpatient Prescriptions on File Prior to Visit  Medication Sig Dispense Refill  . clobetasol cream (TEMOVATE) 9.74 % Apply 1 application topically 2 (two) times daily. (Patient taking differently: Apply 1 application topically 2 (two) times daily as needed. ) 60 g 2  . ibuprofen (ADVIL,MOTRIN) 200 MG tablet Take 800 mg by mouth every 6 (six) hours as needed for headache.    . SUMAtriptan (IMITREX) 100 MG tablet Take 100 mg by mouth every 2 (two) hours as needed for migraine or headache (Take one tablet at start of headache then may repeat once 2 hours later as needed; Do not exceed 2 tablest per day.). May repeat in 2 hours if headache persists or recurs.     No current facility-administered medications on file prior to visit.    No Known Allergies  Family History  Problem Relation Age of Onset  . Cancer Mother     breast, leaukemia-Living  . Heart disease Father     Living  . Seizures Brother     Resolved  . Diabetes Paternal Uncle   . Hypertension Maternal Grandmother   . Diabetes Maternal Grandmother   . Depression Maternal Grandmother     anxiety also  . Cancer Maternal Grandmother     breast  . Diabetes Paternal Grandmother   . Heart disease Paternal Grandfather   . Cancer Paternal Grandfather     colon  . Cancer Maternal Aunt     cervical  . Healthy Brother     x2  . Healthy Son     x1     History   Social History  . Marital Status: Married    Spouse Name: N/A  . Number of Children: N/A  . Years of Education: N/A   Social History Main Topics  . Smoking status: Former Smoker -- 0.25 packs/day for .5 years    Types: Cigarettes    Quit date: 10/11/2002  . Smokeless tobacco: Never Used  . Alcohol Use: No  . Drug Use: No  . Sexual Activity: Yes   Other Topics Concern  . None   Social History Narrative   Review of Systems - See HPI.  All other ROS are negative.  BP 113/45 mmHg  Pulse 83  Temp(Src) 99.1 F (37.3 C) (Oral)  Resp 16  Ht 5\' 9"  (1.753 m)  Wt 204 lb 2 oz (92.59 kg)  BMI 30.13 kg/m2  SpO2 100%  LMP 09/20/2014  Physical Exam  Constitutional: She is oriented to person, place, and time and well-developed, well-nourished, and in no distress.  HENT:  Head: Normocephalic and atraumatic.  Eyes: Conjunctivae are normal.  Neck: Neck supple.  Cardiovascular: Normal rate, regular rhythm, normal heart sounds and intact distal pulses.   Pulmonary/Chest: Effort normal and breath sounds normal. No respiratory distress. She has no wheezes.  She has no rales. She exhibits no tenderness.  Neurological: She is alert and oriented to person, place, and time.  Skin: Skin is warm and dry. No rash noted.  Psychiatric: Affect normal.  Vitals reviewed.  Assessment/Plan: Obesity Doing well on current regimen.  Vitals within normal limits. No side effects per patient.  10 pound weight loss over the month.  Will continue current medication for 2 additional months.  Follow-up 2 months.

## 2014-10-19 NOTE — Patient Instructions (Signed)
I am glad you are doing well on this regimen.  Please continue the medication for another 2 months -- you have 1 refill left on your current prescription.  I have given you another prescription for your final month of medication.  Follow-up with me once medications have run out.   Please return sooner if you develop racing heart or severe weight loss.

## 2014-10-19 NOTE — Assessment & Plan Note (Signed)
Doing well on current regimen.  Vitals within normal limits. No side effects per patient.  10 pound weight loss over the month.  Will continue current medication for 2 additional months.  Follow-up 2 months.

## 2014-11-07 ENCOUNTER — Ambulatory Visit (INDEPENDENT_AMBULATORY_CARE_PROVIDER_SITE_OTHER): Payer: 59 | Admitting: Physician Assistant

## 2014-11-07 ENCOUNTER — Encounter: Payer: Self-pay | Admitting: Physician Assistant

## 2014-11-07 VITALS — BP 132/84 | HR 98 | Temp 98.4°F | Resp 16 | Wt 198.0 lb

## 2014-11-07 DIAGNOSIS — R399 Unspecified symptoms and signs involving the genitourinary system: Secondary | ICD-10-CM | POA: Insufficient documentation

## 2014-11-07 LAB — POCT URINALYSIS DIPSTICK
BILIRUBIN UA: NEGATIVE
Blood, UA: NEGATIVE
GLUCOSE UA: NEGATIVE
KETONES UA: NEGATIVE
NITRITE UA: NEGATIVE
PH UA: 5.5
Protein, UA: NEGATIVE
Spec Grav, UA: 1.025
Urobilinogen, UA: NEGATIVE

## 2014-11-07 MED ORDER — CIPROFLOXACIN HCL 500 MG PO TABS: 500.0000 mg | ORAL_TABLET | Freq: Two times a day (BID) | ORAL | Status: DC

## 2014-11-07 NOTE — Progress Notes (Signed)
Pre visit review using our clinic review tool, if applicable. No additional management support is needed unless otherwise documented below in the visit note. 

## 2014-11-07 NOTE — Progress Notes (Signed)
History of Present Illness: Shannon Hale is a 34 y.o. female who present to the clinic today complaining of urinary frequency, urinary urgency and dysuria x 1 day. Denis vaginal symptoms.  Shannon Hale denies fever, chills, nausea/vomiting and flank pain. Has been taking cranberry supplement which helps some.  History: Past Medical History  Diagnosis Date  . Abnormal Pap smear   . History of chicken pox   . Nausea   . Headache(784.0)     migraines  . PONV (postoperative nausea and vomiting)   . Hepatitis july 2005    unknown form of hepatitis, no current liver problems  . History of D&C     D&E    Current outpatient prescriptions:  .  clobetasol cream (TEMOVATE) 8.56 %, Apply 1 application topically 2 (two) times daily. (Shannon Hale taking differently: Apply 1 application topically 2 (two) times daily as needed. ), Disp: 60 g, Rfl: 2 .  ibuprofen (ADVIL,MOTRIN) 200 MG tablet, Take 800 mg by mouth every 6 (six) hours as needed for headache., Disp: , Rfl:  .  phentermine 37.5 MG capsule, Take 1 capsule (37.5 mg total) by mouth every morning., Disp: 30 capsule, Rfl: 0 .  SUMAtriptan (IMITREX) 100 MG tablet, Take 100 mg by mouth every 2 (two) hours as needed for migraine or headache (Take one tablet at start of headache then may repeat once 2 hours later as needed; Do not exceed 2 tablest per day.). May repeat in 2 hours if headache persists or recurs., Disp: , Rfl:  .  ciprofloxacin (CIPRO) 500 MG tablet, Take 1 tablet (500 mg total) by mouth 2 (two) times daily., Disp: 6 tablet, Rfl: 0 No Known Allergies Family History  Problem Relation Age of Onset  . Cancer Mother     breast, leaukemia-Living  . Heart disease Father     Living  . Seizures Brother     Resolved  . Diabetes Paternal Uncle   . Hypertension Maternal Grandmother   . Diabetes Maternal Grandmother   . Depression Maternal Grandmother     anxiety also  . Cancer Maternal Grandmother     breast  . Diabetes Paternal Grandmother   .  Heart disease Paternal Grandfather   . Cancer Paternal Grandfather     colon  . Cancer Maternal Aunt     cervical  . Healthy Brother     x2  . Healthy Son     x1   History   Social History  . Marital Status: Married    Spouse Name: N/A  . Number of Children: N/A  . Years of Education: N/A   Social History Main Topics  . Smoking status: Former Smoker -- 0.25 packs/day for .5 years    Types: Cigarettes    Quit date: 10/11/2002  . Smokeless tobacco: Never Used  . Alcohol Use: No  . Drug Use: No  . Sexual Activity: Yes   Other Topics Concern  . None   Social History Narrative    Review of Systems: See HPI.  All other ROS are negative.  Physical Examination: BP 132/84 mmHg  Pulse 98  Temp(Src) 98.4 F (36.9 C) (Oral)  Resp 16  Wt 198 lb (89.812 kg)  SpO2 98%  LMP 10/21/2014  General appearance: alert, cooperative, appears stated age and no distress Head: Normocephalic, without obvious abnormality, atraumatic Lungs: clear to auscultation bilaterally Heart: regular rate and rhythm, S1, S2 normal, no murmur, click, rub or gallop Abdomen: soft, non-tender; bowel sounds normal; no masses,  no organomegaly  and no CVA tenderness.  Labs/Diagnostics: Urinalysis    Component Value Date/Time   BILIRUBINUR neg 11/07/2014 1648   PROTEINUR neg 11/07/2014 1648   UROBILINOGEN negative 11/07/2014 1648   NITRITE neg 11/07/2014 1648   LEUKOCYTESUR small (1+) 11/07/2014 1648    Assessment/Plan: UTI symptoms Rx Cipro 500 mg BID x 3 days.  Urine sent for culture.  Supportive measures discussed.  Will change therapy if indicated by culture.

## 2014-11-07 NOTE — Assessment & Plan Note (Signed)
Rx Cipro 500 mg BID x 3 days.  Urine sent for culture.  Supportive measures discussed.  Will change therapy if indicated by culture.

## 2014-11-09 LAB — CULTURE, URINE COMPREHENSIVE
COLONY COUNT: NO GROWTH
ORGANISM ID, BACTERIA: NO GROWTH

## 2015-02-21 ENCOUNTER — Ambulatory Visit (INDEPENDENT_AMBULATORY_CARE_PROVIDER_SITE_OTHER): Payer: 59 | Admitting: Physician Assistant

## 2015-02-21 ENCOUNTER — Encounter: Payer: Self-pay | Admitting: Physician Assistant

## 2015-02-21 VITALS — BP 115/66 | HR 83 | Temp 98.5°F | Ht 69.0 in | Wt 188.4 lb

## 2015-02-21 DIAGNOSIS — B9689 Other specified bacterial agents as the cause of diseases classified elsewhere: Secondary | ICD-10-CM

## 2015-02-21 DIAGNOSIS — J019 Acute sinusitis, unspecified: Secondary | ICD-10-CM

## 2015-02-21 MED ORDER — AMOXICILLIN-POT CLAVULANATE 875-125 MG PO TABS: 1.0000 | ORAL_TABLET | Freq: Two times a day (BID) | ORAL | Status: DC

## 2015-02-21 MED ORDER — SUMATRIPTAN SUCCINATE 100 MG PO TABS: 100.0000 mg | ORAL_TABLET | Freq: Once | ORAL | Status: DC

## 2015-02-21 NOTE — Progress Notes (Signed)
Patient presents to clinic today c/o sinus pressure, sinus pain, chest congestion and cough x 1.5 weeks.  Patient endorses fatigue and sinus headache.  Denies chest pain, SOB or recent travel.  Has been taking Sudafed and Mucinex with mild relief of symptoms.  Past Medical History  Diagnosis Date  . Abnormal Pap smear   . History of chicken pox   . Nausea   . Headache(784.0)     migraines  . PONV (postoperative nausea and vomiting)   . Hepatitis july 2005    unknown form of hepatitis, no current liver problems  . History of D&C     D&E    Current Outpatient Prescriptions on File Prior to Visit  Medication Sig Dispense Refill  . ibuprofen (ADVIL,MOTRIN) 200 MG tablet Take 800 mg by mouth every 6 (six) hours as needed for headache.     No current facility-administered medications on file prior to visit.    No Known Allergies  Family History  Problem Relation Age of Onset  . Cancer Mother     breast, leaukemia-Living  . Heart disease Father     Living  . Seizures Brother     Resolved  . Diabetes Paternal Uncle   . Hypertension Maternal Grandmother   . Diabetes Maternal Grandmother   . Depression Maternal Grandmother     anxiety also  . Cancer Maternal Grandmother     breast  . Diabetes Paternal Grandmother   . Heart disease Paternal Grandfather   . Cancer Paternal Grandfather     colon  . Cancer Maternal Aunt     cervical  . Healthy Brother     x2  . Healthy Son     x1    History   Social History  . Marital Status: Married    Spouse Name: N/A  . Number of Children: N/A  . Years of Education: N/A   Social History Main Topics  . Smoking status: Former Smoker -- 0.25 packs/day for .5 years    Types: Cigarettes    Quit date: 10/11/2002  . Smokeless tobacco: Never Used  . Alcohol Use: No  . Drug Use: No  . Sexual Activity: Yes   Other Topics Concern  . None   Social History Narrative   Review of Systems - See HPI.  All other ROS are  negative.  BP 115/66 mmHg  Pulse 83  Temp(Src) 98.5 F (36.9 C) (Oral)  Ht 5\' 9"  (1.753 m)  Wt 188 lb 6.4 oz (85.458 kg)  BMI 27.81 kg/m2  SpO2 98%  LMP 02/02/2015  Physical Exam  Constitutional: She is oriented to person, place, and time and well-developed, well-nourished, and in no distress.  HENT:  Head: Normocephalic and atraumatic.  Right Ear: External ear normal.  Left Ear: External ear normal.  Nose: Right sinus exhibits frontal sinus tenderness. Left sinus exhibits frontal sinus tenderness.  Mouth/Throat: Uvula is midline, oropharynx is clear and moist and mucous membranes are normal. No oropharyngeal exudate.  Eyes: Conjunctivae are normal.  Neck: Neck supple.  Cardiovascular: Normal rate, regular rhythm, normal heart sounds and intact distal pulses.   Pulmonary/Chest: Effort normal and breath sounds normal. No respiratory distress. She has no wheezes. She has no rales. She exhibits no tenderness.  Neurological: She is alert and oriented to person, place, and time.  Skin: Skin is warm and dry. No rash noted.  Psychiatric: Affect normal.  Vitals reviewed.  Assessment/Plan: Acute bacterial sinusitis Rx Augmentin.  Increase fluids.  Rest.  Saline nasal spray.  Probiotic.  Mucinex as directed.  Humidifier in bedroom. Delsym for cough.  Call or return to clinic if symptoms are not improving.

## 2015-02-21 NOTE — Patient Instructions (Signed)
Please take antibiotic as directed.  Increase fluid intake.  Use Saline nasal spray.  Take a daily multivitamin. Use Delsym for cough.  Place a humidifier in the bedroom.  Please call or return clinic if symptoms are not improving.  Sinusitis Sinusitis is redness, soreness, and swelling (inflammation) of the paranasal sinuses. Paranasal sinuses are air pockets within the bones of your face (beneath the eyes, the middle of the forehead, or above the eyes). In healthy paranasal sinuses, mucus is able to drain out, and air is able to circulate through them by way of your nose. However, when your paranasal sinuses are inflamed, mucus and air can become trapped. This can allow bacteria and other germs to grow and cause infection. Sinusitis can develop quickly and last only a short time (acute) or continue over a long period (chronic). Sinusitis that lasts for more than 12 weeks is considered chronic.  CAUSES  Causes of sinusitis include:  Allergies.  Structural abnormalities, such as displacement of the cartilage that separates your nostrils (deviated septum), which can decrease the air flow through your nose and sinuses and affect sinus drainage.  Functional abnormalities, such as when the small hairs (cilia) that line your sinuses and help remove mucus do not work properly or are not present. SYMPTOMS  Symptoms of acute and chronic sinusitis are the same. The primary symptoms are pain and pressure around the affected sinuses. Other symptoms include:  Upper toothache.  Earache.  Headache.  Bad breath.  Decreased sense of smell and taste.  A cough, which worsens when you are lying flat.  Fatigue.  Fever.  Thick drainage from your nose, which often is green and may contain pus (purulent).  Swelling and warmth over the affected sinuses. DIAGNOSIS  Your caregiver will perform a physical exam. During the exam, your caregiver may:  Look in your nose for signs of abnormal growths in your  nostrils (nasal polyps).  Tap over the affected sinus to check for signs of infection.  View the inside of your sinuses (endoscopy) with a special imaging device with a light attached (endoscope), which is inserted into your sinuses. If your caregiver suspects that you have chronic sinusitis, one or more of the following tests may be recommended:  Allergy tests.  Nasal culture A sample of mucus is taken from your nose and sent to a lab and screened for bacteria.  Nasal cytology A sample of mucus is taken from your nose and examined by your caregiver to determine if your sinusitis is related to an allergy. TREATMENT  Most cases of acute sinusitis are related to a viral infection and will resolve on their own within 10 days. Sometimes medicines are prescribed to help relieve symptoms (pain medicine, decongestants, nasal steroid sprays, or saline sprays).  However, for sinusitis related to a bacterial infection, your caregiver will prescribe antibiotic medicines. These are medicines that will help kill the bacteria causing the infection.  Rarely, sinusitis is caused by a fungal infection. In theses cases, your caregiver will prescribe antifungal medicine. For some cases of chronic sinusitis, surgery is needed. Generally, these are cases in which sinusitis recurs more than 3 times per year, despite other treatments. HOME CARE INSTRUCTIONS   Drink plenty of water. Water helps thin the mucus so your sinuses can drain more easily.  Use a humidifier.  Inhale steam 3 to 4 times a day (for example, sit in the bathroom with the shower running).  Apply a warm, moist washcloth to your face 3 to  4 times a day, or as directed by your caregiver.  Use saline nasal sprays to help moisten and clean your sinuses.  Take over-the-counter or prescription medicines for pain, discomfort, or fever only as directed by your caregiver. SEEK IMMEDIATE MEDICAL CARE IF:  You have increasing pain or severe  headaches.  You have nausea, vomiting, or drowsiness.  You have swelling around your face.  You have vision problems.  You have a stiff neck.  You have difficulty breathing. MAKE SURE YOU:   Understand these instructions.  Will watch your condition.  Will get help right away if you are not doing well or get worse. Document Released: 08/19/2005 Document Revised: 11/11/2011 Document Reviewed: 09/03/2011 [facility] Patient Information 2014 [place], [place].

## 2015-02-21 NOTE — Progress Notes (Signed)
Pre visit review using our clinic review tool, if applicable. No additional management support is needed unless otherwise documented below in the visit note. 

## 2015-02-21 NOTE — Assessment & Plan Note (Signed)
Rx Augmentin.  Increase fluids.  Rest.  Saline nasal spray.  Probiotic.  Mucinex as directed.  Humidifier in bedroom. Delsym for cough.  Call or return to clinic if symptoms are not improving.

## 2015-07-20 DIAGNOSIS — M24242 Disorder of ligament, left hand: Secondary | ICD-10-CM | POA: Insufficient documentation

## 2015-09-03 NOTE — L&D Delivery Note (Signed)
Delivery Note  SVD viable female Apgars 9,9 over 2nd degree ML lac.  Placenta delivered spontaneously intact with 3VC. Repair with 2-0 Chromic with good support and hemostasis noted and R/V exam confirms.  PH art was sent.  [place] cord blood was not done.  Mother and baby were doing well.  EBL 200cc  Louretta Shorten, MD

## 2015-10-11 DIAGNOSIS — N926 Irregular menstruation, unspecified: Secondary | ICD-10-CM | POA: Diagnosis not present

## 2015-10-13 DIAGNOSIS — N912 Amenorrhea, unspecified: Secondary | ICD-10-CM | POA: Diagnosis not present

## 2015-11-15 DIAGNOSIS — N911 Secondary amenorrhea: Secondary | ICD-10-CM | POA: Diagnosis not present

## 2015-11-28 DIAGNOSIS — Z3491 Encounter for supervision of normal pregnancy, unspecified, first trimester: Secondary | ICD-10-CM | POA: Diagnosis not present

## 2015-11-28 DIAGNOSIS — Z36 Encounter for antenatal screening of mother: Secondary | ICD-10-CM | POA: Diagnosis not present

## 2015-11-28 DIAGNOSIS — Z3A11 11 weeks gestation of pregnancy: Secondary | ICD-10-CM | POA: Diagnosis not present

## 2015-11-28 LAB — OB RESULTS CONSOLE GC/CHLAMYDIA
CHLAMYDIA, DNA PROBE: NEGATIVE
GC PROBE AMP, GENITAL: NEGATIVE

## 2015-11-28 LAB — OB RESULTS CONSOLE ANTIBODY SCREEN: ANTIBODY SCREEN: NEGATIVE

## 2015-11-28 LAB — OB RESULTS CONSOLE HIV ANTIBODY (ROUTINE TESTING): HIV: NONREACTIVE

## 2015-11-28 LAB — OB RESULTS CONSOLE ABO/RH: RH Type: POSITIVE

## 2015-11-28 LAB — OB RESULTS CONSOLE RPR: RPR: NONREACTIVE

## 2015-11-28 LAB — OB RESULTS CONSOLE RUBELLA ANTIBODY, IGM: Rubella: UNDETERMINED

## 2015-11-28 LAB — OB RESULTS CONSOLE HEPATITIS B SURFACE ANTIGEN: Hepatitis B Surface Ag: NEGATIVE

## 2015-12-05 DIAGNOSIS — Z3491 Encounter for supervision of normal pregnancy, unspecified, first trimester: Secondary | ICD-10-CM | POA: Diagnosis not present

## 2015-12-05 DIAGNOSIS — Z36 Encounter for antenatal screening of mother: Secondary | ICD-10-CM | POA: Diagnosis not present

## 2015-12-06 MED FILL — BUTALB-ACETAMIN-CAFF 50-300: 50-300-40 | 3 days supply | Qty: 30 | Fill #0

## 2016-01-01 MED FILL — AZITHROMYCIN 250 MG TABLET: 250 | 4 days supply | Qty: 6 | Fill #0

## 2016-01-23 DIAGNOSIS — Z36 Encounter for antenatal screening of mother: Secondary | ICD-10-CM | POA: Diagnosis not present

## 2016-01-23 DIAGNOSIS — Z3400 Encounter for supervision of normal first pregnancy, unspecified trimester: Secondary | ICD-10-CM | POA: Diagnosis not present

## 2016-03-20 DIAGNOSIS — D509 Iron deficiency anemia, unspecified: Secondary | ICD-10-CM | POA: Diagnosis not present

## 2016-03-20 DIAGNOSIS — Z3400 Encounter for supervision of normal first pregnancy, unspecified trimester: Secondary | ICD-10-CM | POA: Diagnosis not present

## 2016-03-20 DIAGNOSIS — Z36 Encounter for antenatal screening of mother: Secondary | ICD-10-CM | POA: Diagnosis not present

## 2016-03-20 DIAGNOSIS — Z3A27 27 weeks gestation of pregnancy: Secondary | ICD-10-CM | POA: Diagnosis not present

## 2016-03-20 DIAGNOSIS — Z23 Encounter for immunization: Secondary | ICD-10-CM | POA: Diagnosis not present

## 2016-03-21 MED FILL — BUTALB-ACETAMIN-CAFF 50-325: 50-325-40 | 3 days supply | Qty: 20 | Fill #0

## 2016-05-08 DIAGNOSIS — Z3493 Encounter for supervision of normal pregnancy, unspecified, third trimester: Secondary | ICD-10-CM | POA: Diagnosis not present

## 2016-05-08 DIAGNOSIS — D509 Iron deficiency anemia, unspecified: Secondary | ICD-10-CM | POA: Diagnosis not present

## 2016-05-14 DIAGNOSIS — Z36 Encounter for antenatal screening of mother: Secondary | ICD-10-CM | POA: Diagnosis not present

## 2016-05-22 ENCOUNTER — Emergency Department (INDEPENDENT_AMBULATORY_CARE_PROVIDER_SITE_OTHER)
Admission: EM | Admit: 2016-05-22 | Discharge: 2016-05-22 | Disposition: A | Discharge status: Home / Self Care | Payer: Worker's Compensation | Source: Home / Self Care | Attending: Family Medicine | Admitting: Family Medicine

## 2016-05-22 ENCOUNTER — Emergency Department (INDEPENDENT_AMBULATORY_CARE_PROVIDER_SITE_OTHER): Payer: Worker's Compensation

## 2016-05-22 ENCOUNTER — Encounter: Payer: Self-pay | Admitting: Emergency Medicine

## 2016-05-22 DIAGNOSIS — M25571 Pain in right ankle and joints of right foot: Secondary | ICD-10-CM

## 2016-05-22 DIAGNOSIS — S93401A Sprain of unspecified ligament of right ankle, initial encounter: Secondary | ICD-10-CM

## 2016-05-22 DIAGNOSIS — M7989 Other specified soft tissue disorders: Secondary | ICD-10-CM | POA: Diagnosis not present

## 2016-05-22 NOTE — Discharge Instructions (Signed)
Apply ice pack for 30 minutes every 1 to 2 hours today and tomorrow.  Elevate.  Use crutches for 3 to 5 days.  Wear Ace wrap until swelling decreases.  Wear brace for about 2 to 3 weeks.  Begin range of motion and stretching exercises in about 5 days as per instruction sheet.  May take Tylenol as needed for pain.

## 2016-05-22 NOTE — ED Provider Notes (Signed)
[facility]    CSN: [id number] Arrival date & time: 05/22/16  [id number]  First Provider Contact:  First MD Initiated Contact with Patient 05/22/16 [phone]        History   Chief Complaint Chief Complaint  Patient presents with  . Ankle Injury    HPI Shannon Hale is a 35 y.o. female.   Patient presents for occupational injury.  This morning while walking down steps, she missed the last step and twisted her right ankle.  She has had persistent pain/swelling. Patient is pregnant at [redacted] weeks.  No abdominal injury, pain, or vaginal bleeding.   The history is provided by the patient.  Ankle Pain  Location:  Ankle Time since incident:  1 hour Injury: yes   Mechanism of injury comment:  Inverted ankle Ankle location:  R ankle Pain details:    Quality:  Aching   Radiates to:  Does not radiate   Severity:  Moderate   Onset quality:  Sudden   Duration:  1 hour   Timing:  Constant   Progression:  Unchanged Chronicity:  New Dislocation: no   Prior injury to area:  No Relieved by:  None tried Worsened by:  Bearing weight Ineffective treatments:  None tried Associated symptoms: decreased ROM, stiffness and swelling   Associated symptoms: no back pain, no muscle weakness, no numbness and no tingling     Past Medical History:  Diagnosis Date  . Abnormal Pap smear   . Headache(784.0)    migraines  . Hepatitis july 2005   unknown form of hepatitis, no current liver problems  . History of chicken pox   . History of D&C    D&E  . Nausea   . PONV (postoperative nausea and vomiting)     Patient Active Problem List   Diagnosis Date Noted  . Acute bacterial sinusitis 02/21/2015  . UTI symptoms 11/07/2014  . Obesity 09/25/2014  . Viral URI with cough 05/25/2014  . Sinusitis 01/06/2014  . Dermatitis 12/31/2013  . Cholelithiasis 02/29/2012  . BACK PAIN, THORACIC REGION 11/19/2010  . APHTHOUS STOMATITIS 08/10/2009  . MIGRAINE HEADACHE 09/23/2008    Past  Surgical History:  Procedure Laterality Date  . CHOLECYSTECTOMY N/A 12/01/2013   Procedure: LAPAROSCOPIC CHOLECYSTECTOMY ;  Surgeon: Harl Bowie, MD;  Location: [facility];  Service: General;  Laterality: N/A;  . DILATION AND EVACUATION N/A 07/14/2014   Procedure: DILATATION AND EVACUATION;  Surgeon: Marylynn Pearson, MD;  Location: [facility];  Service: Gynecology;  Laterality: N/A;  . SEPTOPLASTY    . TONSILLECTOMY    . WISDOM TOOTH EXTRACTION      OB History    Gravida Para Term Preterm AB Living   3 1 1   1 1    SAB TAB Ectopic Multiple Live Births   1       1       Home Medications    Prior to Admission medications   Medication Sig Start Date End Date Taking? Authorizing Provider  Prenatal Vit-Fe Fumarate-FA (MULTIVITAMIN-PRENATAL) 27-0.8 MG TABS tablet Take 1 tablet by mouth daily at 12 noon.   Yes Historical Provider, MD  ibuprofen (ADVIL,MOTRIN) 200 MG tablet Take 800 mg by mouth every 6 (six) hours as needed for headache.    Historical Provider, MD  SUMAtriptan (IMITREX) 100 MG tablet Take 1 tablet (100 mg total) by mouth once. May repeat in 2 hours if headache persists or recurs. 02/21/15   Brunetta Jeans, PA-C  Family History Family History  Problem Relation Age of Onset  . Cancer Mother     breast, leaukemia-Living  . Heart disease Father     Living  . Hypertension Maternal Grandmother   . Diabetes Maternal Grandmother   . Depression Maternal Grandmother     anxiety also  . Cancer Maternal Grandmother     breast  . Diabetes Paternal Grandmother   . Heart disease Paternal Grandfather   . Cancer Paternal Grandfather     colon  . Cancer Maternal Aunt     cervical  . Seizures Brother     Resolved  . Diabetes Paternal Uncle   . Healthy Brother     x2  . Healthy Son     x1    Social History Social History  Substance Use Topics  . Smoking status: Former Smoker    Packs/day: 0.25    Years: 0.50    Types: Cigarettes    Quit date: 10/11/2002  . Smokeless  tobacco: Never Used  . Alcohol use No     Allergies   Review of patient's allergies indicates no known allergies.   Review of Systems Review of Systems  Musculoskeletal: Positive for stiffness. Negative for back pain.  All other systems reviewed and are negative.    Physical Exam Triage Vital Signs ED Triage Vitals  Enc Vitals Group     BP 05/22/16 0913 106/70     Pulse Rate 05/22/16 0913 93     Resp --      Temp 05/22/16 0913 97.6 F (36.4 C)     Temp Source 05/22/16 0913 Oral     SpO2 05/22/16 0913 96 %     Weight 05/22/16 0917 223 lb (101.2 kg)     Height 05/22/16 0917 5\' 9"  (1.753 m)     Head Circumference --      Peak Flow --      Pain Score 05/22/16 0922 5     Pain Loc --      Pain Edu? --      Excl. in [place]? --    No data found.   Updated Vital Signs BP 106/70 (BP Location: Left Arm)   Pulse 93   Temp 97.6 F (36.4 C) (Oral)   Ht 5\' 9"  (1.753 m)   Wt 223 lb (101.2 kg)   SpO2 96%   BMI 32.93 kg/m   Visual Acuity Right Eye Distance:   Left Eye Distance:   Bilateral Distance:    Right Eye Near:   Left Eye Near:    Bilateral Near:     Physical Exam  Constitutional: She appears well-developed and well-nourished. No distress.  HENT:  Head: Atraumatic.  Mouth/Throat: Oropharynx is clear and moist.  Eyes: Conjunctivae are normal. Pupils are equal, round, and reactive to light.  Neck: Normal range of motion.  Cardiovascular: Normal heart sounds.   Pulmonary/Chest: Breath sounds normal.  Abdominal: There is no tenderness.  Musculoskeletal: She exhibits no edema.       Right ankle: She exhibits decreased range of motion and swelling. She exhibits no ecchymosis, no deformity, no laceration and normal pulse. Tenderness. Lateral malleolus tenderness found. Achilles tendon normal.       Feet:   Right ankle:  Decreased range of motion.  Tenderness and swelling over the lateral malleolus.  Joint stable.  No tenderness over the base of the fifth metatarsal.   Distal neurovascular function is intact.   Neurological: She is alert.  Skin: Skin is  warm and dry.  Nursing note and vitals reviewed.    UC Treatments / Results  Labs (all labs ordered are listed, but only abnormal results are displayed) Labs Reviewed - No data to display  EKG  EKG Interpretation None       Radiology Dg Ankle Complete Right  Result Date: 05/22/2016 CLINICAL DATA:  Right lateral ankle pain and swelling after fall down stairs this morning. Initial encounter. EXAM: RIGHT ANKLE - COMPLETE 3+ VIEW COMPARISON:  None. FINDINGS: Mild lateral soft tissue swelling. There is no evidence of fracture, dislocation, or joint effusion. IMPRESSION: Negative. Electronically Signed   By: Monte Fantasia M.D.   On: 05/22/2016 09:11    Procedures Procedures (including critical care time)  Medications Ordered in UC Medications - No data to display   Initial Impression / Assessment and Plan / UC Course  I have reviewed the triage vital signs and the nursing notes.  Pertinent labs & imaging results that were available during my care of the patient were reviewed by me and considered in my medical decision making (see chart for details).  Clinical Course  Applied ace wrap and AirCast stirrup splint Dispensed crutches. Apply ice pack for 30 minutes every 1 to 2 hours today and tomorrow.  Elevate.  Use crutches for 3 to 5 days.  Wear Ace wrap until swelling decreases.  Wear brace for about 2 to 3 weeks.  Begin range of motion and stretching exercises in about 5 days as per instruction sheet.  May take Tylenol as needed for pain. Followup with [facility] in 5 days.  Work restrictions:  Out of work today.  Wear AirCast splint.  Use crutches for 5 days.     Final Clinical Impressions(s) / UC Diagnoses   Final diagnoses:  Right ankle sprain, initial encounter    New Prescriptions New Prescriptions   No medications on file     Kandra Nicolas, MD 05/30/16  [id number]

## 2016-05-22 NOTE — ED Triage Notes (Signed)
Patient missed last step this morning and fell twisting Right ankle, swollen and bruised, 5/10

## 2016-06-04 ENCOUNTER — Other Ambulatory Visit: Payer: Self-pay | Admitting: Adult Health

## 2016-06-04 ENCOUNTER — Ambulatory Visit (INDEPENDENT_AMBULATORY_CARE_PROVIDER_SITE_OTHER): Payer: Worker's Compensation

## 2016-06-04 DIAGNOSIS — R52 Pain, unspecified: Secondary | ICD-10-CM

## 2016-06-04 DIAGNOSIS — M79671 Pain in right foot: Secondary | ICD-10-CM

## 2016-06-06 ENCOUNTER — Telehealth (HOSPITAL_COMMUNITY): Payer: Self-pay | Admitting: *Deleted

## 2016-06-06 ENCOUNTER — Encounter (HOSPITAL_COMMUNITY): Payer: Self-pay | Admitting: *Deleted

## 2016-06-06 NOTE — Telephone Encounter (Signed)
Preadmission screen  

## 2016-06-14 ENCOUNTER — Inpatient Hospital Stay (HOSPITAL_COMMUNITY): Payer: 59 | Admitting: Anesthesiology

## 2016-06-14 ENCOUNTER — Encounter (HOSPITAL_COMMUNITY): Payer: Self-pay

## 2016-06-14 ENCOUNTER — Inpatient Hospital Stay (HOSPITAL_COMMUNITY)
Admission: RE | Admit: 2016-06-14 | Discharge: 2016-06-15 | DRG: 775 | Disposition: A | Discharge status: Home / Self Care | Payer: 59 | Source: Ambulatory Visit | Attending: Obstetrics and Gynecology | Admitting: Obstetrics and Gynecology

## 2016-06-14 DIAGNOSIS — Z87891 Personal history of nicotine dependence: Secondary | ICD-10-CM

## 2016-06-14 DIAGNOSIS — Z3A40 40 weeks gestation of pregnancy: Secondary | ICD-10-CM

## 2016-06-14 DIAGNOSIS — O480 Post-term pregnancy: Secondary | ICD-10-CM | POA: Diagnosis present

## 2016-06-14 DIAGNOSIS — Z833 Family history of diabetes mellitus: Secondary | ICD-10-CM

## 2016-06-14 DIAGNOSIS — Z8249 Family history of ischemic heart disease and other diseases of the circulatory system: Secondary | ICD-10-CM

## 2016-06-14 DIAGNOSIS — Z9889 Other specified postprocedural states: Secondary | ICD-10-CM | POA: Diagnosis present

## 2016-06-14 LAB — CBC
HCT: 32.2 % — ABNORMAL LOW (ref 36.0–46.0)
HEMOGLOBIN: 11.1 g/dL — AB (ref 12.0–15.0)
MCH: 28.4 pg (ref 26.0–34.0)
MCHC: 34.5 g/dL (ref 30.0–36.0)
MCV: 82.4 fL (ref 78.0–100.0)
Platelets: 257 10*3/uL (ref 150–400)
RBC: 3.91 MIL/uL (ref 3.87–5.11)
RDW: 13.6 % (ref 11.5–15.5)
WBC: 10 10*3/uL (ref 4.0–10.5)

## 2016-06-14 LAB — RPR: RPR: NONREACTIVE

## 2016-06-14 LAB — TYPE AND SCREEN
ABO/RH(D): A POS
ANTIBODY SCREEN: NEGATIVE

## 2016-06-14 LAB — OB RESULTS CONSOLE GBS: STREP GROUP B AG: NEGATIVE

## 2016-06-14 MED ORDER — PHENYLEPHRINE 40 MCG/ML (10ML) SYRINGE FOR IV PUSH (FOR BLOOD PRESSURE SUPPORT): 80.0000 ug | PREFILLED_SYRINGE | INTRAVENOUS | Status: DC | PRN

## 2016-06-14 MED ORDER — COCONUT OIL OIL: 1.0000 "application " | TOPICAL_OIL | Status: DC | PRN

## 2016-06-14 MED ORDER — OXYCODONE-ACETAMINOPHEN 5-325 MG PO TABS
1.0000 | ORAL_TABLET | ORAL | Status: DC | PRN
  Administered 2016-06-14: 1 via ORAL
  Filled 2016-06-14: qty 1

## 2016-06-14 MED ORDER — BENZOCAINE-MENTHOL 20-0.5 % EX AERO: 1.0000 "application " | INHALATION_SPRAY | CUTANEOUS | Status: DC | PRN

## 2016-06-14 MED ORDER — DIBUCAINE 1 % RE OINT: 1.0000 "application " | TOPICAL_OINTMENT | RECTAL | Status: DC | PRN

## 2016-06-14 MED ORDER — EPHEDRINE 5 MG/ML INJ: 10.0000 mg | INTRAVENOUS | Status: DC | PRN

## 2016-06-14 MED ORDER — ZOLPIDEM TARTRATE 5 MG PO TABS: 5.0000 mg | ORAL_TABLET | Freq: Every evening | ORAL | Status: DC | PRN

## 2016-06-14 MED ORDER — DIPHENHYDRAMINE HCL 50 MG/ML IJ SOLN: 12.5000 mg | INTRAMUSCULAR | Status: DC | PRN

## 2016-06-14 MED ORDER — PROMETHAZINE HCL 25 MG/ML IJ SOLN: 25.0000 mg | Freq: Four times a day (QID) | INTRAMUSCULAR | Status: DC | PRN

## 2016-06-14 MED ORDER — WITCH HAZEL-GLYCERIN EX PADS: 1.0000 "application " | MEDICATED_PAD | CUTANEOUS | Status: DC | PRN

## 2016-06-14 MED ORDER — OXYTOCIN 40 UNITS IN LACTATED RINGERS INFUSION - SIMPLE MED: 2.5000 [IU]/h | INTRAVENOUS | Status: DC

## 2016-06-14 MED ORDER — OXYTOCIN BOLUS FROM INFUSION
500.0000 mL | Freq: Once | INTRAVENOUS | Status: AC
  Administered 2016-06-14: 62.5 mL/h via INTRAVENOUS

## 2016-06-14 MED ORDER — MISOPROSTOL 50MCG HALF TABLET
50.0000 ug | ORAL_TABLET | ORAL | Status: DC
  Administered 2016-06-14 (×2): 50 ug via ORAL
  Filled 2016-06-14 (×2): qty 0.5

## 2016-06-14 MED ORDER — OXYCODONE-ACETAMINOPHEN 5-325 MG PO TABS
1.0000 | ORAL_TABLET | ORAL | Status: DC | PRN
Start: 2016-06-14 — End: 2016-06-14

## 2016-06-14 MED ORDER — LACTATED RINGERS IV SOLN: 500.0000 mL | Freq: Once | INTRAVENOUS | Status: DC

## 2016-06-14 MED ORDER — ONDANSETRON HCL 4 MG PO TABS: 4.0000 mg | ORAL_TABLET | ORAL | Status: DC | PRN

## 2016-06-14 MED ORDER — LIDOCAINE HCL (PF) 1 % IJ SOLN
INTRAMUSCULAR | Status: DC | PRN
  Administered 2016-06-14: 8 mL via EPIDURAL
  Administered 2016-06-14: 5 mL via EPIDURAL

## 2016-06-14 MED ORDER — TERBUTALINE SULFATE 1 MG/ML IJ SOLN: 0.2500 mg | Freq: Once | INTRAMUSCULAR | Status: DC | PRN

## 2016-06-14 MED ORDER — FENTANYL 2.5 MCG/ML BUPIVACAINE 1/10 % EPIDURAL INFUSION (WH - ANES)
14.0000 mL/h | INTRAMUSCULAR | Status: DC | PRN
  Filled 2016-06-14: qty 125

## 2016-06-14 MED ORDER — MEASLES, MUMPS & RUBELLA VAC ~~LOC~~ INJ
0.5000 mL | INJECTION | Freq: Once | SUBCUTANEOUS | Status: AC
  Administered 2016-06-15: 0.5 mL via SUBCUTANEOUS
  Filled 2016-06-14: qty 0.5

## 2016-06-14 MED ORDER — ONDANSETRON HCL 4 MG/2ML IJ SOLN: 4.0000 mg | INTRAMUSCULAR | Status: DC | PRN

## 2016-06-14 MED ORDER — LACTATED RINGERS IV SOLN
500.0000 mL | INTRAVENOUS | Status: DC | PRN
  Administered 2016-06-14 (×2): 500 mL via INTRAVENOUS

## 2016-06-14 MED ORDER — FLEET ENEMA 7-19 GM/118ML RE ENEM: 1.0000 | ENEMA | RECTAL | Status: DC | PRN

## 2016-06-14 MED ORDER — DIPHENHYDRAMINE HCL 25 MG PO CAPS: 25.0000 mg | ORAL_CAPSULE | Freq: Four times a day (QID) | ORAL | Status: DC | PRN

## 2016-06-14 MED ORDER — ONDANSETRON HCL 4 MG/2ML IJ SOLN
4.0000 mg | Freq: Four times a day (QID) | INTRAMUSCULAR | Status: DC | PRN
  Administered 2016-06-14: 4 mg via INTRAVENOUS
  Filled 2016-06-14: qty 2

## 2016-06-14 MED ORDER — ACETAMINOPHEN 325 MG PO TABS: 650.0000 mg | ORAL_TABLET | ORAL | Status: DC | PRN

## 2016-06-14 MED ORDER — OXYCODONE-ACETAMINOPHEN 5-325 MG PO TABS
2.0000 | ORAL_TABLET | ORAL | Status: DC | PRN
Start: 2016-06-14 — End: 2016-06-14

## 2016-06-14 MED ORDER — ZOLPIDEM TARTRATE 5 MG PO TABS
5.0000 mg | ORAL_TABLET | Freq: Every evening | ORAL | Status: DC | PRN
  Administered 2016-06-14: 5 mg via ORAL
  Filled 2016-06-14: qty 1

## 2016-06-14 MED ORDER — OXYTOCIN 40 UNITS IN LACTATED RINGERS INFUSION - SIMPLE MED
1.0000 m[IU]/min | INTRAVENOUS | Status: DC
  Filled 2016-06-14: qty 1000

## 2016-06-14 MED ORDER — OXYCODONE-ACETAMINOPHEN 5-325 MG PO TABS: 2.0000 | ORAL_TABLET | ORAL | Status: DC | PRN

## 2016-06-14 MED ORDER — LACTATED RINGERS IV SOLN
INTRAVENOUS | Status: DC
  Administered 2016-06-14: 02:00:00 via INTRAVENOUS

## 2016-06-14 MED ORDER — SENNOSIDES-DOCUSATE SODIUM 8.6-50 MG PO TABS
2.0000 | ORAL_TABLET | ORAL | Status: DC
  Administered 2016-06-14: 2 via ORAL
  Filled 2016-06-14: qty 2

## 2016-06-14 MED ORDER — BUTORPHANOL TARTRATE 1 MG/ML IJ SOLN: 2.0000 mg | INTRAMUSCULAR | Status: DC | PRN

## 2016-06-14 MED ORDER — FENTANYL 2.5 MCG/ML BUPIVACAINE 1/10 % EPIDURAL INFUSION (WH - ANES)
14.0000 mL/h | INTRAMUSCULAR | Status: DC | PRN
  Administered 2016-06-14: 14 mL/h via EPIDURAL

## 2016-06-14 MED ORDER — PHENYLEPHRINE 40 MCG/ML (10ML) SYRINGE FOR IV PUSH (FOR BLOOD PRESSURE SUPPORT)
80.0000 ug | PREFILLED_SYRINGE | INTRAVENOUS | Status: DC | PRN
  Filled 2016-06-14: qty 10

## 2016-06-14 MED ORDER — LIDOCAINE HCL (PF) 1 % IJ SOLN: 30.0000 mL | INTRAMUSCULAR | Status: DC | PRN

## 2016-06-14 MED ORDER — TETANUS-DIPHTH-ACELL PERTUSSIS 5-2.5-18.5 LF-MCG/0.5 IM SUSP: 0.5000 mL | Freq: Once | INTRAMUSCULAR | Status: DC

## 2016-06-14 MED ORDER — SOD CITRATE-CITRIC ACID 500-334 MG/5ML PO SOLN: 30.0000 mL | ORAL | Status: DC | PRN

## 2016-06-14 MED ORDER — MEDROXYPROGESTERONE ACETATE 150 MG/ML IM SUSP: 150.0000 mg | INTRAMUSCULAR | Status: DC | PRN

## 2016-06-14 MED ORDER — PRENATAL MULTIVITAMIN CH
1.0000 | ORAL_TABLET | Freq: Every day | ORAL | Status: DC
  Administered 2016-06-15: 1 via ORAL
  Filled 2016-06-14: qty 1

## 2016-06-14 MED ORDER — BUTORPHANOL TARTRATE 1 MG/ML IJ SOLN
1.0000 mg | INTRAMUSCULAR | Status: DC | PRN
  Administered 2016-06-14: 1 mg via INTRAVENOUS
  Filled 2016-06-14: qty 1

## 2016-06-14 MED ORDER — IBUPROFEN 600 MG PO TABS
600.0000 mg | ORAL_TABLET | Freq: Four times a day (QID) | ORAL | Status: DC
  Administered 2016-06-14 – 2016-06-15 (×3): 600 mg via ORAL
  Filled 2016-06-14 (×4): qty 1

## 2016-06-14 MED ORDER — SIMETHICONE 80 MG PO CHEW: 80.0000 mg | CHEWABLE_TABLET | ORAL | Status: DC | PRN

## 2016-06-14 NOTE — Anesthesia Postprocedure Evaluation (Signed)
Anesthesia Post Note  Patient: EHRIN SHAHEED  Procedure(s) Performed: * No procedures listed *  Patient location during evaluation: Mother Baby Anesthesia Type: Epidural Level of consciousness: awake Pain management: pain level controlled Vital Signs Assessment: post-procedure vital signs reviewed and stable Respiratory status: spontaneous breathing Cardiovascular status: stable Postop Assessment: no headache, no backache, epidural receding, patient able to bend at knees, no signs of nausea or vomiting and adequate PO intake Anesthetic complications: no     Last Vitals:  Vitals:   06/14/16 1545 06/14/16 1645  BP: 118/64 122/69  Pulse: 65 66  Resp: 18 18  Temp: 36.8 C 37.2 C    Last Pain:  Vitals:   06/14/16 1645  TempSrc: Oral  PainSc: 0-No pain   Pain Goal:                 Rithvik Orcutt

## 2016-06-14 NOTE — Anesthesia Preprocedure Evaluation (Signed)
Anesthesia Evaluation  Patient identified by MRN, date of birth, ID band Patient awake    Reviewed: Allergy & Precautions, H&P , NPO status , Patient's Chart, lab work & pertinent test results  Airway Mallampati: I  TM Distance: >3 FB Neck ROM: full    Dental no notable dental hx.    Pulmonary neg pulmonary ROS, former smoker,    Pulmonary exam normal        Cardiovascular negative cardio ROS Normal cardiovascular exam     Neuro/Psych negative psych ROS   GI/Hepatic negative GI ROS,   Endo/Other  negative endocrine ROS  Renal/GU negative Renal ROS     Musculoskeletal   Abdominal (+) + obese,   Peds  Hematology negative hematology ROS (+)   Anesthesia Other Findings   Reproductive/Obstetrics (+) Pregnancy                             Anesthesia Physical Anesthesia Plan  ASA: II  Anesthesia Plan: Epidural   Post-op Pain Management:    Induction:   Airway Management Planned:   Additional Equipment:   Intra-op Plan:   Post-operative Plan:   Informed Consent: I have reviewed the patients History and Physical, chart, labs and discussed the procedure including the risks, benefits and alternatives for the proposed anesthesia with the patient or authorized representative who has indicated his/her understanding and acceptance.     Plan Discussed with:   Anesthesia Plan Comments:         Anesthesia Quick Evaluation

## 2016-06-14 NOTE — Anesthesia Procedure Notes (Signed)
Epidural Patient location during procedure: OB Start time: 06/14/2016 8:57 AM End time: 06/14/2016 9:01 AM  Staffing Anesthesiologist: Lyn Hollingshead Performed: anesthesiologist   Preanesthetic Checklist Completed: patient identified, surgical consent, pre-op evaluation, timeout performed, IV checked, risks and benefits discussed and monitors and equipment checked  Epidural Patient position: sitting Prep: site prepped and draped and DuraPrep Patient monitoring: continuous pulse ox and blood pressure Approach: midline Location: L3-L4 Injection technique: LOR air  Needle:  Needle type: Tuohy  Needle gauge: 17 G Needle length: 9 cm and 9 Needle insertion depth: 5 cm cm Catheter type: closed end flexible Catheter size: 19 Gauge Catheter at skin depth: 10 cm Test dose: negative and Other  Assessment Sensory level: T9 Events: blood not aspirated, injection not painful, no injection resistance, negative IV test and no paresthesia  Additional Notes Reason for block:procedure for pain

## 2016-06-14 NOTE — H&P (Signed)
Shannon Hale is a 35 y.o. female presenting for IOL due to postdates.  Pregnancy uncomplicated.  GBS-. OB History    Gravida Para Term Preterm AB Living   4 1 1   2 1    SAB TAB Ectopic Multiple Live Births   2       1     Past Medical History:  Diagnosis Date  . Abnormal Pap smear   . Headache(784.0)    migraines  . Hepatitis july 2005   unknown form of hepatitis, no current liver problems  . History of chicken pox   . History of D&C    D&E  . Nausea   . PONV (postoperative nausea and vomiting)   . Vaginal Pap smear, abnormal    Past Surgical History:  Procedure Laterality Date  . CHOLECYSTECTOMY N/A 12/01/2013   Procedure: LAPAROSCOPIC CHOLECYSTECTOMY ;  Surgeon: Harl Bowie, MD;  Location: [facility];  Service: General;  Laterality: N/A;  . DILATION AND EVACUATION N/A 07/14/2014   Procedure: DILATATION AND EVACUATION;  Surgeon: Marylynn Pearson, MD;  Location: [facility];  Service: Gynecology;  Laterality: N/A;  . SEPTOPLASTY    . TONSILLECTOMY    . WISDOM TOOTH EXTRACTION     Family History: family history includes Angina in her father; Cancer in her maternal aunt, maternal grandmother, mother, and paternal grandfather; Depression in her maternal grandmother; Diabetes in her maternal grandmother, paternal grandmother, and paternal uncle; Healthy in her brother and son; Heart disease in her paternal grandfather; Hypertension in her maternal grandmother; Seizures in her brother. Social History:  reports that she quit smoking about 13 years ago. Her smoking use included Cigarettes. She has a 0.12 pack-year smoking history. She has never used smokeless tobacco. She reports that she does not drink alcohol or use drugs.     Maternal Diabetes: No Genetic Screening: Normal Maternal Ultrasounds/Referrals: Normal Fetal Ultrasounds or other Referrals:  None Maternal Substance Abuse:  No Significant Maternal Medications:  None Significant Maternal Lab Results:  None Other  Comments:  None  ROS History Dilation: 3 Effacement (%): 60 Station: -2 Exam by:: MD Corinna Capra Blood pressure 113/66, pulse 77, temperature 97.9 F (36.6 C), temperature source Oral, resp. rate 16, height 5\' 9"  (1.753 m), weight 223 lb (101.2 kg), last menstrual period 09/07/2015, SpO2 98 %. Exam Physical Exam  Prenatal labs: ABO, Rh: --/--/A POS (10/13 0130) Antibody: NEG (10/13 0130) Rubella: Equivocal (03/28 0000) RPR: Nonreactive (03/28 0000)  HBsAg: Negative (03/28 0000)  HIV: Non-reactive (03/28 0000)  GBS: Negative (10/13 0000)   Assessment/Plan: IUP at term IOL - AROM, pit prn Anticpate SVD   Erika Hussar C 06/14/2016, 9:26 AM

## 2016-06-14 NOTE — Anesthesia Pain Management Evaluation Note (Signed)
  CRNA Pain Management Visit Note  Patient: Shannon Hale, 35 y.o., female  "Hello I am a member of the anesthesia team at [facility]. We have an anesthesia team available at all times to provide care throughout the hospital, including epidural management and anesthesia for C-section. I don't know your plan for the delivery whether it a natural birth, water birth, IV sedation, nitrous supplementation, doula or epidural, but we want to meet your pain goals."   1.Was your pain managed to your expectations on prior hospitalizations?   Yes   2.What is your expectation for pain management during this hospitalization?     Epidural  3.How can we help you reach that goal? Make my left side more comfortable.  Record the patient's initial score and the patient's pain goal.   Pain: 7, now a 2  Pain Goal: 2 [facility] wants you to be able to say your pain was always managed very well.  Shannon Hale 06/14/2016

## 2016-06-14 NOTE — Progress Notes (Signed)
Late decels and vbls continue with pushing. Pt positioned on right side. Presenting part up to +3 station with push. Pt asked to rest for baby to recover to baseline from 70's to 125. IV fluid bolus started. Dr Corinna Capra updated on above information and asked to attend further pushing sessions. Coming to see pt. Moderate bloody show noted when I assumed care of pt at 1240, none at this time.

## 2016-06-14 NOTE — Progress Notes (Signed)
The patient stated that her left side was not as numb as her right side and was uncomfortable despite turning. Epidural catheter inspected and was at 10 cm at the skin. The catheter was pulled back to 9 cm and a bolus was given on the epidural pump. Within 10 minutes the paint was pain free and comfortable supine.

## 2016-06-15 LAB — CBC
HCT: 29.2 % — ABNORMAL LOW (ref 36.0–46.0)
Hemoglobin: 10.1 g/dL — ABNORMAL LOW (ref 12.0–15.0)
MCH: 28.5 pg (ref 26.0–34.0)
MCHC: 34.6 g/dL (ref 30.0–36.0)
MCV: 82.3 fL (ref 78.0–100.0)
PLATELETS: 208 10*3/uL (ref 150–400)
RBC: 3.55 MIL/uL — AB (ref 3.87–5.11)
RDW: 13.5 % (ref 11.5–15.5)
WBC: 9.2 10*3/uL (ref 4.0–10.5)

## 2016-06-15 NOTE — Lactation Note (Signed)
This note was copied from a baby's chart. Lactation Consultation Note  Patient Name: Shannon Hale [id number] Date: 06/15/2016 Reason for consult: Follow-up assessment Baby awake when LC returned. Demonstrated to Mom how to use hand pump to soften nipple/aerola to help with latch. Using breast compression Mom latched baby in football hold but it was very painful and baby did not appear to have good depth, tucks lower lip. Initiated #24 nipple shield. Baby demonstrated some good suckling bursts, appeared to have deeper latch. Mom reported pain with nursing but it did improve the longer baby was on the breast. Scant amount of colostrum present in nipple shield. Plan for d/c: Use #24 nipple shield to latch due to flat nipples and nipple trauma/pain Wear breast shells between feedings alternating with comfort gels. Prepump for few minutes to bring nipple out so nipple shield will stay on better Advised baby should be at breast 8-12 times in 24 hours and with feeding ques, nursing 15-20 minutes both breasts some feedings. Mom to post pump after feedings except at night, at least 4 times per day for 15 minutes. Mom has DEBP at home. If it becomes too painful to latch, then advised Mom to pump every 3 hours for 15 minutes and pump/bottle for 24-36 hours, letting nipples heal then start to work with latch again.  Supplemental guideline sheet given to Mom if needed with BF and with pump/bottle feeding.  Advised to give baby back any amount of EBM she receives from post pumping.  For sore nipples, use EBM along with coconut oil alternating with EBM/comfort gels.  Mom has OP lactation f/u at [facility] next week.  Encouraged to call for questions/concerns.   Maternal Data Has patient been taught Hand Expression?: Yes Does the patient have breastfeeding experience prior to this delivery?: Yes  Feeding Feeding Type: Breast Fed Length of feed: 10 min  LATCH Score/Interventions Latch: Grasps breast  easily, tongue down, lips flanged, rhythmical sucking. (using 24 nipple shield)  Audible Swallowing: A few with stimulation  Type of Nipple: Flat  Comfort (Breast/Nipple): Filling, red/small blisters or bruises, mild/mod discomfort  Problem noted: Cracked, bleeding, blisters, bruises;Mild/Moderate discomfort Interventions  (Cracked/bleeding/bruising/blister): Hand pump;Expressed breast milk to nipple Interventions (Mild/moderate discomfort): Comfort gels  Hold (Positioning): Assistance needed to correctly position infant at breast and maintain latch. Intervention(s): Breastfeeding basics reviewed;Support Pillows;Position options;Skin to skin  LATCH Score: 6  Lactation Tools Discussed/Used Tools: Shells;Nipple [place] Fuel;Pump Nipple shield size: 24 Breast pump type: Manual   Consult Status Consult Status: Complete Date: 06/15/16 Follow-up type: In-patient    Katrine Coho 06/15/2016, 3:12 PM

## 2016-06-15 NOTE — Discharge Summary (Signed)
Obstetric Discharge Summary Reason for Admission: induction of labor Prenatal Procedures: none Intrapartum Procedures: spontaneous vaginal delivery Postpartum Procedures: none Complications-Operative and Postpartum: 2 degree perineal laceration Hemoglobin  Date Value Ref Range Status  06/15/2016 10.1 (L) 12.0 - 15.0 g/dL Final   HCT  Date Value Ref Range Status  06/15/2016 29.2 (L) 36.0 - 46.0 % Final    Physical Exam:  General: alert, cooperative, appears stated age and no distress Lochia: appropriate Uterine Fundus: firm Incision: healing well DVT Evaluation: No evidence of DVT seen on physical exam.  Discharge Diagnoses: Term Pregnancy-delivered  Discharge Information: Date: 06/15/2016 Activity: pelvic rest Diet: routine Medications: Ibuprofen Condition: stable Instructions: refer to practice specific booklet Discharge to: home   Newborn Data: Live born female  Birth Weight: 8 lb 3.9 oz (3740 g) APGAR: 9, 9  Home with mother and circ before d/c.  Stephan Draughn C 06/15/2016, 9:38 AM

## 2016-06-15 NOTE — Lactation Note (Signed)
This note was copied from a baby's chart. Lactation Consultation Note  Patient Name: Shannon Hale [id number] Date: 06/15/2016 Reason for consult: Follow-up assessment Baby asleep STS on Mom, had circumcision this am. Mom reporting nipple soreness. Nipple radiating to base and on aerola, firey red, bruising noted on left nipple. Mom stopped BF older children due to nipple pain. Demonstrated hand expression and was going to finger feed baby but only obtained 1-2 drops. Advised Mom to keep baby STS and I would be back within the hour to see if baby would wake to BF. Discussed applying EBM to sore nipples, advised to ask RN for coconut oil.  Left Lactation brochure for Mom's review. Advised of OP services and support group. Encouraged to call if baby awakens to BF before [place] returns.   Maternal Data Has patient been taught Hand Expression?: Yes Does the patient have breastfeeding experience prior to this delivery?: Yes  Feeding Feeding Type: Breast Fed Length of feed: 10 min  LATCH Score/Interventions Latch: Grasps breast easily, tongue down, lips flanged, rhythmical sucking. (using 24 nipple shield)  Audible Swallowing: A few with stimulation  Type of Nipple: Flat  Comfort (Breast/Nipple): Filling, red/small blisters or bruises, mild/mod discomfort  Problem noted: Cracked, bleeding, blisters, bruises;Mild/Moderate discomfort Interventions  (Cracked/bleeding/bruising/blister): Hand pump;Expressed breast milk to nipple Interventions (Mild/moderate discomfort): Comfort gels  Hold (Positioning): Assistance needed to correctly position infant at breast and maintain latch. Intervention(s): Breastfeeding basics reviewed;Support Pillows;Position options;Skin to skin  LATCH Score: 6  Lactation Tools Discussed/Used Tools: Shells;Nipple [place] Fuel;Pump Nipple shield size: 24 Breast pump type: Manual   Consult Status Consult Status: Complete Date: 06/15/16 Follow-up type:  In-patient    Katrine Coho 06/15/2016, 3:06 PM

## 2016-06-21 MED FILL — CEPHALEXIN 500 MG CAPSULE: 500 | 7 days supply | Qty: 21 | Fill #0

## 2016-07-10 DIAGNOSIS — H52223 Regular astigmatism, bilateral: Secondary | ICD-10-CM | POA: Diagnosis not present

## 2016-07-10 DIAGNOSIS — H5213 Myopia, bilateral: Secondary | ICD-10-CM | POA: Diagnosis not present

## 2016-07-23 DIAGNOSIS — Z1389 Encounter for screening for other disorder: Secondary | ICD-10-CM | POA: Diagnosis not present

## 2016-07-23 DIAGNOSIS — Z6830 Body mass index (BMI) 30.0-30.9, adult: Secondary | ICD-10-CM | POA: Diagnosis not present

## 2016-07-23 DIAGNOSIS — Z01419 Encounter for gynecological examination (general) (routine) without abnormal findings: Secondary | ICD-10-CM | POA: Diagnosis not present

## 2016-08-06 MED FILL — NORETHINDRONE 0.35 MG TAB: 0.35 | 84 days supply | Qty: 84 | Fill #0

## 2016-08-15 MED FILL — CEPHALEXIN 500 MG CAPSULE: 500 | 7 days supply | Qty: 21 | Fill #0

## 2016-08-21 MED FILL — CEPHALEXIN 500 MG CAPSULE: 500 | 7 days supply | Qty: 21 | Fill #0

## 2016-10-15 MED FILL — NORETHIN-ESTRAD-FERR 1-0.02: 1-20 | 84 days supply | Qty: 84 | Fill #0

## 2017-02-04 MED FILL — OCELLA TABLET: 3-0.03 | 28 days supply | Qty: 28 | Fill #0

## 2017-03-21 ENCOUNTER — Telehealth: Payer: 59 | Admitting: Family

## 2017-03-21 DIAGNOSIS — J329 Chronic sinusitis, unspecified: Secondary | ICD-10-CM

## 2017-03-21 DIAGNOSIS — B9689 Other specified bacterial agents as the cause of diseases classified elsewhere: Secondary | ICD-10-CM | POA: Diagnosis not present

## 2017-03-21 MED ORDER — AMOXICILLIN-POT CLAVULANATE 875-125 MG PO TABS: 1.0000 | ORAL_TABLET | Freq: Two times a day (BID) | ORAL | 0 refills | Status: AC

## 2017-03-21 MED FILL — AMOX-CLAV 875-125 MG TABLET: 875-125 | 7 days supply | Qty: 14 | Fill #0

## 2017-03-21 NOTE — Progress Notes (Signed)
Thank you for the details you put in the comment boxes. Those details really help us take better care of you.   We are sorry that you are not feeling well.  Here is how we plan to help!  Based on what you have shared with me it looks like you have sinusitis.  Sinusitis is inflammation and infection in the sinus cavities of the head.  Based on your presentation I believe you most likely have Acute Bacterial Sinusitis.  This is an infection caused by bacteria and is treated with antibiotics. I have prescribed Augmentin 875mg/125mg one tablet twice daily with food, for 7 days. You may use an oral decongestant such as Mucinex D or if you have glaucoma or high blood pressure use plain Mucinex. Saline nasal spray help and can safely be used as often as needed for congestion.  If you develop worsening sinus pain, fever or notice severe headache and vision changes, or if symptoms are not better after completion of antibiotic, please schedule an appointment with a health care provider.    Sinus infections are not as easily transmitted as other respiratory infection, however we still recommend that you avoid close contact with loved ones, especially the very young and elderly.  Remember to wash your hands thoroughly throughout the day as this is the number one way to prevent the spread of infection!  Home Care:  Only take medications as instructed by your medical team.  Complete the entire course of an antibiotic.  Do not take these medications with alcohol.  A steam or ultrasonic humidifier can help congestion.  You can place a towel over your head and breathe in the steam from hot water coming from a faucet.  Avoid close contacts especially the very young and the elderly.  Cover your mouth when you cough or sneeze.  Always remember to wash your hands.  Get Help Right Away If:  You develop worsening fever or sinus pain.  You develop a severe head ache or visual changes.  Your symptoms persist  after you have completed your treatment plan.  Make sure you  Understand these instructions.  Will watch your condition.  Will get help right away if you are not doing well or get worse.  Your e-visit answers were reviewed by a board certified advanced clinical practitioner to complete your personal care plan.  Depending on the condition, your plan could have included both over the counter or prescription medications.  If there is a problem please reply  once you have received a response from your provider.  Your safety is important to us.  If you have drug allergies check your prescription carefully.    You can use MyChart to ask questions about today's visit, request a non-urgent call back, or ask for a work or school excuse for 24 hours related to this e-Visit. If it has been greater than 24 hours you will need to follow up with your provider, or enter a new e-Visit to address those concerns.  You will get an e-mail in the next two days asking about your experience.  I hope that your e-visit has been valuable and will speed your recovery. Thank you for using e-visits.    

## 2017-03-27 MED FILL — OCELLA TABLET: 3-0.03 | 84 days supply | Qty: 84 | Fill #1

## 2017-04-18 MED FILL — PREVIDENT 5000 BOOSTER PLUS: 1.1 | 30 days supply | Qty: 100 | Fill #0

## 2017-07-04 MED FILL — OCELLA TABLET: 3-0.03 | 84 days supply | Qty: 84 | Fill #2

## 2017-09-16 ENCOUNTER — Telehealth: Payer: 59 | Admitting: Family

## 2017-09-16 DIAGNOSIS — J019 Acute sinusitis, unspecified: Secondary | ICD-10-CM

## 2017-09-16 DIAGNOSIS — B9689 Other specified bacterial agents as the cause of diseases classified elsewhere: Secondary | ICD-10-CM | POA: Diagnosis not present

## 2017-09-16 MED ORDER — AMOXICILLIN-POT CLAVULANATE 875-125 MG PO TABS: 1.0000 | ORAL_TABLET | Freq: Two times a day (BID) | ORAL | 0 refills | Status: DC

## 2017-09-16 MED FILL — AMOX-CLAV 875-125 MG TABLET: 875-125 | 7 days supply | Qty: 14 | Fill #0

## 2017-09-16 NOTE — Progress Notes (Signed)

## 2017-11-03 MED FILL — OCELLA TABLET: 3-0.03 | 28 days supply | Qty: 28 | Fill #0

## 2017-11-20 ENCOUNTER — Telehealth: Payer: Self-pay | Admitting: *Deleted

## 2017-11-20 NOTE — Telephone Encounter (Signed)
I am not listed as her PCP. Shannon Hale currently is. I have no issue with her transferring.

## 2017-11-20 NOTE — Telephone Encounter (Signed)
Routing to providers to advise if okay to transfer from Shannon Hale to Shannon Hale.   Copied from [place] [id number]. Topic: Appointment Scheduling - Scheduling Inquiry for Clinic >> Nov 20, 2017  8:32 AM Synthia Innocent wrote: Reason for CRM: Requesting to transfer care from Shannon Hale, last seen June 2016, requesting to see Dr Shannon Hale. Please advise

## 2017-11-20 NOTE — Telephone Encounter (Signed)
Ok with me 

## 2017-11-21 NOTE — Telephone Encounter (Signed)
Ok with me 

## 2017-11-30 NOTE — Progress Notes (Signed)
[place] [facility] [address], [place], [place] [address] [phone]  Date:  12/01/2017   Name:  Shannon Hale   DOB:  02/13/1981   MRN:  [id number]  PCP:  Darreld Mclean, MD    Chief Complaint: Transfer (Former pt of Debbrah Alar here today to transfer care. Pt will need a CPE for work. )   History of Present Illness:  Shannon Hale is a 37 y.o. 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   She works weekends now which works better for her family, and she can keep her son out of daycare as he kept getting sick when he was in daycare last year In her free time she likes to read, take walks.   Her oldest is loving kindergarten and doing very well   She has generally been healthy, does not have a lot of PMHx  Did have a cholecystectomy 2015  She sees Dr. Julien Girt at [facility]- she has not had full labs in some time however and would like to order these Last pap 2-3 years ago  NKDA She is not fasting today but would like to come back for fasting labs soon  Her mother has CLL and history of breast cancer dx in her mid 75s. She was advised to have her first mammo done at age 59s There is a family history of DM, colon cancer in her PGF, he was in his 6s or 68s at dx Her father had angina, but she is not sure if he actually has CAD  She does not have any CP or SOB with exercise She and her husband are working on getting into better shape- she has lost 13 lbs! They are changing their diet and trying to exercise wel   She smoked briefly in college but has quit for a long time, rare alcohol   She has occasional seasonal allergies  Otherwise does not have any  concerns today  Patient Active Problem List   Diagnosis Date Noted  . Obesity 09/25/2014  . Dermatitis 12/31/2013  . Cholelithiasis 02/29/2012  . BACK PAIN, THORACIC REGION 11/19/2010  . APHTHOUS STOMATITIS 08/10/2009  . MIGRAINE HEADACHE 09/23/2008    Past Medical History:  Diagnosis Date  . Abnormal Pap smear   . Headache(784.0)    migraines  . Hepatitis july 2005   unknown form of hepatitis, no current liver problems  . History of chicken pox   . History of D&C    D&E  . Nausea   . PONV (postoperative nausea and vomiting)   . Vaginal Pap smear, abnormal     Past Surgical History:  Procedure Laterality Date  . CHOLECYSTECTOMY N/A 12/01/2013   Procedure: LAPAROSCOPIC CHOLECYSTECTOMY ;  Surgeon: Harl Bowie, MD;  Location: [facility];  Service: General;  Laterality: N/A;  . DILATION AND EVACUATION N/A 07/14/2014   Procedure: DILATATION AND EVACUATION;  Surgeon: Marylynn Pearson, MD;  Location: [facility];  Service: Gynecology;  Laterality: N/A;  . SEPTOPLASTY    . TONSILLECTOMY    . WISDOM TOOTH EXTRACTION      Social History   Tobacco Use  .  Smoking status: Former Smoker    Packs/day: 0.25    Years: 0.50    Pack years: 0.12    Types: Cigarettes    Last attempt to quit: 10/11/2002    Years since quitting: 15.1  . Smokeless tobacco: Never Used  Substance Use Topics  . Alcohol use: No    Alcohol/week: 0.0 oz  . Drug use: No    Family History  Problem Relation Age of Onset  . Cancer Mother        breast, leaukemia-Living  . Angina Father   . Hypertension Maternal Grandmother   . Diabetes Maternal Grandmother   . Depression Maternal Grandmother        anxiety also  . Cancer Maternal Grandmother        breast  . Diabetes Paternal Grandmother   . Heart disease Paternal Grandfather   . Cancer Paternal Grandfather        colon  . Cancer Maternal Aunt        cervical  . Seizures Brother        Resolved  . Diabetes Paternal Uncle   . Healthy Brother         x2  . Healthy Son        x1    No Known Allergies  Medication list has been reviewed and updated.  Current Outpatient Medications on File Prior to Visit  Medication Sig Dispense Refill  . Multiple Vitamin (MULTIVITAMIN) tablet Take 1 tablet by mouth daily.    . OCELLA 3-0.03 MG tablet Take 1 tablet by mouth daily.  2   No current facility-administered medications on file prior to visit.     Review of Systems:  As per HPI- otherwise negative. No fever or chills Periodic allergy sx, periodic sinus sx No CP or SOB with exercise   Physical Examination: Vitals:   12/01/17 1002  BP: 110/72  Pulse: 86  Temp: 98.1 F (36.7 C)  SpO2: 96%   Vitals:   12/01/17 1002  Weight: 212 lb 3.2 oz (96.3 kg)  Height: 5\' 9"  (1.753 m)   Body mass index is 31.34 kg/m. Ideal Body Weight: Weight in (lb) to have BMI = 25: 168.9  GEN: WDWN, NAD, Non-toxic, A & O x 3, overweight, looks well  HEENT: Atraumatic, Normocephalic. Neck supple. No masses, No LAD.  Bilateral TM wnl, oropharynx normal.  PEERL,EOMI.   Ears and Nose: No external deformity. CV: RRR, No M/G/R. No JVD. No thrill. No extra heart sounds. PULM: CTA B, no wheezes, crackles, rhonchi. No retractions. No resp. distress. No accessory muscle use. ABD: S, NT, ND. No rebound. No HSM. EXTR: No c/c/e NEURO Normal gait.   Normal DTR all extremities  PSYCH: Normally interactive. Conversant. Not depressed or anxious appearing.  Calm demeanor.    Assessment and Plan: Physical exam  Screening for hyperlipidemia - Plan: Lipid panel  Screening for diabetes mellitus - Plan: Comprehensive metabolic panel, Hemoglobin A1c  Screening for deficiency anemia - Plan: CBC  Here today for a CPE Immunizations are UTD Pap per GYN Labs ordered today, she will come in for fasting labs at her earliest convenience  Will plan further follow- up pending labs.   Signed Lamar Blinks, MD

## 2017-12-01 ENCOUNTER — Encounter: Payer: Self-pay | Admitting: Family Medicine

## 2017-12-01 ENCOUNTER — Ambulatory Visit (INDEPENDENT_AMBULATORY_CARE_PROVIDER_SITE_OTHER): Payer: 59 | Admitting: Family Medicine

## 2017-12-01 VITALS — BP 110/72 | HR 86 | Temp 98.1°F | Ht 69.0 in | Wt 212.2 lb

## 2017-12-01 DIAGNOSIS — Z0000 Encounter for general adult medical examination without abnormal findings: Secondary | ICD-10-CM | POA: Diagnosis not present

## 2017-12-01 DIAGNOSIS — Z13220 Encounter for screening for lipoid disorders: Secondary | ICD-10-CM

## 2017-12-01 DIAGNOSIS — Z130 Encounter for screening for diseases of the blood and blood-forming organs and certain disorders involving the immune mechanism: Secondary | ICD-10-CM

## 2017-12-01 DIAGNOSIS — Z131 Encounter for screening for diabetes mellitus: Secondary | ICD-10-CM

## 2017-12-01 NOTE — Patient Instructions (Signed)
Very nice to see you today!  Please come in for fasting labs at your convenience  Take care and I will be in touch with your labs when they come in   Health Maintenance, Female Adopting a healthy lifestyle and getting preventive care can go a long way to promote health and wellness. Talk with your health care provider about what schedule of regular examinations is right for you. This is a good chance for you to check in with your provider about disease prevention and staying healthy. In between checkups, there are plenty of things you can do on your own. Experts have done a lot of research about which lifestyle changes and preventive measures are most likely to keep you healthy. Ask your health care provider for more information. Weight and diet Eat a healthy diet  Be sure to include plenty of vegetables, fruits, low-fat dairy products, and lean protein.  Do not eat a lot of foods high in solid fats, added sugars, or salt.  Get regular exercise. This is one of the most important things you can do for your health. ? Most adults should exercise for at least 150 minutes each week. The exercise should increase your heart rate and make you sweat (moderate-intensity exercise). ? Most adults should also do strengthening exercises at least twice a week. This is in addition to the moderate-intensity exercise.  Maintain a healthy weight  Body mass index (BMI) is a measurement that can be used to identify possible weight problems. It estimates body fat based on height and weight. Your health care provider can help determine your BMI and help you achieve or maintain a healthy weight.  For females 37 years of age and older: ? A BMI below 18.5 is considered underweight. ? A BMI of 18.5 to 24.9 is normal. ? A BMI of 25 to 29.9 is considered overweight. ? A BMI of 30 and above is considered obese.  Watch levels of cholesterol and blood lipids  You should start having your blood tested for lipids and  cholesterol at 37 years of age, then have this test every 5 years.  You may need to have your cholesterol levels checked more often if: ? Your lipid or cholesterol levels are high. ? You are older than 37 years of age. ? You are at high risk for heart disease.  Cancer screening Lung Cancer  Lung cancer screening is recommended for adults 37-42 years old who are at high risk for lung cancer because of a history of smoking.  A yearly low-dose CT scan of the lungs is recommended for people who: ? Currently smoke. ? Have quit within the past 15 years. ? Have at least a 30-pack-year history of smoking. A pack year is smoking an average of one pack of cigarettes a day for 1 year.  Yearly screening should continue until it has been 15 years since you quit.  Yearly screening should stop if you develop a health problem that would prevent you from having lung cancer treatment.  Breast Cancer  Practice breast self-awareness. This means understanding how your breasts normally appear and feel.  It also means doing regular breast self-exams. Let your health care provider know about any changes, no matter how small.  If you are in your 20s or 30s, you should have a clinical breast exam (CBE) by a health care provider every 1-3 years as part of a regular health exam.  If you are 37 or older, have a CBE every year. Also  consider having a breast X-ray (mammogram) every year.  If you have a family history of breast cancer, talk to your health care provider about genetic screening.  If you are at high risk for breast cancer, talk to your health care provider about having an MRI and a mammogram every year.  Breast cancer gene (BRCA) assessment is recommended for women who have family members with BRCA-related cancers. BRCA-related cancers include: ? Breast. ? Ovarian. ? Tubal. ? Peritoneal cancers.  Results of the assessment will determine the need for genetic counseling and BRCA1 and BRCA2  testing.  Cervical Cancer Your health care provider may recommend that you be screened regularly for cancer of the pelvic organs (ovaries, uterus, and vagina). This screening involves a pelvic examination, including checking for microscopic changes to the surface of your cervix (Pap test). You may be encouraged to have this screening done every 3 years, beginning at age 37.  For women ages 60-65, health care providers may recommend pelvic exams and Pap testing every 3 years, or they may recommend the Pap and pelvic exam, combined with testing for human papilloma virus (HPV), every 5 years. Some types of HPV increase your risk of cervical cancer. Testing for HPV may also be done on women of any age with unclear Pap test results.  Other health care providers may not recommend any screening for nonpregnant women who are considered low risk for pelvic cancer and who do not have symptoms. Ask your health care provider if a screening pelvic exam is right for you.  If you have had past treatment for cervical cancer or a condition that could lead to cancer, you need Pap tests and screening for cancer for at least 20 years after your treatment. If Pap tests have been discontinued, your risk factors (such as having a new sexual partner) need to be reassessed to determine if screening should resume. Some women have medical problems that increase the chance of getting cervical cancer. In these cases, your health care provider may recommend more frequent screening and Pap tests.  Colorectal Cancer  This type of cancer can be detected and often prevented.  Routine colorectal cancer screening usually begins at 37 years of age and continues through 37 years of age.  Your health care provider may recommend screening at an earlier age if you have risk factors for colon cancer.  Your health care provider may also recommend using home test kits to check for hidden blood in the stool.  A small camera at the end of a  tube can be used to examine your colon directly (sigmoidoscopy or colonoscopy). This is done to check for the earliest forms of colorectal cancer.  Routine screening usually begins at age 37.  Direct examination of the colon should be repeated every 5-10 years through 37 years of age. However, you may need to be screened more often if early forms of precancerous polyps or small growths are found.  Skin Cancer  Check your skin from head to toe regularly.  Tell your health care provider about any new moles or changes in moles, especially if there is a change in a mole's shape or color.  Also tell your health care provider if you have a mole that is larger than the size of a pencil eraser.  Always use sunscreen. Apply sunscreen liberally and repeatedly throughout the day.  Protect yourself by wearing long sleeves, pants, a wide-brimmed hat, and sunglasses whenever you are outside.  Heart disease, diabetes, and high blood  pressure  High blood pressure causes heart disease and increases the risk of stroke. High blood pressure is more likely to develop in: ? People who have blood pressure in the high end of the normal range (130-139/85-89 mm Hg). ? People who are overweight or obese. ? People who are African American.  If you are 34-17 years of age, have your blood pressure checked every 3-5 years. If you are 79 years of age or older, have your blood pressure checked every year. You should have your blood pressure measured twice-once when you are at a hospital or clinic, and once when you are not at a hospital or clinic. Record the average of the two measurements. To check your blood pressure when you are not at a hospital or clinic, you can use: ? An automated blood pressure machine at a pharmacy. ? A home blood pressure monitor.  If you are between 15 years and 57 years old, ask your health care provider if you should take aspirin to prevent strokes.  Have regular diabetes screenings. This  involves taking a blood sample to check your fasting blood sugar level. ? If you are at a normal weight and have a low risk for diabetes, have this test once every three years after 37 years of age. ? If you are overweight and have a high risk for diabetes, consider being tested at a younger age or more often. Preventing infection Hepatitis B  If you have a higher risk for hepatitis B, you should be screened for this virus. You are considered at high risk for hepatitis B if: ? You were born in a country where hepatitis B is common. Ask your health care provider which countries are considered high risk. ? Your parents were born in a high-risk country, and you have not been immunized against hepatitis B (hepatitis B vaccine). ? You have HIV or AIDS. ? You use needles to inject street drugs. ? You live with someone who has hepatitis B. ? You have had sex with someone who has hepatitis B. ? You get hemodialysis treatment. ? You take certain medicines for conditions, including cancer, organ transplantation, and autoimmune conditions.  Hepatitis C  Blood testing is recommended for: ? Everyone born from [birth year] through 1965. ? Anyone with known risk factors for hepatitis C.  Sexually transmitted infections (STIs)  You should be screened for sexually transmitted infections (STIs) including gonorrhea and chlamydia if: ? You are sexually active and are younger than 37 years of age. ? You are older than 37 years of age and your health care provider tells you that you are at risk for this type of infection. ? Your sexual activity has changed since you were last screened and you are at an increased risk for chlamydia or gonorrhea. Ask your health care provider if you are at risk.  If you do not have HIV, but are at risk, it may be recommended that you take a prescription medicine daily to prevent HIV infection. This is called pre-exposure prophylaxis (PrEP). You are considered at risk if: ? You are  sexually active and do not regularly use condoms or know the HIV status of your partner(s). ? You take drugs by injection. ? You are sexually active with a partner who has HIV.  Talk with your health care provider about whether you are at high risk of being infected with HIV. If you choose to begin PrEP, you should first be tested for HIV. You should then be tested every  3 months for as long as you are taking PrEP. Pregnancy  If you are premenopausal and you may become pregnant, ask your health care provider about preconception counseling.  If you may become pregnant, take 400 to 800 micrograms (mcg) of folic acid every day.  If you want to prevent pregnancy, talk to your health care provider about birth control (contraception). Osteoporosis and menopause  Osteoporosis is a disease in which the bones lose minerals and strength with aging. This can result in serious bone fractures. Your risk for osteoporosis can be identified using a bone density scan.  If you are 16 years of age or older, or if you are at risk for osteoporosis and fractures, ask your health care provider if you should be screened.  Ask your health care provider whether you should take a calcium or vitamin D supplement to lower your risk for osteoporosis.  Menopause may have certain physical symptoms and risks.  Hormone replacement therapy may reduce some of these symptoms and risks. Talk to your health care provider about whether hormone replacement therapy is right for you. Follow these instructions at home:  Schedule regular health, dental, and eye exams.  Stay current with your immunizations.  Do not use any tobacco products including cigarettes, chewing tobacco, or electronic cigarettes.  If you are pregnant, do not drink alcohol.  If you are breastfeeding, limit how much and how often you drink alcohol.  Limit alcohol intake to no more than 1 drink per day for nonpregnant women. One drink equals 12 ounces of  beer, 5 ounces of wine, or 1 ounces of hard liquor.  Do not use street drugs.  Do not share needles.  Ask your health care provider for help if you need support or information about quitting drugs.  Tell your health care provider if you often feel depressed.  Tell your health care provider if you have ever been abused or do not feel safe at home. This information is not intended to replace advice given to you by your health care provider. Make sure you discuss any questions you have with your health care provider. Document Released: 03/04/2011 Document Revised: 01/25/2016 Document Reviewed: 05/23/2015 Elsevier Interactive Patient Education  [employer].

## 2017-12-03 ENCOUNTER — Other Ambulatory Visit (INDEPENDENT_AMBULATORY_CARE_PROVIDER_SITE_OTHER): Payer: 59

## 2017-12-03 ENCOUNTER — Encounter: Payer: Self-pay | Admitting: Family Medicine

## 2017-12-03 DIAGNOSIS — Z130 Encounter for screening for diseases of the blood and blood-forming organs and certain disorders involving the immune mechanism: Secondary | ICD-10-CM | POA: Diagnosis not present

## 2017-12-03 DIAGNOSIS — Z13220 Encounter for screening for lipoid disorders: Secondary | ICD-10-CM

## 2017-12-03 DIAGNOSIS — Z131 Encounter for screening for diabetes mellitus: Secondary | ICD-10-CM | POA: Diagnosis not present

## 2017-12-03 LAB — CBC
HCT: 37.8 % (ref 36.0–46.0)
Hemoglobin: 12.9 g/dL (ref 12.0–15.0)
MCHC: 34.1 g/dL (ref 30.0–36.0)
MCV: 82.6 fl (ref 78.0–100.0)
Platelets: 251 10*3/uL (ref 150.0–400.0)
RBC: 4.58 Mil/uL (ref 3.87–5.11)
RDW: 13.7 % (ref 11.5–15.5)
WBC: 5.2 10*3/uL (ref 4.0–10.5)

## 2017-12-03 LAB — COMPREHENSIVE METABOLIC PANEL
ALBUMIN: 4 g/dL (ref 3.5–5.2)
ALK PHOS: 76 U/L (ref 39–117)
ALT: 22 U/L (ref 0–35)
AST: 26 U/L (ref 0–37)
BILIRUBIN TOTAL: 0.4 mg/dL (ref 0.2–1.2)
BUN: 5 mg/dL — ABNORMAL LOW (ref 6–23)
CALCIUM: 9.1 mg/dL (ref 8.4–10.5)
CO2: 26 mEq/L (ref 19–32)
CREATININE: 0.71 mg/dL (ref 0.40–1.20)
Chloride: 104 mEq/L (ref 96–112)
GFR: 98.81 mL/min (ref >60.00)
Glucose, Bld: 92 mg/dL (ref 70–99)
Potassium: 4.2 mEq/L (ref 3.5–5.1)
Sodium: 137 mEq/L (ref 135–145)
TOTAL PROTEIN: 7.6 g/dL (ref 6.0–8.3)

## 2017-12-03 LAB — LIPID PANEL
CHOL/HDL RATIO: 3
Cholesterol: 224 mg/dL — ABNORMAL HIGH (ref 0–200)
HDL: 78.3 mg/dL (ref >39.00)
LDL Cholesterol: 114 mg/dL — ABNORMAL HIGH (ref 0–99)
NonHDL: 145.7
Triglycerides: 161 mg/dL — ABNORMAL HIGH (ref 0.0–149.0)
VLDL: 32.2 mg/dL (ref 0.0–40.0)

## 2017-12-03 LAB — HEMOGLOBIN A1C: Hgb A1c MFr Bld: 5.5 % (ref 4.6–6.5)

## 2017-12-03 NOTE — Progress Notes (Signed)
Received her labs Results for orders placed or performed in visit on 12/03/17  Lipid panel  Result Value Ref Range   Cholesterol 224 (H) 0 - 200 mg/dL   Triglycerides 161.0 (H) 0.0 - 149.0 mg/dL   HDL 78.30 >39.00 mg/dL   VLDL 32.2 0.0 - 40.0 mg/dL   LDL Cholesterol 114 (H) 0 - 99 mg/dL   Total CHOL/HDL Ratio 3    NonHDL 145.70   Hemoglobin A1c  Result Value Ref Range   Hgb A1c MFr Bld 5.5 4.6 - 6.5 %  Comprehensive metabolic panel  Result Value Ref Range   Sodium 137 135 - 145 mEq/L   Potassium 4.2 3.5 - 5.1 mEq/L   Chloride 104 96 - 112 mEq/L   CO2 26 19 - 32 mEq/L   Glucose, Bld 92 70 - 99 mg/dL   BUN 5 (L) 6 - 23 mg/dL   Creatinine, Ser 0.71 0.40 - 1.20 mg/dL   Total Bilirubin 0.4 0.2 - 1.2 mg/dL   Alkaline Phosphatase 76 39 - 117 U/L   AST 26 0 - 37 U/L   ALT 22 0 - 35 U/L   Total Protein 7.6 6.0 - 8.3 g/dL   Albumin 4.0 3.5 - 5.2 g/dL   Calcium 9.1 8.4 - 10.5 mg/dL   GFR 98.81 >60.00 mL/min  CBC  Result Value Ref Range   WBC 5.2 4.0 - 10.5 K/uL   RBC 4.58 3.87 - 5.11 Mil/uL   Platelets 251.0 150.0 - 400.0 K/uL   Hemoglobin 12.9 12.0 - 15.0 g/dL   HCT 37.8 36.0 - 46.0 %   MCV 82.6 78.0 - 100.0 fl   MCHC 34.1 30.0 - 36.0 g/dL   RDW 13.7 11.5 - 15.5 %   Message to pt

## 2017-12-04 ENCOUNTER — Telehealth: Payer: 59 | Admitting: Physician Assistant

## 2017-12-04 DIAGNOSIS — J019 Acute sinusitis, unspecified: Secondary | ICD-10-CM | POA: Diagnosis not present

## 2017-12-04 DIAGNOSIS — B9689 Other specified bacterial agents as the cause of diseases classified elsewhere: Secondary | ICD-10-CM

## 2017-12-04 MED ORDER — AMOXICILLIN-POT CLAVULANATE 875-125 MG PO TABS: 1.0000 | ORAL_TABLET | Freq: Two times a day (BID) | ORAL | 0 refills | Status: DC

## 2017-12-04 MED FILL — AMOX-CLAV 875-125 MG TABLET: 875-125 | 7 days supply | Qty: 14 | Fill #0

## 2017-12-04 NOTE — Progress Notes (Signed)

## 2017-12-17 MED FILL — OCELLA TABLET: 3-0.03 | 28 days supply | Qty: 28 | Fill #1

## 2018-01-09 DIAGNOSIS — Z01419 Encounter for gynecological examination (general) (routine) without abnormal findings: Secondary | ICD-10-CM | POA: Diagnosis not present

## 2018-01-09 DIAGNOSIS — Z6831 Body mass index (BMI) 31.0-31.9, adult: Secondary | ICD-10-CM | POA: Diagnosis not present

## 2018-01-09 DIAGNOSIS — Z1231 Encounter for screening mammogram for malignant neoplasm of breast: Secondary | ICD-10-CM | POA: Diagnosis not present

## 2018-01-09 MED FILL — OCELLA TABLET: 3-0.03 | 84 days supply | Qty: 84 | Fill #0

## 2018-03-30 ENCOUNTER — Encounter: Payer: Self-pay | Admitting: Registered"

## 2018-03-30 ENCOUNTER — Encounter: Payer: 59 | Attending: Family Medicine | Admitting: Registered"

## 2018-03-30 DIAGNOSIS — Z713 Dietary counseling and surveillance: Secondary | ICD-10-CM | POA: Insufficient documentation

## 2018-03-30 NOTE — Patient Instructions (Addendum)
Omega 3 ideas: Consider having Tuna at least 1x week. Ask sister-in-law for ideas to prepare salmon. Trout is also a good source. Fish oil, [employer] might be a good choice, keep in fridge Walnuts, chia sees, ground flaxseed   Pistachios are good to help lower cholesterol, can look for plant stanol in products.  Goal: Meal prepping Wednesdays: brown rice pasta Thursdays: Chicken and veggies Continue having raw veggies snacks

## 2018-03-30 NOTE — Progress Notes (Signed)
Medical Nutrition Therapy:  Appt start time: 0930 end time:  1025.  [employer] Employee visit 1 of 3  Assessment:  Primary concerns today: Patient states she would like to get better at meal prepping. Pt states she works weekends and if she doesn't take food with her she will eat fast food. Pt states she is a picky eater mostly due to textures, doesn't like slimy - tomatoes, cooked onion, peppers. Pt states many fruits are not digested well since she had her gallbladder removed. Pt states several years ago she improved diet, more whole grains, vegetables, lean meats and was sleeping better and had more energy.  Pt states stopped some of her healthy habits when her son was having health issues and states she was doing emotional eating. Pt report having already starting to switch out a few of her less healthy foods for better choices.  Pt states her husband started a carnivore diet, 1 meal per day of high fat meat and has started other habits in attempt to improve health.   Pt states she has health issues in her family including diabetes and breast cancer that she would like to avoid.   Pt states several years ago she was working minimum 3x week and really enjoys weight training. Pt states with a 37 yr old and 37 yr old she is having a hard time finding a space in her schedule to exercise much. Pt states her husband would be very willing to take over child care duties while she works out.   Pt states she started limiting dairy because she read that it can increase inflammation. Pt states she used to have frequent migraines when she was younger, but now mostly hormonal related. Pt states she drinks coke on the days when she works to help with energy.   Relevant labs: Chol 224, TG 161, LDL 114, HDL 78  Sleep: pretty good, 7 hrs. Some days wakes rested, some days not Stress: 3 out of 10 Work schedule 4-11 pm Fri, Sat & Sun 12 hr shifts.  Preferred Learning Style:   No preference indicated    Learning Readiness:   Change in progress   MEDICATIONS: reviewed   DIETARY INTAKE:  Usual eating pattern includes 2-3 meals and 0-1 snacks per day.  24-hr recall:  B ( AM): skips (gets to eat) cup of cocoa, wheaties, protein shake [employer] Snk ( AM): none  L ( PM): skips when busy 1-2x week OR wrap OR sandwich, fruit Snk ( PM): none  D ( PM): brown rice pasta OR grilled chicken OR [place] chili OR ground [place] taco salad Snk ( PM): none OR cup of cocoa Beverages: water, 2-3x bottle coke  Usual physical activity: 2-3x week 15 min  Estimated energy needs: 1800 calories  Progress Towards Goal(s):  In progress.   Nutritional Diagnosis:  NI-5.8.5 Inadeqate fiber intake As related to fast food weekend meals and meals that don't include veggies.  As evidenced by dietary recall.    Intervention:  Nutrition Education. Discussed importance of whole grains, vegetables, and omega 3 in diet. Discussed foods that can reduce cholesterol.  Teaching Method Utilized:  Visual Auditory  Handouts given during visit include:  none  Barriers to learning/adherence to lifestyle change: noen  Demonstrated degree of understanding via:  Teach Back   Monitoring/Evaluation:  Dietary intake, exercise, and body weight in 3 day(s).

## 2018-04-02 ENCOUNTER — Encounter: Payer: 59 | Attending: Family Medicine | Admitting: Registered"

## 2018-04-02 DIAGNOSIS — Z713 Dietary counseling and surveillance: Secondary | ICD-10-CM | POA: Insufficient documentation
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
## 2018-04-02 NOTE — Patient Instructions (Addendum)
Keep up the meal prepping and trying new recipes. Search for Chocolate Avocado pudding (mousse). There are simple recipes, look for Avocado, chocolate, vanilla, maple syrup (or some other sweetener) Ripe bananas are good for smoothies Exercise goal: Set alarm for 6 am 2 days next week for 30 minutes gym time.

## 2018-04-06 ENCOUNTER — Encounter: Payer: 59 | Admitting: Registered"

## 2018-04-06 DIAGNOSIS — Z713 Dietary counseling and surveillance: Secondary | ICD-10-CM

## 2018-04-06 NOTE — Progress Notes (Signed)
Medical Nutrition Therapy:  Appt start time: 6546 end time:  1110.  [facility] visit 3 of 3  Assessment:  Primary concerns today: Pt states she did not do as well this last few days due to 3-day migraine. However, Pt reports she drank most of goal of water intake. Pt states her usual chicken recipe didn't turn out so did not have that to take to work, but worked out to have just a plain vegetable soup due to nausea.  Pt states she likes to go to farmers market with children. Pt states she has not tried smoothies yet but still very interested in trying avocado as well as looking forward to trying the steel cut oatmeal. Pt states she wants to continue to try new foods to branch out for her own health as well as being a good example for her son who is a picky eater.   Pt states she has had some stress recently but prioritizes self-care and spends her first day off of the week (Monday) relaxing and letting her children play.   Preferred Learning Style:   No preference indicated   Learning Readiness:   Change in progress  MEDICATIONS: reviewed   DIETARY INTAKE:  Usual eating pattern includes 2-3 meals and 0-1 snacks per day.  24-hr recall: (minimal intake due to migraine) B ( AM): gingerale Snk ( AM): none  L ( PM): vegetable soup Snk ( PM): none  D ( PM): no dinner (migraine) Snk ( PM): none Beverages: water  Usual physical activity: ADL this week  Estimated energy needs: 1800 calories  Progress Towards Goal(s):  In progress.   Nutritional Diagnosis:  NI-5.8.5 Inadeqate fiber intake As related to fast food weekend meals and meals that don't include veggies.  As evidenced by dietary recall.    Intervention:  Nutrition Education. Discussed ways to enjoy fruits and vegetables and work with changing texture for acceptability.  Plan: Consider having canned chicken on hand Some other ideas for nausea: aroma therapy: crystalized ginger, ginger tea, ginger beer, chocolate  dipped ginger at [employer]. Zucchini roll-ups with goat cheese and fresh basil leaves makes a great appetizer.  Goals: Get workout in 2 days per week to start work up to 3 Maintain water intake, come up with plan to keep it up during work Continue branching out with new recipes  Teaching Method Utilized:  Ship broker  Handouts given during visit include:  none  Barriers to learning/adherence to lifestyle change: none  Demonstrated degree of understanding via:  Teach Back   Monitoring/Evaluation:  Dietary intake, exercise, and body weight prn.

## 2018-04-06 NOTE — Patient Instructions (Addendum)
Consider having canned chicken on hand Some other ideas for nausea: aroma therapy: crystalized ginger, ginger tea, ginger beer, chocolate dipped ginger at [employer]. Zucchini roll-ups with goat cheese and fresh basil leaves makes a great appetizer.  Goals: Get workout in 2 days per week to start work up to 3 Maintain water intake, come up with plan to keep it up during work Continue branching out with new recipes

## 2018-05-15 MED FILL — OCELLA TABLET: 3-0.03 | 84 days supply | Qty: 84 | Fill #1

## 2018-05-21 ENCOUNTER — Encounter: Payer: Self-pay | Admitting: Family Medicine

## 2018-05-21 ENCOUNTER — Ambulatory Visit: Payer: 59 | Admitting: Family Medicine

## 2018-05-21 VITALS — BP 112/74 | HR 78 | Temp 98.4°F | Ht 69.0 in | Wt 217.4 lb

## 2018-05-21 DIAGNOSIS — G4452 New daily persistent headache (NDPH): Secondary | ICD-10-CM | POA: Diagnosis not present

## 2018-05-21 MED ORDER — SUMATRIPTAN SUCCINATE 100 MG PO TABS: 100.0000 mg | ORAL_TABLET | ORAL | 0 refills | Status: DC | PRN

## 2018-05-21 MED ORDER — ONDANSETRON HCL 4 MG PO TABS: 4.0000 mg | ORAL_TABLET | Freq: Three times a day (TID) | ORAL | 0 refills | Status: DC | PRN

## 2018-05-21 MED ORDER — VENLAFAXINE HCL ER 37.5 MG PO CP24: 37.5000 mg | ORAL_CAPSULE | Freq: Every day | ORAL | 1 refills | Status: DC

## 2018-05-21 MED FILL — ONDANSETRON HCL 4 MG TABLET: 4 | 10 days supply | Qty: 30 | Fill #0

## 2018-05-21 MED FILL — SUMATRIPTAN SUCC 100 MG TAB: 100 | 30 days supply | Qty: 8 | Fill #0

## 2018-05-21 MED FILL — VENLAFAXINE HCL ER 37.5 MG: 37.5 | 30 days supply | Qty: 30 | Fill #0

## 2018-05-21 NOTE — Patient Instructions (Signed)
Heat (pad or rice pillow in microwave) over affected area, 10-15 minutes twice daily.   Definitely wear your mouth splint at night.  EXERCISES RANGE OF MOTION (ROM) AND STRETCHING EXERCISES  These exercises may help you when beginning to rehabilitate your issue. In order to successfully resolve your symptoms, you must improve your posture. These exercises are designed to help reduce the forward-head and rounded-shoulder posture which contributes to this condition. Your symptoms may resolve with or without further involvement from your physician, physical therapist or athletic trainer. While completing these exercises, remember:   Restoring tissue flexibility helps normal motion to return to the joints. This allows healthier, less painful movement and activity.  An effective stretch should be held for at least 20 seconds, although you may need to begin with shorter hold times for comfort.  A stretch should never be painful. You should only feel a gentle lengthening or release in the stretched tissue.  Do not do any stretch or exercise that you cannot tolerate.  STRETCH- Axial Extensors  Lie on your back on the floor. You may bend your knees for comfort. Place a rolled-up hand towel or dish towel, about 2 inches in diameter, under the part of your head that makes contact with the floor.  Gently tuck your chin, as if trying to make a "double chin," until you feel a gentle stretch at the base of your head.  Hold 15-20 seconds. Repeat 2-3 times. Complete this exercise 1 time per day.   STRETCH - Axial Extension   Stand or sit on a firm surface. Assume a good posture: chest up, shoulders drawn back, abdominal muscles slightly tense, knees unlocked (if standing) and feet hip width apart.  Slowly retract your chin so your head slides back and your chin slightly lowers. Continue to look straight ahead.  You should feel a gentle stretch in the back of your head. Be certain not to feel an  aggressive stretch since this can cause headaches later.  Hold for 15-20 seconds. Repeat 2-3 times. Complete this exercise 1 time per day.  STRETCH - Cervical Side Bend   Stand or sit on a firm surface. Assume a good posture: chest up, shoulders drawn back, abdominal muscles slightly tense, knees unlocked (if standing) and feet hip width apart.  Without letting your nose or shoulders move, slowly tip your right / left ear to your shoulder until your feel a gentle stretch in the muscles on the opposite side of your neck.  Hold 15-20 seconds. Repeat 2-3 times. Complete this exercise 1-2 times per day.  STRETCH - Cervical Rotators   Stand or sit on a firm surface. Assume a good posture: chest up, shoulders drawn back, abdominal muscles slightly tense, knees unlocked (if standing) and feet hip width apart.  Keeping your eyes level with the ground, slowly turn your head until you feel a gentle stretch along the back and opposite side of your neck.  Hold 15-20 seconds. Repeat 2-3 times. Complete this exercise 1-2 times per day.  RANGE OF MOTION - Neck Circles   Stand or sit on a firm surface. Assume a good posture: chest up, shoulders drawn back, abdominal muscles slightly tense, knees unlocked (if standing) and feet hip width apart.  Gently roll your head down and around from the back of one shoulder to the back of the other. The motion should never be forced or painful.  Repeat the motion 10-20 times, or until you feel the neck muscles relax and loosen. Repeat  2-3 times. Complete the exercise 1-2 times per day. STRENGTHENING EXERCISES - Cervical Strain and Sprain These exercises may help you when beginning to rehabilitate your injury. They may resolve your symptoms with or without further involvement from your physician, physical therapist, or athletic trainer. While completing these exercises, remember:   Muscles can gain both the endurance and the strength needed for everyday  activities through controlled exercises.  Complete these exercises as instructed by your physician, physical therapist, or athletic trainer. Progress the resistance and repetitions only as guided.  You may experience muscle soreness or fatigue, but the pain or discomfort you are trying to eliminate should never worsen during these exercises. If this pain does worsen, stop and make certain you are following the directions exactly. If the pain is still present after adjustments, discontinue the exercise until you can discuss the trouble with your clinician.  STRENGTH - Cervical Flexors, Isometric  Face a wall, standing about 6 inches away. Place a small pillow, a ball about 6-8 inches in diameter, or a folded towel between your forehead and the wall.  Slightly tuck your chin and gently push your forehead into the soft object. Push only with mild to moderate intensity, building up tension gradually. Keep your jaw and forehead relaxed.  Hold 10 to 20 seconds. Keep your breathing relaxed.  Release the tension slowly. Relax your neck muscles completely before you start the next repetition. Repeat 2-3 times. Complete this exercise 1 time per day.  STRENGTH- Cervical Lateral Flexors, Isometric   Stand about 6 inches away from a wall. Place a small pillow, a ball about 6-8 inches in diameter, or a folded towel between the side of your head and the wall.  Slightly tuck your chin and gently tilt your head into the soft object. Push only with mild to moderate intensity, building up tension gradually. Keep your jaw and forehead relaxed.  Hold 10 to 20 seconds. Keep your breathing relaxed.  Release the tension slowly. Relax your neck muscles completely before you start the next repetition. Repeat 2-3 times. Complete this exercise 1 time per day.  STRENGTH - Cervical Extensors, Isometric   Stand about 6 inches away from a wall. Place a small pillow, a ball about 6-8 inches in diameter, or a folded  towel between the back of your head and the wall.  Slightly tuck your chin and gently tilt your head back into the soft object. Push only with mild to moderate intensity, building up tension gradually. Keep your jaw and forehead relaxed.  Hold 10 to 20 seconds. Keep your breathing relaxed.  Release the tension slowly. Relax your neck muscles completely before you start the next repetition. Repeat 2-3 times. Complete this exercise 1 time per day.  POSTURE AND BODY MECHANICS CONSIDERATIONS Keeping correct posture when sitting, standing or completing your activities will reduce the stress put on different body tissues, allowing injured tissues a chance to heal and limiting painful experiences. The following are general guidelines for improved posture. Your physician or physical therapist will provide you with any instructions specific to your needs. While reading these guidelines, remember:  The exercises prescribed by your provider will help you have the flexibility and strength to maintain correct postures.  The correct posture provides the optimal environment for your joints to work. All of your joints have less wear and tear when properly supported by a spine with good posture. This means you will experience a healthier, less painful body.  Correct posture must be practiced with  all of your activities, especially prolonged sitting and standing. Correct posture is as important when doing repetitive low-stress activities (typing) as it is when doing a single heavy-load activity (lifting).  PROLONGED STANDING WHILE SLIGHTLY LEANING FORWARD When completing a task that requires you to lean forward while standing in one place for a long time, place either foot up on a stationary 2- to 4-inch high object to help maintain the best posture. When both feet are on the ground, the low back tends to lose its slight inward curve. If this curve flattens (or becomes too large), then the back and your other  joints will experience too much stress, fatigue more quickly, and can cause pain.   RESTING POSITIONS Consider which positions are most painful for you when choosing a resting position. If you have pain with flexion-based activities (sitting, bending, stooping, squatting), choose a position that allows you to rest in a less flexed posture. You would want to avoid curling into a fetal position on your side. If your pain worsens with extension-based activities (prolonged standing, working overhead), avoid resting in an extended position such as sleeping on your stomach. Most people will find more comfort when they rest with their spine in a more neutral position, neither too rounded nor too arched. Lying on a non-sagging bed on your side with a pillow between your knees, or on your back with a pillow under your knees will often provide some relief. Keep in mind, being in any one position for a prolonged period of time, no matter how correct your posture, can still lead to stiffness.  WALKING Walk with an upright posture. Your ears, shoulders, and hips should all line up. OFFICE WORK When working at a desk, create an environment that supports good, upright posture. Without extra support, muscles fatigue and lead to excessive strain on joints and other tissues.  CHAIR:  A chair should be able to slide under your desk when your back makes contact with the back of the chair. This allows you to work closely.  The chair's height should allow your eyes to be level with the upper part of your monitor and your hands to be slightly lower than your elbows.  Body position: ? Your feet should make contact with the floor. If this is not possible, use a foot rest. ? Keep your ears over your shoulders. This will reduce stress on your neck and low back.

## 2018-05-21 NOTE — Progress Notes (Signed)
Pre visit review using our clinic review tool, if applicable. No additional management support is needed unless otherwise documented below in the visit note. 

## 2018-05-21 NOTE — Progress Notes (Signed)
Chief Complaint  Patient presents with  . Headache    Subjective: Patient is a 37 y.o. female here for HA.  +hx of migraines. Over past 2-3 mo she has had increased stress with her son having issues with feeding/speech and building a house. Has been grinding teeth at night lately. Does not chew a lot of gum. Also has b/l neck pain. Will have 3 migraines/mo and daily headaches that feel like tension otherwise. Assoc nausea that has been affecting her ability to parent and work. No neurologic s/s's. Does have a mild headache now.    ROS: Neuro: As noted in HPI  Past Medical History:  Diagnosis Date  . Abnormal Pap smear   . Headache(784.0)    migraines  . Hepatitis july 2005   unknown form of hepatitis, no current liver problems  . History of chicken pox   . History of D&C    D&E  . Nausea   . PONV (postoperative nausea and vomiting)   . Vaginal Pap smear, abnormal     Objective: BP 112/74 (BP Location: Left Arm, Patient Position: Sitting, Cuff Size: Large)   Pulse 78   Temp 98.4 F (36.9 C) (Oral)   Ht 5\' 9"  (1.753 m)   Wt 217 lb 6 oz (98.6 kg)   SpO2 97%   BMI 32.10 kg/m  General: Awake, appears stated age HEENT: MMM, EOMi MSK: +ttp over subocc triangle b/l, no paraspinal msk or ttp over TMJ. +R-ward jaw deviation Neuro: DTR's equal and symmetric, no cerebellar signs, no clonus Lungs: CTAB, no rales, wheezes or rhonchi. No accessory muscle use Psych: Age appropriate judgment and insight, normal affect and mood  Assessment and Plan: New daily persistent headache - Plan: venlafaxine XR (EFFEXOR-XR) 37.5 MG 24 hr capsule, ondansetron (ZOFRAN) 4 MG tablet, SUMAtriptan (IMITREX) 100 MG tablet  Refill Imitrex for migraines. Start SNRI for [facility]. Zofran for assoc nausea.  Stretches/exercises for neck. Wear bite guard for nighttime use to help with teeth grinding. Her dentist is aware.  F/u in 1 mo with Dr. Lorelei Pont.  The patient voiced understanding and agreement to the  plan.  [place], DO 05/21/18  10:00 AM

## 2018-06-20 NOTE — Progress Notes (Signed)
[facility] [place], [place], [place] [address] [phone]  Date:  06/24/2018   Name:  Shannon Hale   DOB:  1981/07/29   MRN:  [id number]  PCP:  Darreld Mclean, MD    Chief Complaint: Headache (1 month follow up, doing better)   History of Present Illness:  Shannon Hale is a 38 y.o. 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   She works weekends now which works better for her family, and she can keep her son out of daycare as he kept getting sick when he was in daycare last year In her free time she likes to read, take walks.   Her oldest is loving kindergarten and doing very well  Her mother has CLL and history of breast cancer dx in her mid 43s. She was advised to have her first mammo done at age 55s There is a family history of DM, colon cancer in her PGF, he was in his 93s or 85s at dx Her father had angina, but she is not sure if he actually has CAD She and her husband are working on getting into better shape- she has lost 13 lbs! They are changing their diet and trying to exercise   Seen here a month ago by Dr Viona Gilmore with headaches:  New daily persistent headache - Plan: venlafaxine XR (EFFEXOR-XR) 37.5 MG 24 hr capsule, ondansetron (ZOFRAN) 4 MG tablet, SUMAtriptan (IMITREX) 100 MG tablet Refill Imitrex for migraines. Start SNRI for [facility]. Zofran for assoc nausea.  Stretches/exercises for neck. Wear bite guard for nighttime use to help with teeth grinding. Her dentist is aware.  F/u in 1 mo with Dr. Lorelei Pont.  The patient voiced understanding and agreement to the plan.  Pap: per GYN,  [facility] Tdap: done during her most recent pregnancy  Flu:  This is done per hospital policy  She is doing a keto diet and taking effexor at 37.5 mg a day for her headaches- this seems to have really helped her  She has not needed to use imitrex She is also doing a keto diet which seems to be helping decrease HA and with weight loss- she is down another 7 lbs  She will use the mouthguard at night when she remembers   She is now having a HA twice a week- it was every day. Also milder She needs a refill of her zofran just to have on hand  The HA she is having now are not as severe. She may take some motrin and it helps. She is not having to use this as often  She does not get aura but she will phono and photophobia.  She may have nausea but does not generally vomit   She is still working mostly weekends   Her sons are in 1st grade and 22 yo   They are building a house- nearly done- so she thinks her stress level will be decreasing  She hopes that her HA will also decrease with her stress level Discussed going up on effexor but she prefers to stay at  current dose for now   Wt Readings from Last 3 Encounters:  06/24/18 210 lb (95.3 kg)  05/21/18 217 lb 6 oz (98.6 kg)  12/01/17 212 lb 3.2 oz (96.3 kg)    Patient Active Problem List   Diagnosis Date Noted  . New daily persistent headache 05/21/2018  . Obesity 09/25/2014  . Dermatitis 12/31/2013  . Cholelithiasis 02/29/2012  . BACK PAIN, THORACIC REGION 11/19/2010  . APHTHOUS STOMATITIS 08/10/2009  . MIGRAINE HEADACHE 09/23/2008    Past Medical History:  Diagnosis Date  . Abnormal Pap smear   . Headache(784.0)    migraines  . Hepatitis july 2005   unknown form of hepatitis, no current liver problems  . History of chicken pox   . History of D&C    D&E  . Nausea   . PONV (postoperative nausea and vomiting)   . Vaginal Pap smear, abnormal     Past Surgical History:  Procedure Laterality Date  . CHOLECYSTECTOMY N/A 12/01/2013    Procedure: LAPAROSCOPIC CHOLECYSTECTOMY ;  Surgeon: Harl Bowie, MD;  Location: [facility];  Service: General;  Laterality: N/A;  . DILATION AND EVACUATION N/A 07/14/2014   Procedure: DILATATION AND EVACUATION;  Surgeon: Marylynn Pearson, MD;  Location: [facility];  Service: Gynecology;  Laterality: N/A;  . SEPTOPLASTY    . TONSILLECTOMY    . WISDOM TOOTH EXTRACTION      Social History   Tobacco Use  . Smoking status: Former Smoker    Packs/day: 0.25    Years: 0.50    Pack years: 0.12    Types: Cigarettes    Last attempt to quit: 10/11/2002    Years since quitting: 15.7  . Smokeless tobacco: Never Used  Substance Use Topics  . Alcohol use: No  . Drug use: No    Family History  Problem Relation Age of Onset  . Cancer Mother        breast, leaukemia-Living  . Angina Father   . Hypertension Maternal Grandmother   . Diabetes Maternal Grandmother   . Depression Maternal Grandmother        anxiety also  . Cancer Maternal Grandmother        breast  . Diabetes Paternal Grandmother   . Heart disease Paternal Grandfather   . Cancer Paternal Grandfather        colon  . Cancer Maternal Aunt        cervical  . Seizures Brother        Resolved  . Diabetes Paternal Uncle   . Healthy Brother        x2  . Healthy Son        x1    No Known Allergies  Medication list has been reviewed and updated.  Current Outpatient Medications on File Prior to Visit  Medication Sig Dispense Refill  . Multiple Vitamin (MULTIVITAMIN) tablet Take 1 tablet by mouth daily.    . OCELLA 3-0.03 MG tablet Take 1 tablet by mouth daily.  2  . ondansetron (ZOFRAN) 4 MG tablet Take 1 tablet (4 mg total) by mouth every 8 (eight) hours as needed for nausea or vomiting. 30 tablet 0  . SUMAtriptan (IMITREX) 100 MG tablet Take 1 tablet (100 mg total) by mouth every 2 (two) hours as needed for migraine. May repeat in 2 hours if headache persists or recurs. 10 tablet 0  . venlafaxine XR (EFFEXOR-XR) 37.5 MG 24  hr capsule Take 1 capsule (37.5 mg total) by mouth daily with breakfast. 30  capsule 1   No current facility-administered medications on file prior to visit.     Review of Systems:  As per HPI- otherwise negative. She is NOT lactating at this time    Physical Examination: Vitals:   06/24/18 0924  BP: 126/80  Pulse: 64  Resp: 16  Temp: 98.6 F (37 C)  SpO2: 98%   Vitals:   06/24/18 0924  Weight: 210 lb (95.3 kg)  Height: 5\' 9"  (1.753 m)   Body mass index is 31.01 kg/m. Ideal Body Weight: Weight in (lb) to have BMI = 25: 168.9  GEN: WDWN, NAD, Non-toxic, A & O x 3, overweight, looks well  HEENT: Atraumatic, Normocephalic. Neck supple. No masses, No LAD. Ears and Nose: No external deformity. CV: RRR, No M/G/R. No JVD. No thrill. No extra heart sounds. PULM: CTA B, no wheezes, crackles, rhonchi. No retractions. No resp. distress. No accessory muscle use. EXTR: No c/c/e NEURO Normal gait.  PSYCH: Normally interactive. Conversant. Not depressed or anxious appearing.  Calm demeanor.    Assessment and Plan: Nausea - Plan: ondansetron (ZOFRAN-ODT) 4 MG disintegrating tablet  New daily persistent headache  HA are much better with effexor Continue to use this  zofran as needed for HA associated nausea She will let me know if she does desire to go up on her effexor dose    Signed Lamar Blinks, MD

## 2018-06-24 ENCOUNTER — Encounter: Payer: Self-pay | Admitting: Family Medicine

## 2018-06-24 ENCOUNTER — Ambulatory Visit: Payer: 59 | Admitting: Family Medicine

## 2018-06-24 VITALS — BP 126/80 | HR 64 | Temp 98.6°F | Resp 16 | Ht 69.0 in | Wt 210.0 lb

## 2018-06-24 DIAGNOSIS — R110 Nausea: Secondary | ICD-10-CM

## 2018-06-24 DIAGNOSIS — G4452 New daily persistent headache (NDPH): Secondary | ICD-10-CM

## 2018-06-24 MED ORDER — ONDANSETRON 4 MG PO TBDP: 4.0000 mg | ORAL_TABLET | Freq: Three times a day (TID) | ORAL | 1 refills | Status: DC | PRN

## 2018-06-24 MED FILL — VENLAFAXINE HCL ER 37.5 MG: 37.5 | 30 days supply | Qty: 30 | Fill #1

## 2018-06-24 MED FILL — ONDANSETRON ODT 4 MG TABLET: 4 | 10 days supply | Qty: 30 | Fill #0

## 2018-06-24 NOTE — Patient Instructions (Signed)
It was good to see you today- so glad that your headaches are better Let me know if you need to go up on your effexor dose zofran as needed

## 2018-08-21 ENCOUNTER — Telehealth: Payer: 59 | Admitting: Family

## 2018-08-21 DIAGNOSIS — B9789 Other viral agents as the cause of diseases classified elsewhere: Secondary | ICD-10-CM

## 2018-08-21 DIAGNOSIS — J329 Chronic sinusitis, unspecified: Secondary | ICD-10-CM | POA: Diagnosis not present

## 2018-08-21 MED ORDER — FLUTICASONE PROPIONATE 50 MCG/ACT NA SUSP: 1.0000 | Freq: Two times a day (BID) | NASAL | 6 refills | Status: DC

## 2018-08-21 MED FILL — OCELLA TABLET: 3-0.03 | 84 days supply | Qty: 84 | Fill #2

## 2018-08-21 MED FILL — FLUTICASONE PROP 50 MCG SPR: 50 | 30 days supply | Qty: 16 | Fill #0

## 2018-08-21 NOTE — Progress Notes (Signed)
Thank you for the details you included in the comment boxes. Those details are very helpful in determining the best course of treatment for you and help us to provide the best care.  We are sorry that you are not feeling well.  Here is how we plan to help!  Based on what you have shared with me it looks like you have sinusitis.  Sinusitis is inflammation and infection in the sinus cavities of the head.  Based on your presentation I believe you most likely have Acute Viral Sinusitis.This is an infection most likely caused by a virus. There is not specific treatment for viral sinusitis other than to help you with the symptoms until the infection runs its course.  You may use an oral decongestant such as Mucinex D or if you have glaucoma or high blood pressure use plain Mucinex. Saline nasal spray help and can safely be used as often as needed for congestion, I have prescribed: Fluticasone nasal spray two sprays in each nostril once a day  Some authorities believe that zinc sprays or the use of Echinacea may shorten the course of your symptoms.  Sinus infections are not as easily transmitted as other respiratory infection, however we still recommend that you avoid close contact with loved ones, especially the very young and elderly.  Remember to wash your hands thoroughly throughout the day as this is the number one way to prevent the spread of infection!  Home Care:  Only take medications as instructed by your medical team.  Do not take these medications with alcohol.  A steam or ultrasonic humidifier can help congestion.  You can place a towel over your head and breathe in the steam from hot water coming from a faucet.  Avoid close contacts especially the very young and the elderly.  Cover your mouth when you cough or sneeze.  Always remember to wash your hands.  Get Help Right Away If:  You develop worsening fever or sinus pain.  You develop a severe head ache or visual changes.  Your  symptoms persist after you have completed your treatment plan.  Make sure you  Understand these instructions.  Will watch your condition.  Will get help right away if you are not doing well or get worse.  Your e-visit answers were reviewed by a board certified advanced clinical practitioner to complete your personal care plan.  Depending on the condition, your plan could have included both over the counter or prescription medications.  If there is a problem please reply  once you have received a response from your provider.  Your safety is important to us.  If you have drug allergies check your prescription carefully.    You can use MyChart to ask questions about today's visit, request a non-urgent call back, or ask for a work or school excuse for 24 hours related to this e-Visit. If it has been greater than 24 hours you will need to follow up with your provider, or enter a new e-Visit to address those concerns.  You will get an e-mail in the next two days asking about your experience.  I hope that your e-visit has been valuable and will speed your recovery. Thank you for using e-visits.    

## 2018-08-31 ENCOUNTER — Telehealth: Payer: 59 | Admitting: Family

## 2018-08-31 ENCOUNTER — Encounter: Payer: Self-pay | Admitting: Family

## 2018-08-31 DIAGNOSIS — J329 Chronic sinusitis, unspecified: Secondary | ICD-10-CM | POA: Diagnosis not present

## 2018-08-31 DIAGNOSIS — B9689 Other specified bacterial agents as the cause of diseases classified elsewhere: Secondary | ICD-10-CM | POA: Diagnosis not present

## 2018-08-31 MED ORDER — AMOXICILLIN-POT CLAVULANATE 875-125 MG PO TABS: 1.0000 | ORAL_TABLET | Freq: Two times a day (BID) | ORAL | 0 refills | Status: AC

## 2018-08-31 MED ORDER — BENZONATATE 100 MG PO CAPS: 100.0000 mg | ORAL_CAPSULE | Freq: Three times a day (TID) | ORAL | 0 refills | Status: DC | PRN

## 2018-08-31 MED FILL — AMOX-CLAV 875-125 MG TABLET: 875-125 | 7 days supply | Qty: 14 | Fill #0

## 2018-08-31 MED FILL — BENZONATATE 100 MG CAP: 100 | 5 days supply | Qty: 30 | Fill #0

## 2018-08-31 NOTE — Progress Notes (Signed)
Thank you for the details you included in the comment boxes. Those details are very helpful in determining the best course of treatment for you and help us to provide the best care.  We are sorry that you are not feeling well.  Here is how we plan to help!  Based on what you have shared with me it looks like you have sinusitis.  Sinusitis is inflammation and infection in the sinus cavities of the head.  Based on your presentation I believe you most likely have Acute Bacterial Sinusitis.  This is an infection caused by bacteria and is treated with antibiotics. I have prescribed Augmentin 875mg/125mg one tablet twice daily with food, for 7 days. You may use an oral decongestant such as Mucinex D or if you have glaucoma or high blood pressure use plain Mucinex. Saline nasal spray help and can safely be used as often as needed for congestion.  If you develop worsening sinus pain, fever or notice severe headache and vision changes, or if symptoms are not better after completion of antibiotic, please schedule an appointment with a health care provider.    I have also sent Tessalon Perles 100mg, take 1-2 every 8 hours as needed for cough.    Sinus infections are not as easily transmitted as other respiratory infection, however we still recommend that you avoid close contact with loved ones, especially the very young and elderly.  Remember to wash your hands thoroughly throughout the day as this is the number one way to prevent the spread of infection!  Home Care:  Only take medications as instructed by your medical team.  Complete the entire course of an antibiotic.  Do not take these medications with alcohol.  A steam or ultrasonic humidifier can help congestion.  You can place a towel over your head and breathe in the steam from hot water coming from a faucet.  Avoid close contacts especially the very young and the elderly.  Cover your mouth when you cough or sneeze.  Always remember to wash your  hands.  Get Help Right Away If:  You develop worsening fever or sinus pain.  You develop a severe head ache or visual changes.  Your symptoms persist after you have completed your treatment plan.  Make sure you  Understand these instructions.  Will watch your condition.  Will get help right away if you are not doing well or get worse.  Your e-visit answers were reviewed by a board certified advanced clinical practitioner to complete your personal care plan.  Depending on the condition, your plan could have included both over the counter or prescription medications.  If there is a problem please reply  once you have received a response from your provider.  Your safety is important to us.  If you have drug allergies check your prescription carefully.    You can use MyChart to ask questions about today's visit, request a non-urgent call back, or ask for a work or school excuse for 24 hours related to this e-Visit. If it has been greater than 24 hours you will need to follow up with your provider, or enter a new e-Visit to address those concerns.  You will get an e-mail in the next two days asking about your experience.  I hope that your e-visit has been valuable and will speed your recovery. Thank you for using e-visits.     

## 2018-11-27 MED FILL — OCELLA TABLET: 3-0.03 | 84 days supply | Qty: 84 | Fill #3

## 2019-02-15 MED FILL — OCELLA TABLET: 3-0.03 | 28 days supply | Qty: 28 | Fill #0

## 2019-03-19 MED FILL — OCELLA TABLET: 3-0.03 | 28 days supply | Qty: 28 | Fill #1

## 2019-04-27 MED FILL — OCELLA TABLET: 3-0.03 | 56 days supply | Qty: 56 | Fill #0

## 2019-07-01 MED FILL — OCELLA TABLET: 3-0.03 | 56 days supply | Qty: 56 | Fill #0

## 2019-07-06 DIAGNOSIS — Z6831 Body mass index (BMI) 31.0-31.9, adult: Secondary | ICD-10-CM | POA: Diagnosis not present

## 2019-07-06 DIAGNOSIS — Z01419 Encounter for gynecological examination (general) (routine) without abnormal findings: Secondary | ICD-10-CM | POA: Diagnosis not present

## 2019-07-06 DIAGNOSIS — R87610 Atypical squamous cells of undetermined significance on cytologic smear of cervix (ASC-US): Secondary | ICD-10-CM | POA: Diagnosis not present

## 2020-06-26 DIAGNOSIS — R519 Headache, unspecified: Secondary | ICD-10-CM | POA: Insufficient documentation

## 2020-06-26 DIAGNOSIS — R21 Rash and other nonspecific skin eruption: Secondary | ICD-10-CM | POA: Insufficient documentation

## 2020-06-27 ENCOUNTER — Other Ambulatory Visit: Payer: Self-pay

## 2020-06-27 ENCOUNTER — Ambulatory Visit (INDEPENDENT_AMBULATORY_CARE_PROVIDER_SITE_OTHER): Payer: 59

## 2020-06-27 ENCOUNTER — Ambulatory Visit: Payer: 59 | Admitting: Podiatry

## 2020-06-27 ENCOUNTER — Encounter: Payer: Self-pay | Admitting: Podiatry

## 2020-06-27 DIAGNOSIS — M722 Plantar fascial fibromatosis: Secondary | ICD-10-CM

## 2020-06-28 ENCOUNTER — Encounter: Payer: Self-pay | Admitting: Podiatry

## 2020-06-28 NOTE — Progress Notes (Signed)
Subjective:  Patient ID: Shannon Hale, female    DOB: [birth year],  MRN: [id number]  Chief Complaint  Patient presents with  . Foot Pain    Patient presents today for bilat feet/arch pain x 2 yrs    39 y.o. female presents with the above complaint. Patient presents with bilateral heel pain and arch pain that has been going for 2 years has progressive gotten worse. Patient states that is painful to touch. She would like to know what other treatment options available. She has been ambulating with his feet for many years she constantly walks around with them and it hurts with getting out of sitting or sleeping position. She denies any other acute complaints. She has not been seen anyone else prior to seeing me. She was seen by Liliane Channel for his son who recommended her to be follow-up with me.   Review of Systems: Negative except as noted in the HPI. Denies N/V/F/Ch.  Past Medical History:  Diagnosis Date  . Abnormal Pap smear   . Headache(784.0)    migraines  . Hepatitis july 2005   unknown form of hepatitis, no current liver problems  . History of chicken pox   . History of D&C    D&E  . Nausea   . PONV (postoperative nausea and vomiting)   . Vaginal Pap smear, abnormal     Current Outpatient Medications:  .  OCELLA 3-0.03 MG tablet, Take 1 tablet by mouth daily., Disp: , Rfl: 2 .  ondansetron (ZOFRAN) 4 MG tablet, Take 1 tablet (4 mg total) by mouth every 8 (eight) hours as needed for nausea or vomiting., Disp: 30 tablet, Rfl: 0 .  SUMAtriptan (IMITREX) 100 MG tablet, Take 1 tablet (100 mg total) by mouth every 2 (two) hours as needed for migraine. May repeat in 2 hours if headache persists or recurs., Disp: 10 tablet, Rfl: 0  Social History   Tobacco Use  Smoking Status Former Smoker  . Packs/day: 0.25  . Years: 0.50  . Pack years: 0.12  . Types: Cigarettes  . Quit date: 10/11/2002  . Years since quitting: 17.7  Smokeless Tobacco Never Used    No Known  Allergies Objective:  There were no vitals filed for this visit. There is no height or weight on file to calculate BMI. Constitutional Well developed. Well nourished.  Vascular Dorsalis pedis pulses palpable bilaterally. Posterior tibial pulses palpable bilaterally. Capillary refill normal to all digits.  No cyanosis or clubbing noted. Pedal hair growth normal.  Neurologic Normal speech. Oriented to person, place, and time. Epicritic sensation to light touch grossly present bilaterally.  Dermatologic Nails well groomed and normal in appearance. No open wounds. No skin lesions.  Orthopedic: Normal joint ROM without pain or crepitus bilaterally. No visible deformities. Tender to palpation at the calcaneal tuber bilaterally. No pain with calcaneal squeeze bilaterally. Ankle ROM full range of motion bilaterally. Silfverskiold Test: negative bilaterally.   Radiographs: Taken and reviewed. No acute fractures or dislocations. No evidence of stress fracture.  Plantar heel spur absent. Posterior heel spur absent.   Assessment:   1. Plantar fasciitis of right foot   2. Plantar fasciitis of left foot    Plan:  Patient was evaluated and treated and all questions answered.  Plantar Fasciitis, bilaterally - XR reviewed as above.  - Educated on icing and stretching. Instructions given.  - Injection delivered to the plantar fascia as below. - DME: Plantar Fascial Brace - Pharmacologic management: None  Procedure: Injection Tendon/Ligament Location:  Bilateral plantar fascia at the glabrous junction; medial approach. Skin Prep: alcohol Injectate: 0.5 cc 0.5% marcaine plain, 0.5 cc of 1% Lidocaine, 0.5 cc kenalog 10. Disposition: Patient tolerated procedure well. Injection site dressed with a band-aid.  No follow-ups on file.

## 2020-07-25 ENCOUNTER — Encounter: Payer: Self-pay | Admitting: Podiatry

## 2020-07-25 ENCOUNTER — Other Ambulatory Visit: Payer: Self-pay

## 2020-07-25 ENCOUNTER — Ambulatory Visit: Payer: 59 | Admitting: Podiatry

## 2020-07-25 DIAGNOSIS — Q666 Other congenital valgus deformities of feet: Secondary | ICD-10-CM

## 2020-07-25 DIAGNOSIS — M722 Plantar fascial fibromatosis: Secondary | ICD-10-CM

## 2020-07-26 ENCOUNTER — Encounter: Payer: Self-pay | Admitting: Podiatry

## 2020-07-26 NOTE — Progress Notes (Signed)
Subjective:  Patient ID: Shannon Hale, female    DOB: [birth year],  MRN: [id number]  Chief Complaint  Patient presents with  . Plantar Fasciitis    "They are better than before, but not at 100% yet"    39 y.o. female presents with the above complaint.  Patient presents with follow-up of bilateral plantar fasciitis.  Patient states she is doing much better but not 100% yet.  She states that she does not want another injection.  She would like to discuss other treatment options.  She states the injection in the past helped a lot and have brought down her pain completely.  She has been wearing better shoes.  She denies any other acute complaints  Review of Systems: Negative except as noted in the HPI. Denies N/V/F/Ch.  Past Medical History:  Diagnosis Date  . Abnormal Pap smear   . Headache(784.0)    migraines  . Hepatitis july 2005   unknown form of hepatitis, no current liver problems  . History of chicken pox   . History of D&C    D&E  . Nausea   . PONV (postoperative nausea and vomiting)   . Vaginal Pap smear, abnormal     Current Outpatient Medications:  .  OCELLA 3-0.03 MG tablet, Take 1 tablet by mouth daily., Disp: , Rfl: 2 .  ondansetron (ZOFRAN) 4 MG tablet, Take 1 tablet (4 mg total) by mouth every 8 (eight) hours as needed for nausea or vomiting., Disp: 30 tablet, Rfl: 0 .  SUMAtriptan (IMITREX) 100 MG tablet, Take 1 tablet (100 mg total) by mouth every 2 (two) hours as needed for migraine. May repeat in 2 hours if headache persists or recurs., Disp: 10 tablet, Rfl: 0  Social History   Tobacco Use  Smoking Status Former Smoker  . Packs/day: 0.25  . Years: 0.50  . Pack years: 0.12  . Types: Cigarettes  . Quit date: 10/11/2002  . Years since quitting: 17.8  Smokeless Tobacco Never Used    No Known Allergies Objective:  There were no vitals filed for this visit. There is no height or weight on file to calculate BMI. Constitutional Well developed. Well  nourished.  Vascular Dorsalis pedis pulses palpable bilaterally. Posterior tibial pulses palpable bilaterally. Capillary refill normal to all digits.  No cyanosis or clubbing noted. Pedal hair growth normal.  Neurologic Normal speech. Oriented to person, place, and time. Epicritic sensation to light touch grossly present bilaterally.  Dermatologic Nails well groomed and normal in appearance. No open wounds. No skin lesions.  Orthopedic: Normal joint ROM without pain or crepitus bilaterally. No visible deformities. Mild tender to palpation at the calcaneal tuber bilaterally. No pain with calcaneal squeeze bilaterally. Ankle ROM full range of motion bilaterally. Silfverskiold Test: negative bilaterally.   Radiographs: Taken and reviewed. No acute fractures or dislocations. No evidence of stress fracture.  Plantar heel spur absent. Posterior heel spur absent.   Assessment:   1. Pes planovalgus   2. Plantar fasciitis of right foot   3. Plantar fasciitis of left foot    Plan:  Patient was evaluated and treated and all questions answered.  Plantar Fasciitis, bilaterally - XR reviewed as above.  - Educated on icing and stretching. Instructions given.  -Hold off on the second steroid injection and if there is no improvement in the next clinical visit we will plan on giving it at that time - DME: Continue wearing plantar fascial brace - Pharmacologic management: None  Semiflexible pes planovalgus -I  explained to patient the etiology of pes planovalgus versus treatment options were discussed.  I believe patient will benefit from custom-made orthotics to help control-motion and support the arch of the foot and take the stress off of the plantar fascia.  Patient agrees with the plan would like to get orthotics. -She will be scheduled see rec for custom-made orthotics  Procedure: Injection Tendon/Ligament Location: Bilateral plantar fascia at the glabrous junction; medial approach. Skin  Prep: alcohol Injectate: 0.5 cc 0.5% marcaine plain, 0.5 cc of 1% Lidocaine, 0.5 cc kenalog 10. Disposition: Patient tolerated procedure well. Injection site dressed with a band-aid.  No follow-ups on file.

## 2020-08-16 ENCOUNTER — Other Ambulatory Visit: Payer: Self-pay

## 2020-08-16 ENCOUNTER — Ambulatory Visit (INDEPENDENT_AMBULATORY_CARE_PROVIDER_SITE_OTHER): Payer: 59 | Admitting: Orthotics

## 2020-08-16 DIAGNOSIS — Q666 Other congenital valgus deformities of feet: Secondary | ICD-10-CM

## 2020-08-16 DIAGNOSIS — M722 Plantar fascial fibromatosis: Secondary | ICD-10-CM

## 2020-08-16 NOTE — Progress Notes (Signed)
Patient is being seen today for f/o to address congential pes planus/pes planovalgus. Patient is active youth and demonstrates over pronation in gait, prominent medially shifted talus, and collapse of medial column.  Goals are RF stability, longitudinal arch support, decrease in pronation, and ease of discomfort in mobility related activities.   

## 2020-09-20 ENCOUNTER — Other Ambulatory Visit: Payer: Self-pay

## 2020-09-20 ENCOUNTER — Ambulatory Visit: Payer: 59 | Admitting: Orthotics

## 2020-09-20 DIAGNOSIS — Q666 Other congenital valgus deformities of feet: Secondary | ICD-10-CM

## 2020-09-20 DIAGNOSIS — M722 Plantar fascial fibromatosis: Secondary | ICD-10-CM

## 2020-09-20 NOTE — Progress Notes (Signed)
Patient picked up f/o and was pleased with fit, comfort, and function.  Worked well with footwear.  Told of rbeak in period and how to report any issues.  

## 2020-09-26 ENCOUNTER — Ambulatory Visit: Payer: 59 | Admitting: Podiatry

## 2020-09-27 ENCOUNTER — Other Ambulatory Visit: Payer: Self-pay | Admitting: Obstetrics and Gynecology

## 2020-10-03 ENCOUNTER — Ambulatory Visit: Payer: 59 | Admitting: Podiatry

## 2020-10-03 ENCOUNTER — Encounter: Payer: Self-pay | Admitting: Podiatry

## 2020-10-03 ENCOUNTER — Other Ambulatory Visit: Payer: Self-pay

## 2020-10-03 DIAGNOSIS — Q666 Other congenital valgus deformities of feet: Secondary | ICD-10-CM

## 2020-10-03 DIAGNOSIS — M722 Plantar fascial fibromatosis: Secondary | ICD-10-CM | POA: Diagnosis not present

## 2020-10-03 NOTE — Progress Notes (Signed)
Subjective:  Patient ID: Shannon Hale, female    DOB: 1981/06/17,  MRN: [id number]  Chief Complaint  Patient presents with  . Foot Pain    "its doing great now"    40 y.o. female presents with the above complaint.  Patient presents with follow-up of bilateral plantar fasciitis.  She is doing much better she is 100%.  She has been wearing her orthotics made her shoe gear modification wearing bracing.  She denies any other acute complaints.  Review of Systems: Negative except as noted in the HPI. Denies N/V/F/Ch.  Past Medical History:  Diagnosis Date  . Abnormal Pap smear   . Headache(784.0)    migraines  . Hepatitis july 2005   unknown form of hepatitis, no current liver problems  . History of chicken pox   . History of D&C    D&E  . Nausea   . PONV (postoperative nausea and vomiting)   . Vaginal Pap smear, abnormal     Current Outpatient Medications:  .  OCELLA 3-0.03 MG tablet, Take 1 tablet by mouth daily., Disp: , Rfl: 2 .  ondansetron (ZOFRAN) 4 MG tablet, Take 1 tablet (4 mg total) by mouth every 8 (eight) hours as needed for nausea or vomiting., Disp: 30 tablet, Rfl: 0 .  SUMAtriptan (IMITREX) 100 MG tablet, Take 1 tablet (100 mg total) by mouth every 2 (two) hours as needed for migraine. May repeat in 2 hours if headache persists or recurs., Disp: 10 tablet, Rfl: 0  Social History   Tobacco Use  Smoking Status Former Smoker  . Packs/day: 0.25  . Years: 0.50  . Pack years: 0.12  . Types: Cigarettes  . Quit date: 10/11/2002  . Years since quitting: 17.9  Smokeless Tobacco Never Used    No Known Allergies Objective:  There were no vitals filed for this visit. There is no height or weight on file to calculate BMI. Constitutional Well developed. Well nourished.  Vascular Dorsalis pedis pulses palpable bilaterally. Posterior tibial pulses palpable bilaterally. Capillary refill normal to all digits.  No cyanosis or clubbing noted. Pedal hair growth  normal.  Neurologic Normal speech. Oriented to person, place, and time. Epicritic sensation to light touch grossly present bilaterally.  Dermatologic Nails well groomed and normal in appearance. No open wounds. No skin lesions.  Orthopedic: Normal joint ROM without pain or crepitus bilaterally. No visible deformities. No further tender to palpation at the calcaneal tuber bilaterally. No pain with calcaneal squeeze bilaterally. Ankle ROM full range of motion bilaterally. Silfverskiold Test: negative bilaterally.   Radiographs: Taken and reviewed. No acute fractures or dislocations. No evidence of stress fracture.  Plantar heel spur absent. Posterior heel spur absent.   Assessment:   1. Plantar fasciitis of right foot   2. Plantar fasciitis of left foot   3. Pes planovalgus    Plan:  Patient was evaluated and treated and all questions answered.  Plantar Fasciitis, bilaterally -Clinically healed with plantar fascial braces and injection.  She is transitioning orthotics well.  Her pain is about 100% improved.  If any foot and ankle issues arise in the future I will asked her to come back and see me.  I encouraged shoe gear modification which she is doing well with.  Semiflexible pes planovalgus -I explained to patient the etiology of pes planovalgus versus treatment options were discussed.  I believe patient will benefit from custom-made orthotics to help control-motion and support the arch of the foot and take the stress off  of the plantar fascia.  Patient agrees with the plan would like to get orthotics. -She is ambulating orthotics without any acute complaints.  They are functioning well.    No follow-ups on file.

## 2020-12-11 ENCOUNTER — Other Ambulatory Visit: Payer: Self-pay | Admitting: Obstetrics and Gynecology

## 2020-12-12 ENCOUNTER — Other Ambulatory Visit: Payer: Self-pay

## 2020-12-15 ENCOUNTER — Other Ambulatory Visit: Payer: Self-pay

## 2020-12-19 ENCOUNTER — Other Ambulatory Visit: Payer: Self-pay

## 2020-12-22 ENCOUNTER — Other Ambulatory Visit: Payer: Self-pay

## 2020-12-22 ENCOUNTER — Other Ambulatory Visit (HOSPITAL_BASED_OUTPATIENT_CLINIC_OR_DEPARTMENT_OTHER): Payer: Self-pay

## 2020-12-22 MED ORDER — DROSPIRENONE-ETHINYL ESTRADIOL 3-0.03 MG PO TABS
1.0000 | ORAL_TABLET | Freq: Every day | ORAL | 0 refills | Status: DC
Start: 2020-12-22 — End: 2021-03-22
  Filled 2020-12-22: qty 56, 56d supply, fill #0

## 2020-12-29 ENCOUNTER — Other Ambulatory Visit (HOSPITAL_BASED_OUTPATIENT_CLINIC_OR_DEPARTMENT_OTHER): Payer: Self-pay

## 2020-12-29 ENCOUNTER — Other Ambulatory Visit: Payer: Self-pay

## 2021-01-01 ENCOUNTER — Other Ambulatory Visit: Payer: Self-pay

## 2021-01-01 MED ORDER — DROSPIRENONE-ETHINYL ESTRADIOL 3-0.03 MG PO TABS
1.0000 | ORAL_TABLET | Freq: Every day | ORAL | 0 refills | Status: DC
Start: 2021-01-01 — End: 2021-03-22
  Filled 2021-01-01: qty 28, 28d supply, fill #0

## 2021-01-01 MED ORDER — DROSPIRENONE-ETHINYL ESTRADIOL 3-0.03 MG PO TABS
ORAL_TABLET | ORAL | 0 refills | Status: DC
Start: 2021-01-01 — End: 2021-01-01
  Filled 2021-01-01: qty 28, 28d supply, fill #0

## 2021-01-24 ENCOUNTER — Other Ambulatory Visit (HOSPITAL_BASED_OUTPATIENT_CLINIC_OR_DEPARTMENT_OTHER): Payer: Self-pay

## 2021-01-24 ENCOUNTER — Other Ambulatory Visit: Payer: Self-pay | Admitting: Obstetrics and Gynecology

## 2021-01-24 DIAGNOSIS — N6310 Unspecified lump in the right breast, unspecified quadrant: Secondary | ICD-10-CM | POA: Diagnosis not present

## 2021-01-24 DIAGNOSIS — Z309 Encounter for contraceptive management, unspecified: Secondary | ICD-10-CM | POA: Diagnosis not present

## 2021-01-24 DIAGNOSIS — Z01419 Encounter for gynecological examination (general) (routine) without abnormal findings: Secondary | ICD-10-CM | POA: Diagnosis not present

## 2021-01-24 DIAGNOSIS — Z6832 Body mass index (BMI) 32.0-32.9, adult: Secondary | ICD-10-CM | POA: Diagnosis not present

## 2021-01-24 MED ORDER — DROSPIRENONE-ETHINYL ESTRADIOL 3-0.03 MG PO TABS
1.0000 | ORAL_TABLET | Freq: Every day | ORAL | 4 refills | Status: DC
Start: 2021-01-24 — End: 2021-03-22
  Filled 2021-01-24: qty 84, 84d supply, fill #0

## 2021-01-31 ENCOUNTER — Other Ambulatory Visit (HOSPITAL_BASED_OUTPATIENT_CLINIC_OR_DEPARTMENT_OTHER): Payer: Self-pay

## 2021-03-06 ENCOUNTER — Ambulatory Visit
Admission: RE | Admit: 2021-03-06 | Discharge: 2021-03-06 | Disposition: A | Discharge status: Home / Self Care | Payer: 59 | Source: Ambulatory Visit | Attending: Obstetrics and Gynecology | Admitting: Obstetrics and Gynecology

## 2021-03-06 ENCOUNTER — Other Ambulatory Visit: Payer: Self-pay

## 2021-03-06 ENCOUNTER — Other Ambulatory Visit: Payer: Self-pay | Admitting: Obstetrics and Gynecology

## 2021-03-06 DIAGNOSIS — N6310 Unspecified lump in the right breast, unspecified quadrant: Secondary | ICD-10-CM

## 2021-03-06 DIAGNOSIS — Z803 Family history of malignant neoplasm of breast: Secondary | ICD-10-CM | POA: Diagnosis not present

## 2021-03-06 DIAGNOSIS — R922 Inconclusive mammogram: Secondary | ICD-10-CM | POA: Diagnosis not present

## 2021-03-06 DIAGNOSIS — R599 Enlarged lymph nodes, unspecified: Secondary | ICD-10-CM

## 2021-03-06 DIAGNOSIS — N6311 Unspecified lump in the right breast, upper outer quadrant: Secondary | ICD-10-CM | POA: Diagnosis not present

## 2021-03-14 ENCOUNTER — Ambulatory Visit
Admission: RE | Admit: 2021-03-14 | Discharge: 2021-03-14 | Disposition: A | Discharge status: Home / Self Care | Payer: 59 | Source: Ambulatory Visit | Attending: Obstetrics and Gynecology | Admitting: Obstetrics and Gynecology

## 2021-03-14 ENCOUNTER — Other Ambulatory Visit: Payer: Self-pay

## 2021-03-14 DIAGNOSIS — N6310 Unspecified lump in the right breast, unspecified quadrant: Secondary | ICD-10-CM

## 2021-03-14 DIAGNOSIS — R599 Enlarged lymph nodes, unspecified: Secondary | ICD-10-CM

## 2021-03-14 DIAGNOSIS — Z170 Estrogen receptor positive status [ER+]: Secondary | ICD-10-CM | POA: Diagnosis not present

## 2021-03-14 DIAGNOSIS — C773 Secondary and unspecified malignant neoplasm of axilla and upper limb lymph nodes: Secondary | ICD-10-CM | POA: Diagnosis not present

## 2021-03-14 DIAGNOSIS — C50111 Malignant neoplasm of central portion of right female breast: Secondary | ICD-10-CM | POA: Diagnosis not present

## 2021-03-14 DIAGNOSIS — R590 Localized enlarged lymph nodes: Secondary | ICD-10-CM | POA: Diagnosis not present

## 2021-03-14 DIAGNOSIS — N6311 Unspecified lump in the right breast, upper outer quadrant: Secondary | ICD-10-CM | POA: Diagnosis not present

## 2021-03-19 ENCOUNTER — Other Ambulatory Visit: Payer: Self-pay | Admitting: General Surgery

## 2021-03-19 ENCOUNTER — Other Ambulatory Visit: Payer: Self-pay | Admitting: Obstetrics and Gynecology

## 2021-03-19 DIAGNOSIS — Z170 Estrogen receptor positive status [ER+]: Secondary | ICD-10-CM

## 2021-03-19 DIAGNOSIS — C773 Secondary and unspecified malignant neoplasm of axilla and upper limb lymph nodes: Secondary | ICD-10-CM | POA: Diagnosis not present

## 2021-03-19 DIAGNOSIS — C50911 Malignant neoplasm of unspecified site of right female breast: Secondary | ICD-10-CM | POA: Diagnosis not present

## 2021-03-19 DIAGNOSIS — C50411 Malignant neoplasm of upper-outer quadrant of right female breast: Secondary | ICD-10-CM

## 2021-03-19 DIAGNOSIS — Z853 Personal history of malignant neoplasm of breast: Secondary | ICD-10-CM

## 2021-03-20 ENCOUNTER — Other Ambulatory Visit: Payer: Self-pay | Admitting: General Surgery

## 2021-03-20 ENCOUNTER — Other Ambulatory Visit
Admission: RE | Admit: 2021-03-20 | Discharge: 2021-03-20 | Disposition: A | Discharge status: Home / Self Care | Payer: 59 | Attending: General Surgery | Admitting: General Surgery

## 2021-03-20 DIAGNOSIS — Z170 Estrogen receptor positive status [ER+]: Secondary | ICD-10-CM | POA: Insufficient documentation

## 2021-03-20 DIAGNOSIS — C50411 Malignant neoplasm of upper-outer quadrant of right female breast: Secondary | ICD-10-CM | POA: Diagnosis not present

## 2021-03-20 LAB — COMPREHENSIVE METABOLIC PANEL
ALT: 15 U/L (ref 0–44)
AST: 17 U/L (ref 15–41)
Albumin: 4.1 g/dL (ref 3.5–5.0)
Alkaline Phosphatase: 61 U/L (ref 38–126)
Anion gap: 8 (ref 5–15)
BUN: 8 mg/dL (ref 6–20)
CO2: 26 mmol/L (ref 22–32)
Calcium: 10 mg/dL (ref 8.9–10.3)
Chloride: 107 mmol/L (ref 98–111)
Creatinine, Ser: 0.75 mg/dL (ref 0.44–1.00)
GFR, Estimated: >60 mL/min (ref >60)
Glucose, Bld: 106 mg/dL — ABNORMAL HIGH (ref 70–99)
Potassium: 4 mmol/L (ref 3.5–5.1)
Sodium: 141 mmol/L (ref 135–145)
Total Bilirubin: 0.5 mg/dL (ref 0.3–1.2)
Total Protein: 7.6 g/dL (ref 6.5–8.1)

## 2021-03-20 LAB — CBC WITH DIFFERENTIAL/PLATELET
Abs Immature Granulocytes: 0.02 10*3/uL (ref 0.00–0.07)
Basophils Absolute: 0 10*3/uL (ref 0.0–0.1)
Basophils Relative: 1 %
Eosinophils Absolute: 0.1 10*3/uL (ref 0.0–0.5)
Eosinophils Relative: 2 %
HCT: 37.6 % (ref 36.0–46.0)
Hemoglobin: 12.9 g/dL (ref 12.0–15.0)
Immature Granulocytes: 0 %
Lymphocytes Relative: 31 %
Lymphs Abs: 1.8 10*3/uL (ref 0.7–4.0)
MCH: 28.7 pg (ref 26.0–34.0)
MCHC: 34.3 g/dL (ref 30.0–36.0)
MCV: 83.6 fL (ref 80.0–100.0)
Monocytes Absolute: 0.5 10*3/uL (ref 0.1–1.0)
Monocytes Relative: 9 %
Neutro Abs: 3.4 10*3/uL (ref 1.7–7.7)
Neutrophils Relative %: 57 %
Platelets: 283 10*3/uL (ref 150–400)
RBC: 4.5 MIL/uL (ref 3.87–5.11)
RDW: 12.2 % (ref 11.5–15.5)
WBC: 5.9 10*3/uL (ref 4.0–10.5)
nRBC: 0 % (ref 0.0–0.2)

## 2021-03-21 LAB — CANCER ANTIGEN 27.29: CA 27.29: 29.3 U/mL (ref 0.0–38.6)

## 2021-03-22 ENCOUNTER — Encounter: Payer: Self-pay | Admitting: Internal Medicine

## 2021-03-22 ENCOUNTER — Other Ambulatory Visit: Payer: Self-pay

## 2021-03-22 ENCOUNTER — Inpatient Hospital Stay: Payer: 59

## 2021-03-22 ENCOUNTER — Inpatient Hospital Stay: Payer: 59 | Attending: Internal Medicine | Admitting: Internal Medicine

## 2021-03-22 DIAGNOSIS — Z806 Family history of leukemia: Secondary | ICD-10-CM | POA: Diagnosis not present

## 2021-03-22 DIAGNOSIS — Z800 Family history of malignant neoplasm of digestive organs: Secondary | ICD-10-CM | POA: Diagnosis not present

## 2021-03-22 DIAGNOSIS — Z87891 Personal history of nicotine dependence: Secondary | ICD-10-CM | POA: Insufficient documentation

## 2021-03-22 DIAGNOSIS — C50811 Malignant neoplasm of overlapping sites of right female breast: Secondary | ICD-10-CM | POA: Diagnosis not present

## 2021-03-22 DIAGNOSIS — Z803 Family history of malignant neoplasm of breast: Secondary | ICD-10-CM | POA: Insufficient documentation

## 2021-03-22 DIAGNOSIS — Z170 Estrogen receptor positive status [ER+]: Secondary | ICD-10-CM | POA: Diagnosis not present

## 2021-03-22 NOTE — Progress Notes (Signed)
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  female female with no prior history of breast cancer/or malignancies has been referred to [place] for further evaluation recommendations for new diagnosis of breast cancer.  Patient noted to have a breast lump in May 2022 during the physical exam with gynecology.  This led to further mammogram which was abnormal/ultrasound biopsy as summarized above.  Patient evaluated by Dr. Bary Castilla.  Patient here to discuss chemotherapy options.   Family history of breast cancer: mom [tested06-Mar-2009- CLL/Lymphoma-died ]; maternal grandam; mat great  Used OCP: ocella/yaz..   Used estrogen and progesterone therapy: none   History of Radiation to the chest: none Number of pregnancies: 2 boys- 5 & 10 years.    Review of Systems  Constitutional:  Negative for chills, diaphoresis, fever, malaise/fatigue and weight loss.  HENT:  Negative for nosebleeds and sore throat.   Eyes:  Negative for double vision.  Respiratory:  Negative for cough, hemoptysis, sputum production, shortness of breath and wheezing.   Cardiovascular:  Negative for chest pain, palpitations, orthopnea and leg swelling.  Gastrointestinal:  Negative for  abdominal pain, blood in stool, constipation, diarrhea, heartburn, melena, nausea and vomiting.  Genitourinary:  Negative for dysuria, frequency and urgency.  Musculoskeletal:  Negative for back pain and joint pain.  Skin: Negative.  Negative for itching and rash.  Neurological:  Negative for dizziness, tingling, focal weakness, weakness and headaches.  Endo/Heme/Allergies:  Does not bruise/bleed easily.  Psychiatric/Behavioral:  Negative for depression. The patient is not nervous/anxious and does not have insomnia.     MEDICAL HISTORY:  Past Medical History:  Diagnosis Date   Abnormal Pap smear    Headache(784.0)    migraines   Hepatitis 03/2004   unknown form of hepatitis, no current liver problems   History of chicken pox    History of D&C    D&E   Migraine    Nausea    PONV (postoperative nausea and vomiting)    Vaginal Pap smear, abnormal     SURGICAL HISTORY: Past Surgical History:  Procedure Laterality Date   CHOLECYSTECTOMY N/A 12/01/2013   Procedure: LAPAROSCOPIC CHOLECYSTECTOMY ;  Surgeon: Harl Bowie, MD;  Location: [facility];  Service: General;  Laterality: N/A;   DILATION AND EVACUATION N/A 07/14/2014   Procedure: DILATATION AND EVACUATION;  Surgeon: Marylynn Pearson, MD;  Location: [facility];  Service: Gynecology;  Laterality: N/A;   SEPTOPLASTY     TONSILLECTOMY     WISDOM TOOTH EXTRACTION      SOCIAL HISTORY: Social History   Socioeconomic History   Marital status: Married    Spouse name: Not on file   Number of children: Not on file   Years of education: Not on file   Highest education level: Not on file  Occupational History  Not on file  Tobacco Use   Smoking status: Former    Packs/day: 0.25    Years: 0.50    Pack years: 0.13    Types: Cigarettes    Quit date: 10/11/2002    Years since quitting: 18.4   Smokeless tobacco: Never  Substance and Sexual Activity   Alcohol use: No   Drug use: No   Sexual activity: Yes  Other Topics Concern    Not on file  Social History Narrative   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  Patient husband works at vascular department at [employer]. no smoking; rare alcohol.  Lives in [place].    Social Determinants of Health   Financial Resource Strain: Not on file  Food Insecurity: Not on file  Transportation Needs: Not on file  Physical Activity: Not on file  Stress: Not on file  Social Connections: Not on file  Intimate Partner Violence: Not on file    FAMILY HISTORY: Family History  Problem Relation Age of Onset   Cancer Mother        breast, leaukemia-Living   Angina Father    Hypertension Maternal Grandmother    Diabetes Maternal Grandmother    Depression Maternal Grandmother        anxiety also   Cancer Maternal Grandmother        breast   Diabetes Paternal Grandmother    Heart disease Paternal Grandfather    Cancer Paternal Grandfather        colon   Cancer Maternal Aunt        cervical   Seizures Brother        Resolved   Diabetes Paternal Uncle    Healthy Brother        x2   Healthy Son        x1    ALLERGIES:  has No Known Allergies.  MEDICATIONS:  Current Outpatient Medications  Medication Sig Dispense Refill   ascorbic acid (VITAMIN C) 1000 MG tablet Take by mouth.     Magnesium (RA NATURAL MAGNESIUM) 250 MG TABS Take by mouth.     Multiple Vitamins-Minerals (PREVENT) CAPS Take by mouth.     OCELLA 3-0.03 MG tablet Take 1 tablet by mouth daily.  2   ondansetron (ZOFRAN) 4 MG tablet Take 1 tablet (4 mg total) by mouth every 8 (eight) hours as needed for nausea or vomiting. 30 tablet 0   zinc gluconate 50 MG tablet Take by mouth.     No current facility-administered medications for this visit.      Marland Kitchen  PHYSICAL EXAMINATION: ECOG PERFORMANCE STATUS: 0 - Asymptomatic  Vitals:   03/22/21 1126  BP: 120/87  Pulse: 96  Resp: 16  Temp: 99.2 F (37.3 C)  SpO2: 99%   Filed Weights   03/22/21 1126  Weight: 212 lb  6.4 oz (96.3 kg)    Physical Exam Vitals and nursing note reviewed.  Constitutional:      Comments: Accompanied by husband.  HENT:     Head: Normocephalic and atraumatic.     Mouth/Throat:     Pharynx: Oropharynx is clear.  Eyes:     Extraocular Movements: Extraocular movements intact.     Pupils: Pupils are equal, round, and reactive to light.  Cardiovascular:     Rate and Rhythm: Normal rate and regular rhythm.  Pulmonary:     Effort: Pulmonary effort is normal.     Breath sounds: Normal breath sounds.  Abdominal:  Palpations: Abdomen is soft.  Musculoskeletal:        General: Normal range of motion.     Cervical back: Normal range of motion.  Skin:    General: Skin is warm.     Comments: Breast exam deferred. ~ 1 cm LN in right axilla/mobile.   Neurological:     General: No focal deficit present.     Mental Status: She is alert and oriented to person, place, and time.  Psychiatric:        Behavior: Behavior normal.        Judgment: Judgment normal.     LABORATORY DATA:  I have reviewed the data as listed Lab Results  Component Value Date   WBC 5.9 03/20/2021   HGB 12.9 03/20/2021   HCT 37.6 03/20/2021   MCV 83.6 03/20/2021   PLT 283 03/20/2021   Recent Labs    03/20/21 1238  NA 141  K 4.0  CL 107  CO2 26  GLUCOSE 106*  BUN 8  CREATININE 0.75  CALCIUM 10.0  GFRNONAA >60  PROT 7.6  ALBUMIN 4.1  AST 17  ALT 15  ALKPHOS 61  BILITOT 0.5    RADIOGRAPHIC STUDIES: I have personally reviewed the radiological images as listed and agreed with the findings in the report. US BREAST LTD UNI RIGHT INC AXILLA  Result Date: 03/06/2021 CLINICAL DATA:  40 year old female with a physician palpated lump in the retroareolar right breast. Additionally, the patient has noticed new right nipple retraction for the the past month. Very strong family history of breast cancer. EXAM: DIGITAL DIAGNOSTIC BILATERAL MAMMOGRAM WITH TOMOSYNTHESIS AND CAD; ULTRASOUND RIGHT  BREAST LIMITED TECHNIQUE: Bilateral digital diagnostic mammography and breast tomosynthesis was performed. The images were evaluated with computer-aided detection.; Targeted ultrasound examination of the right breast was performed COMPARISON:  Previous exam(s). ACR Breast Density Category c: The breast tissue is heterogeneously dense, which may obscure small masses. FINDINGS: A radiopaque BB was placed at the site of the patient's palpable lump in the subareolar right breast. There is an irregular, hyperdense mass with associated distortion deep to the radiopaque BB. No additional suspicious findings in the remainder of either breast. Targeted ultrasound is performed, showing an irregular, hypoechoic mass with associated vascularity at the 10 o'clock retroareolar position. It measures 2.6 x 2.4 x 2.0 cm. This correlates well with the mammographic finding in the palpable lump. Evaluation of the right axilla demonstrates a single, morphologically abnormal lymph node with diffuse cortical thickening and hilar effacement. The lymph node measures 1.2 cm in short axis dimension. IMPRESSION: 1. Suspicious right breast mass corresponding with the patient's palpable lump. Recommendation is for ultrasound-guided biopsy. 2. Suspicious right axillary lymph node. Recommendation is for ultrasound-guided biopsy. 3. No mammographic evidence of malignancy on the left. RECOMMENDATION: 1. Two area ultrasound-guided biopsy to include the right breast and right axilla. 2. Pending pathology results, further evaluation with contrast enhanced breast MRI is recommended given the patient's age, breast density and family history. I have discussed the findings and recommendations with the patient. If applicable, a reminder letter will be sent to the patient regarding the next appointment. BI-RADS CATEGORY  5: Highly suggestive of malignancy. Electronically Signed   By: Kristopher Oppenheim M.D.   On: 03/06/2021 11:58  MM DIAG BREAST TOMO  BILATERAL  Result Date: 03/06/2021 CLINICAL DATA:  40 year old female with a physician palpated lump in the retroareolar right breast. Additionally, the patient has noticed new right nipple retraction for the the past month. Very  strong family history of breast cancer. EXAM: DIGITAL DIAGNOSTIC BILATERAL MAMMOGRAM WITH TOMOSYNTHESIS AND CAD; ULTRASOUND RIGHT BREAST LIMITED TECHNIQUE: Bilateral digital diagnostic mammography and breast tomosynthesis was performed. The images were evaluated with computer-aided detection.; Targeted ultrasound examination of the right breast was performed COMPARISON:  Previous exam(s). ACR Breast Density Category c: The breast tissue is heterogeneously dense, which may obscure small masses. FINDINGS: A radiopaque BB was placed at the site of the patient's palpable lump in the subareolar right breast. There is an irregular, hyperdense mass with associated distortion deep to the radiopaque BB. No additional suspicious findings in the remainder of either breast. Targeted ultrasound is performed, showing an irregular, hypoechoic mass with associated vascularity at the 10 o'clock retroareolar position. It measures 2.6 x 2.4 x 2.0 cm. This correlates well with the mammographic finding in the palpable lump. Evaluation of the right axilla demonstrates a single, morphologically abnormal lymph node with diffuse cortical thickening and hilar effacement. The lymph node measures 1.2 cm in short axis dimension. IMPRESSION: 1. Suspicious right breast mass corresponding with the patient's palpable lump. Recommendation is for ultrasound-guided biopsy. 2. Suspicious right axillary lymph node. Recommendation is for ultrasound-guided biopsy. 3. No mammographic evidence of malignancy on the left. RECOMMENDATION: 1. Two area ultrasound-guided biopsy to include the right breast and right axilla. 2. Pending pathology results, further evaluation with contrast enhanced breast MRI is recommended given the  patient's age, breast density and family history. I have discussed the findings and recommendations with the patient. If applicable, a reminder letter will be sent to the patient regarding the next appointment. BI-RADS CATEGORY  5: Highly suggestive of malignancy. Electronically Signed   By: Kristopher Oppenheim M.D.   On: 03/06/2021 11:58  [place] AXILLARY NODE CORE BIOPSY RIGHT  Addendum Date: 03/15/2021   ADDENDUM REPORT: 03/15/2021 15:15 ADDENDUM: Pathology revealed GRADE II INVASIVE MAMMARY CARCINOMA, MAMMARY CARCINOMA IN SITU of the Right breast, 10 o'clock, retroareolar, (ribbon clip). This was found to be concordant by Dr. Audie Pinto. 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 There is no evidence of extranodal extension. This was found to be concordant by Dr. Audie Pinto. Pathology results were discussed with the patient by telephone. The patient reported doing well after the biopsies with tenderness at the sites, more at the axillary location, with minimal bleeding. Post biopsy instructions and care were reviewed and questions were answered. The patient was encouraged to call [facility] for any additional concerns. My direct phone number was provided. Surgical consultation has been arranged with Dr. Hervey Ard, per patient request, at [facility] in [place], [place] on March 19, 2021. Recommendation for a bilateral breast MRI to exclude any additional sites of disease given breast density, family history and age. Pathology results reported by Terie Purser, RN on 03/15/2021. Electronically Signed   By: Audie Pinto M.D.   On: 03/15/2021 15:15   Result Date: 03/15/2021 CLINICAL DATA:  40 year old female presenting for biopsy of a right breast mass and right axillary lymph node. EXAM: ULTRASOUND GUIDED RIGHT BREAST CORE NEEDLE BIOPSY ULTRASOUND AXILLARY NODE CORE BIOPSY RIGHT COMPARISON:  Previous exam(s). PROCEDURE:  I met with the patient and we discussed the procedure of ultrasound-guided biopsy, including benefits and alternatives. We discussed the high likelihood of a successful procedure. We discussed the risks of the procedure, including infection, bleeding, tissue injury, clip migration, and inadequate sampling. Informed written consent was given. The usual time-out protocol was performed immediately prior  to the procedure. 1.  Lesion quadrant: Upper outer quadrant Using sterile technique and 1% Lidocaine as local anesthetic, under direct ultrasound visualization, a 12 gauge spring-loaded device was used to perform biopsy of a mass in the right breast at 10 o'clock retroareolar using a lateral approach. At the conclusion of the procedure a ribbon tissue marker clip was deployed into the biopsy cavity. Follow up 2 view mammogram was performed and dictated separately. 2. Axilla Using sterile technique and 1% Lidocaine as local anesthetic, under direct ultrasound visualization, a 14 gauge spring-loaded device was used to perform biopsy of a right axillary lymph node using a inferior approach. At the conclusion of the procedure a tribell tissue marker clip was deployed into the biopsy cavity. Follow up 2 view mammogram was performed and dictated separately. IMPRESSION: Ultrasound guided biopsy of a right breast mass at 10 o'clock retroareolar and of a right axillary lymph node. No apparent complications. Electronically Signed: By: Audie Pinto M.D. On: 03/14/2021 15:29  MM CLIP PLACEMENT RIGHT  Result Date: 03/14/2021 CLINICAL DATA:  Post procedure mammogram for clip placement. EXAM: 3D DIAGNOSTIC RIGHT MAMMOGRAM POST ULTRASOUND BIOPSY COMPARISON:  Previous exam(s). FINDINGS: 3D Mammographic images were obtained following ultrasound guided biopsy of a mass in the right breast at 10 o'clock retroareolar. The ribbon biopsy marking clip is in expected position at the site of biopsy. 3D Mammographic images were obtained  following ultrasound guided biopsy of a mass in the right axilla. The tribell biopsy marking clip is in expected position at the site of biopsy. IMPRESSION: Appropriate positioning of the ribbon shaped biopsy marking clip at the site of biopsy in the right breast at 10 o'clock retroareolar. Appropriate positioning of the tribell shaped biopsy marking clip at the site of biopsy in the right axilla. Final Assessment: Post Procedure Mammograms for Marker Placement Electronically Signed   By: Audie Pinto M.D.   On: 03/14/2021 15:27  [place] RT BREAST BX W LOC DEV 1ST LESION IMG BX SPEC US GUIDE  Addendum Date: 03/15/2021   ADDENDUM REPORT: 03/15/2021 15:15 ADDENDUM: Pathology revealed GRADE II INVASIVE MAMMARY CARCINOMA, MAMMARY CARCINOMA IN SITU of the Right breast, 10 o'clock, retroareolar, (ribbon clip). This was found to be concordant by Dr. Audie Pinto. 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 There is no evidence of extranodal extension. This was found to be concordant by Dr. Audie Pinto. Pathology results were discussed with the patient by telephone. The patient reported doing well after the biopsies with tenderness at the sites, more at the axillary location, with minimal bleeding. Post biopsy instructions and care were reviewed and questions were answered. The patient was encouraged to call [facility] for any additional concerns. My direct phone number was provided. Surgical consultation has been arranged with Dr. Hervey Ard, per patient request, at [facility] in [place], [place] on March 19, 2021. Recommendation for a bilateral breast MRI to exclude any additional sites of disease given breast density, family history and age. Pathology results reported by Terie Purser, RN on 03/15/2021. Electronically Signed   By: Audie Pinto M.D.   On: 03/15/2021 15:15   Result Date: 03/15/2021 CLINICAL DATA:   40 year old female presenting for biopsy of a right breast mass and right axillary lymph node. EXAM: ULTRASOUND GUIDED RIGHT BREAST CORE NEEDLE BIOPSY ULTRASOUND AXILLARY NODE CORE BIOPSY RIGHT COMPARISON:  Previous exam(s). PROCEDURE: I met with the patient and we discussed the procedure of ultrasound-guided biopsy, including  benefits and alternatives. We discussed the high likelihood of a successful procedure. We discussed the risks of the procedure, including infection, bleeding, tissue injury, clip migration, and inadequate sampling. Informed written consent was given. The usual time-out protocol was performed immediately prior to the procedure. 1.  Lesion quadrant: Upper outer quadrant Using sterile technique and 1% Lidocaine as local anesthetic, under direct ultrasound visualization, a 12 gauge spring-loaded device was used to perform biopsy of a mass in the right breast at 10 o'clock retroareolar using a lateral approach. At the conclusion of the procedure a ribbon tissue marker clip was deployed into the biopsy cavity. Follow up 2 view mammogram was performed and dictated separately. 2. Axilla Using sterile technique and 1% Lidocaine as local anesthetic, under direct ultrasound visualization, a 14 gauge spring-loaded device was used to perform biopsy of a right axillary lymph node using a inferior approach. At the conclusion of the procedure a tribell tissue marker clip was deployed into the biopsy cavity. Follow up 2 view mammogram was performed and dictated separately. IMPRESSION: Ultrasound guided biopsy of a right breast mass at 10 o'clock retroareolar and of a right axillary lymph node. No apparent complications. Electronically Signed: By: Audie Pinto M.D. On: 03/14/2021 15:29   ASSESSMENT & PLAN:   Carcinoma of overlapping sites of right breast in female, estrogen receptor positive ([place]) #T2N1 clinical-ER/PR positive HER2 negative [Ki-67 20 to 50%].  MRI breast/PET scan pending at this time.   I reviewed the staging pathology with the patient and husband in detail.   # I had a long discussion with the patient in general regarding the treatment options of breast cancer including-surgery; adjuvant radiation; role of adjuvant systemic therapy including-chemotherapy antihormone therapy.   #I discussed the role of neoadjuvant chemotherapy in eradicating micrometastatic disease/understanding by biology of the disease/also potentially limit extensive axillary lymph node dissection. Patient will likely need lumpectomy with sentinel lymph node evaluation/axillary lymph node dissection [evaluation posttherapy] followed by radiation.   #I discussed the the standard chemotherapy option for neoadjuvant purposes including-Adriamycin Cytoxan every 2 weeks x 4 followed by Taxol weekly x12.  Discussed that ER positive tumors unlikely to achieve complete pathologic response; however better prognosis than triple negative breast cancers.   Discussed the potential side effects including but not limited to-increasing fatigue, nausea vomiting, diarrhea, hair loss, sores in the mouth, increase risk of infection and also neuropathy. Also discussed regarding potential side effects of heart failure/leukemia with Adriamycin.  #Patient reluctant with chemotherapy [given her mother's poor tolerance to chemotherapy]; she wants to wait for the PET scan to make decisions on chemotherapy.  PET scheduled on 7/27.  #Discussed that with all the therapies available-patient's prognosis is overall good ~with cure around 80% of the times.  However after chemotherapy-the cure rate might drop up to 60-70%.  In regards to the patient's queries regarding complementary/nutritional support during chemotherapy, unfortunately not available at [facility].  Did offer second opinion at tertiary centers/or at [facility].  No decisions made.   # Family history of breast cancer on maternal side: Recommend genetic testing/genetic  counseling [however mother- tested "negative"].   #Contraception: Recommend discontinuation of birth control pills.  As patient does not have any anymore children-IUD would be reasonable; however patient reluctant.  Recommend discussing with gynecology for further options.  Thank you Dr.Byrnett for allowing me to participate in the care of your pleasant patient. Please do not hesitate to contact me with questions or concerns in the interim.   # DISPOSITION: #  follow up TBD- Dr.B  All questions were answered. The patient/family knows to call the clinic with any problems, questions or concerns.    Cammie Sickle, MD 03/22/2021 11:45 PM

## 2021-03-22 NOTE — Assessment & Plan Note (Addendum)
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  MRI breast/PET scan pending at this time.  I reviewed the staging pathology with the patient and husband in detail.   # I had a long discussion with the patient in general regarding the treatment options of breast cancer including-surgery; adjuvant radiation; role of adjuvant systemic therapy including-chemotherapy antihormone therapy.   #I discussed the role of neoadjuvant chemotherapy in eradicating micrometastatic disease/understanding by biology of the disease/also potentially limit extensive axillary lymph node dissection. Patient will likely need lumpectomy with sentinel lymph node evaluation/axillary lymph node dissection [evaluation posttherapy] followed by radiation.   #I discussed the the standard chemotherapy option for neoadjuvant purposes including-Adriamycin Cytoxan every 2 weeks x 4 followed by Taxol weekly x12.  Discussed that ER positive tumors unlikely to achieve complete pathologic response; however better prognosis than triple negative breast cancers.   Discussed the potential side effects including but not limited to-increasing fatigue, nausea vomiting, diarrhea, hair loss, sores in the mouth, increase risk of infection and also neuropathy. Also discussed regarding potential side effects of heart failure/leukemia with Adriamycin.  #Patient reluctant with chemotherapy [given her mother's poor tolerance to chemotherapy]; she wants to wait for the PET scan to make decisions on chemotherapy.  PET scheduled on 7/27.  #Discussed that with all the therapies available-patient's prognosis is overall good ~with cure around 80% of the times.  However after chemotherapy-the cure rate might drop up to 60-70%.  In regards to the patient's queries regarding complementary/nutritional support during chemotherapy, unfortunately not available at [facility].  Did offer second opinion at tertiary centers/or at [facility].  No decisions  made.   # Family history of breast cancer on maternal side: Recommend genetic testing/genetic counseling [however mother- tested "negative"].   #Contraception: Recommend discontinuation of birth control pills.  As patient does not have any anymore children-IUD would be reasonable; however patient reluctant.  Recommend discussing with gynecology for further options.  Thank you Dr.Byrnett for allowing me to participate in the care of your pleasant patient. Please do not hesitate to contact me with questions or concerns in the interim.   # DISPOSITION: # follow up TBD- Dr.B

## 2021-03-26 DIAGNOSIS — C773 Secondary and unspecified malignant neoplasm of axilla and upper limb lymph nodes: Secondary | ICD-10-CM | POA: Diagnosis not present

## 2021-03-26 DIAGNOSIS — C50911 Malignant neoplasm of unspecified site of right female breast: Secondary | ICD-10-CM | POA: Diagnosis not present

## 2021-03-26 DIAGNOSIS — Z803 Family history of malignant neoplasm of breast: Secondary | ICD-10-CM | POA: Diagnosis not present

## 2021-03-27 ENCOUNTER — Encounter: Payer: Self-pay | Admitting: Plastic Surgery

## 2021-03-27 ENCOUNTER — Other Ambulatory Visit: Payer: Self-pay

## 2021-03-27 ENCOUNTER — Ambulatory Visit: Payer: 59 | Admitting: Plastic Surgery

## 2021-03-27 DIAGNOSIS — C50919 Malignant neoplasm of unspecified site of unspecified female breast: Secondary | ICD-10-CM | POA: Diagnosis not present

## 2021-03-27 NOTE — Progress Notes (Signed)
Patient ID: Shannon Hale, female    DOB: 12/14/1980, 40 y.o.   MRN: [id number]   Chief Complaint  Patient presents with   Advice Only    The patient is a 40 y.o. female who was recently diagnosed with right-sided breast cancer.  She has extremely large breasts and understands that she will have to have her right nipple areola removed with a fair amount of tissue for a partial mastectomy.  She is concerned about symmetry after the surgery.   Her breasts are extremely large and fairly symmetric.  She has hyperpigmentation of the inframammary area on both sides.  The sternal to nipple distance on the right is 31 cm and the left is 30 cm.  She is 5 feet 10 inches tall and weighs 210 pounds.  The BMI = 31.  Preoperative bra size = 38 DDD cup.  She is willing to go down to a C cup in order to be more even.  The estimated excess breast tissue to be removed at the time of surgery = 450-650 grams on the left  450-650 grams on the right.  Mammogram history: July 2022 and was positive.  Family history of breast cancer: she is positive for right-sided breast cancer as well as her mother and her maternal grandmother.  Tobacco use: None.   The patient expresses the desire to pursue surgical intervention.  She is being seen by Dr. Donne Hazel.  He is planning on a partial mastectomy   Review of Systems  Constitutional: Negative.   HENT: Negative.    Eyes: Negative.   Respiratory: Negative.    Cardiovascular: Negative.   Gastrointestinal: Negative.   Endocrine: Negative.   Genitourinary: Negative.   Neurological: Negative.   Hematological: Negative.   Psychiatric/Behavioral: Negative.     Past Medical History:  Diagnosis Date   Abnormal Pap smear    Headache(784.0)    migraines   Hepatitis 03/2004   unknown form of hepatitis, no current liver problems   History of chicken pox    History of D&C    D&E   Migraine    Nausea    PONV (postoperative nausea and vomiting)    Vaginal Pap smear,  abnormal     Past Surgical History:  Procedure Laterality Date   CHOLECYSTECTOMY N/A 12/01/2013   Procedure: LAPAROSCOPIC CHOLECYSTECTOMY ;  Surgeon: Harl Bowie, MD;  Location: [facility];  Service: General;  Laterality: N/A;   DILATION AND EVACUATION N/A 07/14/2014   Procedure: DILATATION AND EVACUATION;  Surgeon: Marylynn Pearson, MD;  Location: [facility];  Service: Gynecology;  Laterality: N/A;   SEPTOPLASTY     TONSILLECTOMY     WISDOM TOOTH EXTRACTION        Current Outpatient Medications:    Acetylcysteine (N-ACETYL-L-CYSTEINE) 600 MG CAPS, Take 600 mg by mouth., Disp: , Rfl:    APPLE CIDER VINEGAR PO, Take 450 mg by mouth., Disp: , Rfl:    ascorbic acid (VITAMIN C) 1000 MG tablet, Take by mouth., Disp: , Rfl:    Cholecalciferol (VITAMIN D-3) 125 MCG (5000 UT) TABS, Take 125 mcg by mouth., Disp: , Rfl:    Magnesium 250 MG TABS, Take by mouth., Disp: , Rfl:    OVER THE COUNTER MEDICATION, Take 15 mg by mouth. Bio-Quercetin Phytosome by [employer], Disp: , Rfl:    OVER THE COUNTER MEDICATION, Take 200 mcg by mouth. Super Selenium Complex 200 mcg with Vitamin E 20 mg, Disp: , Rfl:  zinc gluconate 50 MG tablet, Take by mouth., Disp: , Rfl:    Objective:   Vitals:   03/27/21 1511  BP: 132/78  Pulse: 84  SpO2: 100%    Physical Exam Vitals and nursing note reviewed.  Constitutional:      Appearance: Normal appearance.  HENT:     Head: Normocephalic and atraumatic.  Eyes:     Extraocular Movements: Extraocular movements intact.  Cardiovascular:     Rate and Rhythm: Normal rate.     Pulses: Normal pulses.  Pulmonary:     Effort: Pulmonary effort is normal.  Abdominal:     General: Abdomen is flat. There is no distension.     Tenderness: There is no abdominal tenderness.  Skin:    General: Skin is warm.     Capillary Refill: Capillary refill takes less than 2 seconds.     Findings: No bruising.  Neurological:     General: No focal deficit present.     Mental  Status: She is alert and oriented to person, place, and time.  Psychiatric:        Mood and Affect: Mood normal.        Behavior: Behavior normal.        Thought Content: Thought content normal.    Assessment & Plan:  Malignant neoplasm of female breast, unspecified estrogen receptor status, unspecified laterality, unspecified site of breast [facility])  The patient is a good candidate for bilateral oncoplastic breast reduction.  She is not planning to keep her NAC.  She is aware of immediate versus delayed reduction.  I have placed a call to Dr. Donne Hazel to discuss.  Pictures were obtained of the patient and placed in the chart with the patient's or guardian's permission.   [place], DO

## 2021-03-28 ENCOUNTER — Ambulatory Visit
Admission: RE | Admit: 2021-03-28 | Discharge: 2021-03-28 | Disposition: A | Discharge status: Home / Self Care | Payer: 59 | Source: Ambulatory Visit | Attending: General Surgery | Admitting: General Surgery

## 2021-03-28 DIAGNOSIS — Z170 Estrogen receptor positive status [ER+]: Secondary | ICD-10-CM | POA: Diagnosis not present

## 2021-03-28 DIAGNOSIS — C50411 Malignant neoplasm of upper-outer quadrant of right female breast: Secondary | ICD-10-CM | POA: Diagnosis not present

## 2021-03-28 DIAGNOSIS — C50919 Malignant neoplasm of unspecified site of unspecified female breast: Secondary | ICD-10-CM | POA: Diagnosis not present

## 2021-03-28 LAB — GLUCOSE, CAPILLARY: Glucose-Capillary: 80 mg/dL (ref 70–99)

## 2021-03-28 MED ORDER — FLUDEOXYGLUCOSE F - 18 (FDG) INJECTION
10.8000 | Freq: Once | INTRAVENOUS | Status: AC | PRN
  Administered 2021-03-28: 11.73 via INTRAVENOUS

## 2021-03-30 ENCOUNTER — Telehealth: Payer: Self-pay | Admitting: Internal Medicine

## 2021-03-30 NOTE — Telephone Encounter (Signed)
On 7/29-I restarted the patient to discuss the results of the PET scan [previously discussed with the patient by Dr. Bary Castilla.  Patient unavailable.  I left a detailed message for the patient given the possible lymphadenopathy would still recommend neoadjuvant chemotherapy as/best option.  Otherwise surgery followed by chemotherapy.   Patient awaiting medical oncology opinion at ? [facility] [per Dr.Byrnett].  Encouraged the patient to call us back to discuss the above plan of care.   GB  FYI-Dr.Copland.

## 2021-04-02 ENCOUNTER — Ambulatory Visit
Admission: RE | Admit: 2021-04-02 | Discharge: 2021-04-02 | Disposition: A | Discharge status: Home / Self Care | Payer: 59 | Source: Ambulatory Visit | Attending: Obstetrics and Gynecology | Admitting: Obstetrics and Gynecology

## 2021-04-02 ENCOUNTER — Ambulatory Visit: Payer: 59 | Admitting: Rehabilitation

## 2021-04-02 DIAGNOSIS — Z853 Personal history of malignant neoplasm of breast: Secondary | ICD-10-CM

## 2021-04-02 DIAGNOSIS — C50411 Malignant neoplasm of upper-outer quadrant of right female breast: Secondary | ICD-10-CM | POA: Diagnosis not present

## 2021-04-02 MED ORDER — GADOBUTROL 1 MMOL/ML IV SOLN
10.0000 mL | Freq: Once | INTRAVENOUS | Status: AC | PRN
  Administered 2021-04-02: 10 mL via INTRAVENOUS

## 2021-04-03 DIAGNOSIS — E54 Ascorbic acid deficiency: Secondary | ICD-10-CM | POA: Diagnosis not present

## 2021-04-03 DIAGNOSIS — E568 Deficiency of other vitamins: Secondary | ICD-10-CM | POA: Diagnosis not present

## 2021-04-03 DIAGNOSIS — M545 Low back pain, unspecified: Secondary | ICD-10-CM | POA: Diagnosis not present

## 2021-04-03 DIAGNOSIS — R5382 Chronic fatigue, unspecified: Secondary | ICD-10-CM | POA: Diagnosis not present

## 2021-04-03 DIAGNOSIS — E538 Deficiency of other specified B group vitamins: Secondary | ICD-10-CM | POA: Diagnosis not present

## 2021-04-03 DIAGNOSIS — R100 Acute abdomen: Secondary | ICD-10-CM | POA: Diagnosis not present

## 2021-04-03 DIAGNOSIS — E559 Vitamin D deficiency, unspecified: Secondary | ICD-10-CM | POA: Diagnosis not present

## 2021-04-03 DIAGNOSIS — R799 Abnormal finding of blood chemistry, unspecified: Secondary | ICD-10-CM | POA: Diagnosis not present

## 2021-04-04 ENCOUNTER — Other Ambulatory Visit: Payer: Self-pay

## 2021-04-04 ENCOUNTER — Inpatient Hospital Stay: Payer: 59 | Attending: Internal Medicine | Admitting: *Deleted

## 2021-04-04 ENCOUNTER — Telehealth (INDEPENDENT_AMBULATORY_CARE_PROVIDER_SITE_OTHER): Payer: 59 | Admitting: Plastic Surgery

## 2021-04-04 DIAGNOSIS — C50919 Malignant neoplasm of unspecified site of unspecified female breast: Secondary | ICD-10-CM

## 2021-04-04 DIAGNOSIS — C50811 Malignant neoplasm of overlapping sites of right female breast: Secondary | ICD-10-CM

## 2021-04-04 DIAGNOSIS — Z170 Estrogen receptor positive status [ER+]: Secondary | ICD-10-CM

## 2021-04-04 NOTE — Progress Notes (Addendum)
Multidisciplinary Oncology Council Documentation  Shannon Hale was presented by our [facility] on 04/04/2021, which included representatives from:  Palliative Care Dietitian  Physical/Occupational Therapist Nurse Navigator Genetics Speech Therapist Social work Research RN   Shannon Hale currently presents with history of breast cancer.  We reviewed previous medical and familial history, history of present illness, and recent lab results along with all available histopathologic and imaging studies. The [place] considered available treatment options and made the following recommendations/referrals:  Consider referral to genetics and rehab screening.   The MOC is a meeting of clinicians from various specialty areas who evaluate and discuss patients for whom a multidisciplinary approach is being considered. Final determinations in the plan of care are those of the provider(s).   Today's extended care, comprehensive team conference, Shannon Hale was not present for the discussion and was not examined.

## 2021-04-04 NOTE — Progress Notes (Signed)
The patient is a 40 year old female joining me by phone for a telemetry visit per her preference and request.  She had an MRI done yesterday which shows some concerning areas in the right breast.  A mastectomy is now advised.  She is going to get genetics and that may change the overall plan.  At this point she is thinking about a right sided mastectomy with Dr. Donne Hazel in full agreement.  Her history is right sided breast cancer.  She is 5 feet 10 inches tall and weighs 210 pounds she has a BMI of 31.  Her preoperative bra size is a 38 DDD.  She is fine with going down to a C cup.  She is not a smoker.  I connected with  HELEYNA CROCCO on 04/04/21 by a video enabled telemedicine application and verified that I am speaking with the correct person using two identifiers.   I discussed the limitations of evaluation and management by telemedicine. The patient expressed understanding and agreed to proceed.  I was at the office and the patient was in [place].

## 2021-04-05 ENCOUNTER — Encounter: Payer: Self-pay | Admitting: *Deleted

## 2021-04-05 ENCOUNTER — Telehealth: Payer: Self-pay | Admitting: *Deleted

## 2021-04-05 NOTE — Telephone Encounter (Signed)
Spoke with patient to introduce myself and assess navigation resources and give contact information.  Confirmed appt with Dr. Lindi Adie for 8/9 at 1:15pm.  Patient denies any questions or concerns at this time.  Encouraged her to call should anything arise.  Patient verbalized understanding.

## 2021-04-09 NOTE — Progress Notes (Signed)
[facility] NOTE  Patient Care Team: Copland, Gay Filler, MD as PCP - General (Family Medicine) Theodore Demark, RN as Oncology Nurse Navigator Rockwell Germany, RN as Oncology Nurse Navigator Mauro Kaufmann, RN as Oncology Nurse Navigator  CHIEF COMPLAINTS/PURPOSE OF CONSULTATION:  Newly diagnosed right breast cancer  HISTORY OF PRESENTING ILLNESS:  Shannon Hale 40 y.o. female is here because of recent diagnosis of invasive mammary carcinoma of the right breast. She palpated a lump in the retroareolar right breast with noticeable right nipple retraction. Diagnostic mammogram and [place] on 03/06/21 showed suspicious right breast mass and right axillary lymph node. Biopsy on 03/14/21 showed invasive mammary carcinoma metastatic to axillary lymph node, ER/PR+ (95%), Her2-. She presents to the clinic today for initial evaluation and discussion of treatment options.   I reviewed her records extensively and collaborated the history with the patient.  SUMMARY OF ONCOLOGIC HISTORY: Oncology History Overview Note  # July 2022-mammary carcinoma right breast retroareolar 2.2 cm [palpable mass]; suspicious on mammogram ultrasound/biopsy-  NEGATIVE for Her2 (1+); Estrogen Receptor: 95%, POSITIVE, Progesterone Receptor: 95%, POSITIVE, Proliferation Marker Ki67: 20%, FOCALLY 50%   Carcinoma of overlapping sites of right breast in female, estrogen receptor positive ([place])  03/14/2021 Initial Diagnosis   Patient palpated a lump in the retroareolar right breast with nipple retraction.  Mammogram revealed right breast mass 2.6 cm and a single right axillary lymph node measuring 1.2 cm.  (PET/CT negative) breast MRI revealed 4.2 cm right retroareolar mass with non-mass enhancement total measuring 14 cm, intraparenchymal lymph node 1.1 cm.  2 abnormal right axillary lymph nodes, biopsy revealed grade 2 IDC with DCIS ER 95%, PR 95%, Ki-67 20% focally 50%, HER2 negative, lymph node positive      MEDICAL HISTORY:  Past Medical History:  Diagnosis Date   Abnormal Pap smear    Headache(784.0)    migraines   Hepatitis 03/2004   unknown form of hepatitis, no current liver problems   History of chicken pox    History of D&C    D&E   Migraine    Nausea    PONV (postoperative nausea and vomiting)    Vaginal Pap smear, abnormal     SURGICAL HISTORY: Past Surgical History:  Procedure Laterality Date   CHOLECYSTECTOMY N/A 12/01/2013   Procedure: LAPAROSCOPIC CHOLECYSTECTOMY ;  Surgeon: Harl Bowie, MD;  Location: [facility];  Service: General;  Laterality: N/A;   DILATION AND EVACUATION N/A 07/14/2014   Procedure: DILATATION AND EVACUATION;  Surgeon: Marylynn Pearson, MD;  Location: [facility];  Service: Gynecology;  Laterality: N/A;   SEPTOPLASTY     TONSILLECTOMY     WISDOM TOOTH EXTRACTION      SOCIAL HISTORY: Social History   Socioeconomic History   Marital status: Married    Spouse name: Not on file   Number of children: Not on file   Years of education: Not on file   Highest education level: Not on file  Occupational History   Not on file  Tobacco Use   Smoking status: Former    Packs/day: 0.25    Years: 0.50    Pack years: 0.13    Types: Cigarettes    Quit date: 10/11/2002    Years since quitting: 18.5   Smokeless tobacco: Never  Substance and Sexual Activity   Alcohol use: No   Drug use: No   Sexual activity: Yes  Other Topics Concern   Not on file  Social History Narrative  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  Patient husband works at vascular department at [employer]. no smoking; rare alcohol.  Lives in [place].    Social Determinants of Health   Financial Resource Strain: Not on file  Food Insecurity: Not on file  Transportation Needs: Not on file  Physical Activity: Not on file  Stress: Not on file  Social Connections: Not on file  Intimate Partner Violence: Not on file    FAMILY HISTORY: Family  History  Problem Relation Age of Onset   Cancer Mother        breast, leaukemia-Living   Angina Father    Hypertension Maternal Grandmother    Diabetes Maternal Grandmother    Depression Maternal Grandmother        anxiety also   Cancer Maternal Grandmother        breast   Diabetes Paternal Grandmother    Heart disease Paternal Grandfather    Cancer Paternal Grandfather        colon   Cancer Maternal Aunt        cervical   Seizures Brother        Resolved   Diabetes Paternal Uncle    Healthy Brother        x2   Healthy Son        x1    ALLERGIES:  has No Known Allergies.  MEDICATIONS:  Current Outpatient Medications  Medication Sig Dispense Refill   Acetylcysteine (N-ACETYL-L-CYSTEINE) 600 MG CAPS Take 600 mg by mouth.     APPLE CIDER VINEGAR PO Take 450 mg by mouth.     ascorbic acid (VITAMIN C) 1000 MG tablet Take by mouth.     Cholecalciferol (VITAMIN D-3) 125 MCG (5000 UT) TABS Take 125 mcg by mouth.     Magnesium 250 MG TABS Take by mouth.     OVER THE COUNTER MEDICATION Take 15 mg by mouth. Bio-Quercetin Phytosome by [employer] Extension     OVER THE COUNTER MEDICATION Take 200 mcg by mouth. Super Selenium Complex 200 mcg with Vitamin E 20 mg     zinc gluconate 50 MG tablet Take by mouth.     No current facility-administered medications for this visit.    REVIEW OF SYSTEMS:    All other systems were reviewed with the patient and are negative.  PHYSICAL EXAMINATION: ECOG PERFORMANCE STATUS: 1 - Symptomatic but completely ambulatory  Vitals:   04/10/21 1317  BP: 134/74  Pulse: 84  Resp: 18  Temp: (!) 97.5 F (36.4 C)  SpO2: 98%   Filed Weights   04/10/21 1317  Weight: 207 lb 6.4 oz (94.1 kg)     LABORATORY DATA:  I have reviewed the data as listed Lab Results  Component Value Date   WBC 5.9 03/20/2021   HGB 12.9 03/20/2021   HCT 37.6 03/20/2021   MCV 83.6 03/20/2021   PLT 283 03/20/2021   Lab Results  Component Value Date   NA 141  03/20/2021   K 4.0 03/20/2021   CL 107 03/20/2021   CO2 26 03/20/2021    RADIOGRAPHIC STUDIES: I have personally reviewed the radiological reports and agreed with the findings in the report.  ASSESSMENT AND PLAN:  Carcinoma of overlapping sites of right breast in female, estrogen receptor positive ([place]) 03/14/2021: Patient palpated a lump in the retroareolar right breast with nipple retraction.  Mammogram revealed right breast mass 2.6 cm and a single right axillary lymph node measuring 1.2 cm.  (PET/CT  negative) breast MRI revealed 4.2 cm right retroareolar mass with non-mass enhancement total measuring 14 cm, intraparenchymal lymph node 1.1 cm.  2 abnormal right axillary lymph nodes, biopsy revealed grade 2 IDC with DCIS ER 95%, PR 95%, Ki-67 20% focally 50%, HER2 negative, lymph node positive  Pathology and radiology counseling: Discussed with the patient, the details of pathology including the type of breast cancer,the clinical staging, the significance of ER, PR and HER-2/neu receptors and the implications for treatment. After reviewing the pathology in detail, we proceeded to discuss the different treatment options between surgery, radiation, chemotherapy, antiestrogen therapies. PET CT scan: Negative for distant metastatic disease  Treatment plan: 1.  MammaPrint testing to determine if she would benefit from chemotherapy 2.  adjuvant chemotherapy if necessary 3.  Mastectomy with targeted node dissection 4.  Adjuvant antiestrogen therapy with AI plus ovarian suppression plus or minus abemaciclib versus tamoxifen with or without abemaciclib  Return to clinic based upon MammaPrint test result If she is high risk then I will call her back to discuss chemotherapy. 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  Her husband also works as an Restaurant manager, fast food at [employer]. She is not very keen on either radiation or chemotherapy.  All questions were answered.  The patient knows to call the clinic with any problems, questions or concerns.   Rulon Eisenmenger, MD, MPH 04/10/2021    I, Thana Ates, am acting as scribe for Nicholas Lose, MD.  I have reviewed the above documentation for accuracy and completeness, and I agree with the above.

## 2021-04-10 ENCOUNTER — Inpatient Hospital Stay: Payer: 59 | Attending: Hematology and Oncology | Admitting: Hematology and Oncology

## 2021-04-10 ENCOUNTER — Other Ambulatory Visit: Payer: Self-pay

## 2021-04-10 DIAGNOSIS — Z800 Family history of malignant neoplasm of digestive organs: Secondary | ICD-10-CM | POA: Insufficient documentation

## 2021-04-10 DIAGNOSIS — Z170 Estrogen receptor positive status [ER+]: Secondary | ICD-10-CM | POA: Insufficient documentation

## 2021-04-10 DIAGNOSIS — Z87891 Personal history of nicotine dependence: Secondary | ICD-10-CM | POA: Insufficient documentation

## 2021-04-10 DIAGNOSIS — Z803 Family history of malignant neoplasm of breast: Secondary | ICD-10-CM | POA: Insufficient documentation

## 2021-04-10 DIAGNOSIS — C773 Secondary and unspecified malignant neoplasm of axilla and upper limb lymph nodes: Secondary | ICD-10-CM | POA: Diagnosis not present

## 2021-04-10 DIAGNOSIS — Z8049 Family history of malignant neoplasm of other genital organs: Secondary | ICD-10-CM | POA: Diagnosis not present

## 2021-04-10 DIAGNOSIS — C50811 Malignant neoplasm of overlapping sites of right female breast: Secondary | ICD-10-CM | POA: Insufficient documentation

## 2021-04-10 NOTE — Assessment & Plan Note (Signed)
03/14/2021: Patient palpated a lump in the retroareolar right breast with nipple retraction.  Mammogram revealed right breast mass 2.6 cm and a single right axillary lymph node measuring 1.2 cm.  (PET/CT negative) breast MRI revealed 4.2 cm right retroareolar mass with non-mass enhancement total measuring 14 cm, intraparenchymal lymph node 1.1 cm.  2 abnormal right axillary lymph nodes, biopsy revealed grade 2 IDC with DCIS ER 95%, PR 95%, Ki-67 20% focally 50%, HER2 negative, lymph node positive  Pathology and radiology counseling: Discussed with the patient, the details of pathology including the type of breast cancer,the clinical staging, the significance of ER, PR and HER-2/neu receptors and the implications for treatment. After reviewing the pathology in detail, we proceeded to discuss the different treatment options between surgery, radiation, chemotherapy, antiestrogen therapies. PET CT scan: Negative for distant metastatic disease  Treatment plan: 1.  MammaPrint testing to determine if she would benefit from chemotherapy 2. neoadjuvant chemotherapy if necessary 3.  Mastectomy with targeted node dissection 4.  Adjuvant antiestrogen therapy with AI plus ovarian suppression plus or minus abemaciclib  Return to clinic based upon MammaPrint test result

## 2021-04-11 ENCOUNTER — Encounter: Payer: Self-pay | Admitting: *Deleted

## 2021-04-11 ENCOUNTER — Telehealth: Payer: Self-pay | Admitting: *Deleted

## 2021-04-11 ENCOUNTER — Ambulatory Visit: Payer: 59 | Attending: General Surgery | Admitting: Physical Therapy

## 2021-04-11 DIAGNOSIS — R293 Abnormal posture: Secondary | ICD-10-CM | POA: Insufficient documentation

## 2021-04-11 DIAGNOSIS — Z170 Estrogen receptor positive status [ER+]: Secondary | ICD-10-CM | POA: Diagnosis not present

## 2021-04-11 DIAGNOSIS — C50811 Malignant neoplasm of overlapping sites of right female breast: Secondary | ICD-10-CM | POA: Insufficient documentation

## 2021-04-11 NOTE — Telephone Encounter (Signed)
Ordered mammaprint (core) per Dr. Lindi Adie. Faxed requisition to pathology and [facility].

## 2021-04-11 NOTE — Patient Instructions (Addendum)
Physical Therapy Information for After Breast Cancer Surgery/Treatment:  Lymphedema is a swelling condition that you may be at risk for in your arm if you have lymph nodes removed from the armpit area.  After a sentinel node biopsy, the risk is approximately 5-9% and is higher after an axillary node dissection.  There is treatment available for this condition and it is not life-threatening.  Contact your physician or physical therapist with concerns. You may begin the 4 shoulder/posture exercises (see additional sheet) when permitted by your physician (typically a week after surgery).  If you have drains, you may need to wait until those are removed before beginning range of motion exercises.  A general recommendation is to not lift your arms above shoulder height until drains are removed.  These exercises should be done to your tolerance and gently.  This is not a "no pain/no gain" type of recovery so listen to your body and stretch into the range of motion that you can tolerate, stopping if you have pain.  If you are having immediate reconstruction, ask your plastic surgeon about doing exercises as he or she may want you to wait. We encourage you to attend the free one time ABC (After Breast Cancer) class offered by [facility].  You will learn information related to lymphedema risk, prevention and treatment and additional exercises to regain mobility following surgery.  You can call [phone] for more information.  This is offered the 1st and 3rd Monday of each month.  You only attend the class one time. While undergoing any medical procedure or treatment, try to avoid blood pressure being taken or needle sticks from occurring on the arm on the side of cancer.   This recommendation begins after surgery and continues for the rest of your life.  This may help reduce your risk of getting lymphedema (swelling in your arm). An excellent resource for those seeking information on  lymphedema is the National Lymphedema Network's web site. It can be accessed at [place].org If you notice swelling in your hand, arm or breast at any time following surgery (even if it is many years from now), please contact your doctor or physical therapist to discuss this.  Lymphedema can be treated at any time but it is easier for you if it is treated early on.  If you feel like your shoulder motion is not returning to normal in a reasonable amount of time, please contact your surgeon or physical therapist.  [facility]. [place], [place], [place] [phone]; [address], [place], [place] [address] ABC CLASS After Breast Cancer Class  After Breast Cancer Class is a specially designed exercise class to assist you in a safe recover after having breast cancer surgery.  In this class you will learn how to get back to full function whether your drains were just removed or if you had surgery a month ago.  This one-time class is held the 1st and 3rd Monday of every month from 11:00 a.m. until 12:00 noon at the [facility] located at [place], [place] [address]  This class is FREE and space is limited. For more information or to register for the next available class, call [phone].  Class Goals  Understand specific stretches to improve the flexibility of you chest and shoulder. Learn ways to safely strengthen your upper body and improve your posture. Understand the warning signs of infection and why you may be at risk for an arm infection.  Learn about Lymphedema and prevention.  ** You do not attend this class until after surgery.  Drains must be removed to participate  Patient was instructed today in a home exercise program today for post op shoulder range of motion. These included active assist shoulder flexion in sitting, scapular retraction, wall walking with shoulder abduction, and hands behind head external rotation.  She was encouraged to do these  twice a day, holding 3 seconds and repeating 5 times when permitted by her physician.        First of all, check with your insurance company to see if provider is in [facility] (for wigs and compression sleeves / gloves/gauntlets )   [address] [place], [place] Phone: [phone]   Fax: [phone] Will file some insurances --- call for appointment   Second to [facility] (for mastectomy prosthetics and garments) [place], [place] [address] Phone: [phone]  Fax: [phone] Will file some insurances --- call for appointment  [facility] Dylynn [facility] [place]  [address]  [place], [place] [address] [phone] Lower extremity garments  [facility] [address] [place], [place]  [phone] [place] and [facility]sthetics (for compression garments, especilly for lower extremities) [address] [place], [place]  [address] [phone] Call for appointment    Jerrol Banana ,certified fitter [facility]  [phone]  [employer] (for mastectomy supplies and garments) [place], [place] [address] [phone]  Other Resources: National Lymphedema Network:  www.lymphnet.org www.Klosetraining.com for patient articles and self manual lymph drainage information www.lymphedemablog.com has informative articles.  DishTag.[email or web address].com www.lymphedemaproducts.com www.brightlifedirect.com

## 2021-04-11 NOTE — Therapy (Signed)
[place], [place], [address] Phone: [phone]   Fax:  [phone]  Physical Therapy Evaluation  Patient Details  Name: Shannon Hale MRN: [id number] Date of Birth: 10/13/1980 Referring Provider (PT): Dr. Donne Hazel   Encounter Date: 04/11/2021   PT End of Session - 04/11/21 1628     Visit Number 1    Number of Visits 2    Date for PT Re-Evaluation 06/11/21    PT Start Time 1505    PT Stop Time 1600    PT Time Calculation (min) 55 min    Activity Tolerance Patient tolerated treatment well    Behavior During Therapy [facility] for tasks assessed/performed             Past Medical History:  Diagnosis Date   Abnormal Pap smear    Headache(784.0)    migraines   Hepatitis 03/2004   unknown form of hepatitis, no current liver problems   History of chicken pox    History of D&C    D&E   Migraine    Nausea    PONV (postoperative nausea and vomiting)    Vaginal Pap smear, abnormal     Past Surgical History:  Procedure Laterality Date   CHOLECYSTECTOMY N/A 12/01/2013   Procedure: LAPAROSCOPIC CHOLECYSTECTOMY ;  Surgeon: Harl Bowie, MD;  Location: [facility];  Service: General;  Laterality: N/A;   DILATION AND EVACUATION N/A 07/14/2014   Procedure: DILATATION AND EVACUATION;  Surgeon: Marylynn Pearson, MD;  Location: [facility];  Service: Gynecology;  Laterality: N/A;   SEPTOPLASTY     TONSILLECTOMY     WISDOM TOOTH EXTRACTION      There were no vitals filed for this visit.    Subjective Assessment - 04/11/21 1512     Subjective Pt is here for pre-op information. She does not have a surgery date yet. She likely will have a unilateral mastectomy and reduction and lift on the other side.    Pertinent History right breast cancer ER/PR+, Her2-, Ki 67 20% . 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  She was told in the past that she has "scapular rotation" on the right     Patient Stated Goals to get information and do get baseline measuarements    Currently in Pain? No/denies   has pain in left shoulder depending on movment               [facility] PT Assessment - 04/11/21 0001       Assessment   Medical Diagnosis right breast cancer    Referring Provider (PT) Dr. Donne Hazel    Onset Date/Surgical Date 03/15/21    Hand Dominance Right;Left   mainly right     Restrictions   Weight Bearing Restrictions No      Balance Screen   Has the patient fallen in the past 6 months No    Has the patient had a decrease in activity level because of a fear of falling?  No    Is the patient reluctant to leave their home because of a fear of falling?  No      Home Environment   Living Environment Private residence    Living Arrangements Spouse/significant other;Children   30 and 55 year old boys   Available Help at Discharge Family;Friend(s)      Prior Function   Level of Independence Independent    Vocation Part time employment   works lots  of hours on weekends at [employer]   Vocation Requirements pushing stretches , lifting patines   Sonographer ( ultrasound tech )   Leisure pt has been walking on her treadill and doing shoulder  exercises with weights      Cognition   Overall Cognitive Status Within Functional Limits for tasks assessed      Observation/Other Assessments   Observations well appearing young woman walks in without device    Other Surveys  Quick Dash    Quick DASH  11.36      Sensation   Light Touch Appears Intact      Coordination   Gross Motor Movements are Fluid and Coordinated Yes      Posture/Postural Control   Posture/Postural Control Postural limitations    Postural Limitations Rounded Shoulders;Forward head      ROM / Strength   AROM / PROM / Strength AROM;PROM;Strength      AROM   Overall AROM  Within functional limits for tasks performed    AROM Assessment Site Shoulder    Right/Left Shoulder Right;Left     Right Shoulder Flexion 157 Degrees    Right Shoulder ABduction 170 Degrees    Right Shoulder External Rotation 90 Degrees    Left Shoulder Flexion 165 Degrees    Left Shoulder ABduction 170 Degrees    Left Shoulder External Rotation 90 Degrees      Strength   Overall Strength Within functional limits for tasks performed               LYMPHEDEMA/ONCOLOGY QUESTIONNAIRE - 04/11/21 0001       Type   Cancer Type right breast      Lymphedema Assessments   Lymphedema Assessments Upper extremities      Right Upper Extremity Lymphedema   10 cm Proximal to Olecranon Process 34.5 cm    Olecranon Process 28.8 cm    15 cm Proximal to Ulnar Styloid Process 27.9 cm    Just Proximal to Ulnar Styloid Process 17 cm    Across Hand at [employer] 19.5 cm    At [place] of 2nd Digit 6.5 cm      Left Upper Extremity Lymphedema   10 cm Proximal to Olecranon Process 34 cm    Olecranon Process 28.2 cm    15 cm Proximal to Ulnar Styloid Process 27 cm    Just Proximal to Ulnar Styloid Process 16.4 cm    Across Hand at [employer] 19.2 cm    At [place] of 2nd Digit 6.1 cm             L-DEX FLOWSHEETS - 04/11/21 1600       L-DEX LYMPHEDEMA SCREENING   Measurement Type Unilateral    L-DEX MEASUREMENT EXTREMITY Upper Extremity    POSITION  Standing    DOMINANT SIDE Right    At Risk Side Right    BASELINE SCORE (UNILATERAL) -3.3                  Quick Dash - 04/11/21 0001     Open a tight or new jar No difficulty    Do heavy household chores (wash walls, wash floors) Mild difficulty    Carry a shopping bag or briefcase Mild difficulty    Wash your back Mild difficulty    Use a knife to cut food No difficulty    Recreational activities in which you take some force or impact through your arm, shoulder, or hand (  golf, hammering, tennis) Mild difficulty    During the past week, to what extent has your arm, shoulder or hand problem interfered with your normal social activities  with family, friends, neighbors, or groups? Not at all    During the past week, to what extent has your arm, shoulder or hand problem limited your work or other regular daily activities Not at all    Arm, shoulder, or hand pain. Mild    Tingling (pins and needles) in your arm, shoulder, or hand None    Difficulty Sleeping No difficulty              Objective measurements completed on examination: See above findings.                     Breast Clinic Goals - 04/11/21 1637       Patient will be able to verbalize understanding of pertinent lymphedema risk reduction practices relevant to her diagnosis specifically related to skin care.   Period Days    Status Achieved      Patient will be able to return demonstrate and/or verbalize understanding of the post-op home exercise program related to regaining shoulder range of motion.   Time 1    Period Days    Status Achieved      Patient will be able to verbalize understanding of the importance of attending the postoperative After Breast Cancer Class for further lymphedema risk reduction education and therapeutic exercise.   Time 1    Period Days    Status Achieved                   Plan - 04/11/21 1629     Clinical Impression Statement Pt comes in today for pre-op assessement for shoulder ROM , circumference and SOZO and to learn execises that she should know for after surgery.  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  Showed her samples of the [place] compression bra and gave her information about getting a compression sleeve and gauntlet that may be needed at that time. Pt is doing exercises now, so gave her a copy of the Strength ABC program for her to start practicing.  Pt does yet know the date of her surgery so she will call to scheudle her 3 week post op appointment and her 3 month [place] appointment.    Personal  Factors and Comorbidities Comorbidity 3+    Comorbidities history of bilateral shoulder tendinitis, will have mastectomy with lymph node removal    Stability/Clinical Decision Making Stable/Uncomplicated    Clinical Decision Making Low    Rehab Potential Good    PT Frequency --   post op appointment   PT Duration --   3 weeks after surgery   PT Treatment/Interventions ADLs/Self Care Home Management;Therapeutic exercise;Therapeutic activities;Patient/family education    PT Next Visit Plan reassess, upgrade home exercise program, recert if needed    PT Home Exercise Plan post op exercises    Consulted and Agree with Plan of Care Patient            Patient will follow up at outpatient cancer rehab 3-4 weeks following surgery.  If the patient requires physical therapy at that time, a specific plan will be dictated and sent to the referring physician for approval. The patient was educated today on appropriate basic range of motion exercises to begin post operatively  and the importance of attending the After Breast Cancer class following surgery.  Patient was educated today on lymphedema risk reduction practices as it pertains to recommendations that will benefit the patient immediately following surgery.  She verbalized good understanding.    The patient was assessed using the L-Dex machine today to produce a lymphedema index baseline score. The patient will be reassessed on a regular basis (typically every 3 months) to obtain new L-Dex scores. If the score is > 6.5 points away from his/her baseline score indicating onset of subclinical lymphedema, it will be recommended to wear a compression garment for 4 weeks, 12 hours per day and then be reassessed. If the score continues to be > 6.5 points from baseline at reassessment, we will initiate lymphedema treatment. Assessing in this manner has a 95% rate of preventing clinically significant lymphedema.   Patient will benefit from skilled therapeutic  intervention in order to improve the following deficits and impairments:  Decreased knowledge of precautions, Decreased knowledge of use of DME  Visit Diagnosis: Carcinoma of overlapping sites of right breast in female, estrogen receptor positive ([place]) - Plan: PT plan of care cert/re-cert  Abnormal posture - Plan: PT plan of care cert/re-cert     Problem List Patient Active Problem List   Diagnosis Date Noted   Breast cancer ([place]) 03/27/2021   Carcinoma of overlapping sites of right breast in female, estrogen receptor positive ([place]) 03/22/2021   Headache 06/26/2020   Rash 06/26/2020   New daily persistent headache 05/21/2018   Disorder of ligament of left hand 07/20/2015   Obesity 09/25/2014   Dermatitis 12/31/2013   Cholelithiasis 02/29/2012   BACK PAIN, THORACIC REGION 11/19/2010   APHTHOUS STOMATITIS 08/10/2009   MIGRAINE HEADACHE 09/23/2008   Donato Heinz. Owens Shark PT   [place]wood Levo 04/11/2021, 4:41 PM  [place], [place], [address] Phone: [phone]   Fax:  [phone]  Name: Shannon Hale MRN: [id number] Date of Birth: 1981/04/30

## 2021-04-12 ENCOUNTER — Encounter: Payer: Self-pay | Admitting: Licensed Clinical Social Worker

## 2021-04-12 NOTE — Progress Notes (Signed)
[place] Work  Clinical Social Work was referred by new pt protocol for assessment of psychosocial needs.  Clinical Social Worker contacted patient by phone  to offer support and assess for needs.   Patient reports that she is doing fairly well right now and has strong support from people in her life. She works weekends at [employer] and has two young sons (80 & 40 yo). Work has been supportive so far. She is waiting to tell her sons until she has all of the information. Denies any basic resource needs at this time.  CSW and patient discussed the importance of support during treatment.  CSW informed patient of the support team and support services at [facility].  CSW provided contact information and encouraged patient to call with any questions or concerns.   [place], [place] Worker [employer]

## 2021-04-13 ENCOUNTER — Other Ambulatory Visit: Payer: Self-pay | Admitting: General Surgery

## 2021-04-13 DIAGNOSIS — C50811 Malignant neoplasm of overlapping sites of right female breast: Secondary | ICD-10-CM

## 2021-04-13 DIAGNOSIS — Z170 Estrogen receptor positive status [ER+]: Secondary | ICD-10-CM

## 2021-04-16 DIAGNOSIS — C50811 Malignant neoplasm of overlapping sites of right female breast: Secondary | ICD-10-CM | POA: Diagnosis not present

## 2021-04-23 ENCOUNTER — Other Ambulatory Visit: Payer: Self-pay | Admitting: General Surgery

## 2021-04-23 ENCOUNTER — Encounter: Payer: Self-pay | Admitting: *Deleted

## 2021-04-23 ENCOUNTER — Telehealth: Payer: Self-pay | Admitting: *Deleted

## 2021-04-23 DIAGNOSIS — C50811 Malignant neoplasm of overlapping sites of right female breast: Secondary | ICD-10-CM

## 2021-04-23 DIAGNOSIS — Z170 Estrogen receptor positive status [ER+]: Secondary | ICD-10-CM

## 2021-04-23 NOTE — Telephone Encounter (Signed)
Received mammaprint results (core) high risk.  Patient is aware.  Scheduled my chart video visit per pt. Request for 8/23 at 230. Team notified.

## 2021-04-23 NOTE — Progress Notes (Signed)
HEMATOLOGY-ONCOLOGY MYCHART VIDEO VISIT PROGRESS NOTE  I connected with Shannon Hale on 04/24/2021 at  2:30 PM EDT by MyChart video conference and verified that I am speaking with the correct person using two identifiers.  I discussed the limitations, risks, security and privacy concerns of performing an evaluation and management service by MyChart and the availability of in person appointments.  I also discussed with the patient that there may be a patient responsible charge related to this service. The patient expressed understanding and agreed to proceed.  Patient's Location: Home Physician Location: Clinic  CHIEF COMPLIANT: Follow-up of right breast cancer  INTERVAL HISTORY: Shannon Hale is a 40 y.o. female with above-mentioned history of right breast cancer. She presents via [place] today for follow-up and to review MammaPrint test result.   She was found to be high risk on MammaPrint.  She is here today with a MyChart virtual visit to discuss pros and cons of systemic chemotherapy.  Her surgery is being scheduled for mid October.  Oncology History Overview Note  # July 2022-mammary carcinoma right breast retroareolar 2.2 cm [palpable mass]; suspicious on mammogram ultrasound/biopsy-  NEGATIVE for Her2 (1+); Estrogen Receptor: 95%, POSITIVE, Progesterone Receptor: 95%, POSITIVE, Proliferation Marker Ki67: 20%, FOCALLY 50%   Carcinoma of overlapping sites of right breast in female, estrogen receptor positive ([place])  03/14/2021 Initial Diagnosis   Patient palpated a lump in the retroareolar right breast with nipple retraction.  Mammogram revealed right breast mass 2.6 cm and a single right axillary lymph node measuring 1.2 cm.  (PET/CT negative) breast MRI revealed 4.2 cm right retroareolar mass with non-mass enhancement total measuring 14 cm, intraparenchymal lymph node 1.1 cm.  2 abnormal right axillary lymph nodes, biopsy revealed grade 2 IDC with DCIS ER 95%, PR 95%, Ki-67 20%  focally 50%, HER2 negative, lymph node positive   04/10/2021 Cancer Staging   Staging form: Breast, AJCC 8th Edition - Clinical: Stage IIA (cT2, cN1, cM0, G2, ER+, PR+, HER2-) - Signed by Nicholas Lose, MD on 04/10/2021 Stage prefix: Initial diagnosis Histologic grading system: 3 grade system   04/24/2021 -  Anti-estrogen oral therapy   Tamoxifen 10 mg daily started neoadjuvantly because of slight delay in surgery     Observations/Objective:  There were no vitals filed for this visit. There is no height or weight on file to calculate BMI.  I have reviewed the data as listed CMP Latest Ref Rng & Units 03/20/2021 12/03/2017 11/25/2013  Glucose 70 - 99 mg/dL 106(H) 92 91  BUN 6 - 20 mg/dL 8 5(L) 8  Creatinine 0.44 - 1.00 mg/dL 0.75 0.71 0.75  Sodium 135 - 145 mmol/L 141 137 140  Potassium 3.5 - 5.1 mmol/L 4.0 4.2 4.2  Chloride 98 - 111 mmol/L 107 104 103  CO2 22 - 32 mmol/L _0 Calcium 8.9 - 10.3 mg/dL 10.0 9.1 10.1  Total Protein 6.5 - 8.1 g/dL 7.6 7.6 8.2  Total Bilirubin 0.3 - 1.2 mg/dL 0.5 0.4 0.3  Alkaline Phos 38 - 126 U/L 61 76 79  AST 15 - 41 U/L _1 ALT 0 - 44 U/L _2 Lab Results  Component Value Date   WBC 5.9 03/20/2021   HGB 12.9 03/20/2021   HCT 37.6 03/20/2021   MCV 83.6 03/20/2021   PLT 283 03/20/2021   NEUTROABS 3.4 03/20/2021      Assessment Plan:  Carcinoma of overlapping sites of right breast in female, estrogen receptor  positive ([place]) 03/14/2021: Patient palpated a lump in the retroareolar right breast with nipple retraction.  Mammogram revealed right breast mass 2.6 cm and a single right axillary lymph node measuring 1.2 cm.  (PET/CT negative) breast MRI revealed 4.2 cm right retroareolar mass with non-mass enhancement total measuring 14 cm, intraparenchymal lymph node 1.1 cm.  2 abnormal right axillary lymph nodes, biopsy revealed grade 2 IDC with DCIS ER 95%, PR 95%, Ki-67 20% focally 50%, HER2 negative, lymph node positive  PET CT scan:  Negative for distant metastatic disease MammaPrint: High risk  Treatment plan: 1.  Mastectomy with targeted node dissection 2.  adjuvant chemotherapy with dose dense Adriamycin and Cytoxan x4 followed by Taxol x12 3. Adjuvant antiestrogen therapy with AI plus ovarian suppression plus or minus abemaciclib versus tamoxifen with or without abemaciclib ------------------------------------------------------------------------------------------------------------------------------------ Chemotherapy Counseling: I discussed the risks and benefits of chemotherapy including the risks of nausea/ vomiting, risk of infection from low WBC count, fatigue due to chemo or anemia, bruising or bleeding due to low platelets, mouth sores, loss/ change in taste and decreased appetite. Liver and kidney function will be monitored through out chemotherapy as abnormalities in liver and kidney function may be a side effect of treatment. Cardiac dysfunction due to Adriamycin and neuropathy from Taxol were discussed in detail. Risk of permanent bone marrow dysfunction and leukemia due to chemo were also discussed.  Plan: Port will be placed during surgery. We will obtain an echocardiogram and chemo class after surgery  Patient is not entirely sure about chemotherapy.  Her mother had breast cancer chemotherapy and developed acute leukemia.  I discussed with her that it is not genetic in nature.   I recommended starting her on tamoxifen 10 mg daily until 1 week before surgery.    I discussed the assessment and treatment plan with the patient. The patient was provided an opportunity to ask questions and all were answered. The patient agreed with the plan and demonstrated an understanding of the instructions. The patient was advised to call back or seek an in-person evaluation if the symptoms worsen or if the condition fails to improve as anticipated.   Total time spent: 20 minutes including face-to-face MyChart video visit  time and time spent for planning, charting and coordination of care  Rulon Eisenmenger, MD 04/24/2021  I, Thana Ates am acting as scribe for Nicholas Lose, MD.  I have reviewed the above documentation for accuracy and completeness, and I agree with the above.

## 2021-04-24 ENCOUNTER — Encounter: Payer: Self-pay | Admitting: *Deleted

## 2021-04-24 ENCOUNTER — Telehealth (HOSPITAL_BASED_OUTPATIENT_CLINIC_OR_DEPARTMENT_OTHER): Payer: 59 | Admitting: Hematology and Oncology

## 2021-04-24 ENCOUNTER — Other Ambulatory Visit: Payer: Self-pay

## 2021-04-24 DIAGNOSIS — Z800 Family history of malignant neoplasm of digestive organs: Secondary | ICD-10-CM | POA: Diagnosis not present

## 2021-04-24 DIAGNOSIS — C50811 Malignant neoplasm of overlapping sites of right female breast: Secondary | ICD-10-CM | POA: Diagnosis not present

## 2021-04-24 DIAGNOSIS — Z170 Estrogen receptor positive status [ER+]: Secondary | ICD-10-CM

## 2021-04-24 DIAGNOSIS — Z87891 Personal history of nicotine dependence: Secondary | ICD-10-CM | POA: Diagnosis not present

## 2021-04-24 DIAGNOSIS — C773 Secondary and unspecified malignant neoplasm of axilla and upper limb lymph nodes: Secondary | ICD-10-CM | POA: Diagnosis not present

## 2021-04-24 DIAGNOSIS — Z803 Family history of malignant neoplasm of breast: Secondary | ICD-10-CM | POA: Diagnosis not present

## 2021-04-24 DIAGNOSIS — Z8049 Family history of malignant neoplasm of other genital organs: Secondary | ICD-10-CM | POA: Diagnosis not present

## 2021-04-24 MED ORDER — TAMOXIFEN CITRATE 20 MG PO TABS
10.0000 mg | ORAL_TABLET | Freq: Every day | ORAL | 0 refills | Status: DC
  Filled 2021-04-24: qty 30, 60d supply, fill #0

## 2021-04-24 NOTE — Assessment & Plan Note (Addendum)
03/14/2021: Patient palpated a lump in the retroareolar right breast with nipple retraction.  Mammogram revealed right breast mass 2.6 cm and a single right axillary lymph node measuring 1.2 cm.  (PET/CT negative) breast MRI revealed 4.2 cm right retroareolar mass with non-mass enhancement total measuring 14 cm, intraparenchymal lymph node 1.1 cm.  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  Mastectomy with targeted node dissection 2.  adjuvant chemotherapy with dose dense Adriamycin and Cytoxan x4 followed by Taxol x12 3. Adjuvant antiestrogen therapy with AI plus ovarian suppression plus or minus abemaciclib versus tamoxifen with or without abemaciclib ------------------------------------------------------------------------------------------------------------------------------------ Chemotherapy Counseling: I discussed the risks and benefits of chemotherapy including the risks of nausea/ vomiting, risk of infection from low WBC count, fatigue due to chemo or anemia, bruising or bleeding due to low platelets, mouth sores, loss/ change in taste and decreased appetite. Liver and kidney function will be monitored through out chemotherapy as abnormalities in liver and kidney function may be a side effect of treatment. Cardiac dysfunction due to Adriamycin and neuropathy from Taxol were discussed in detail. Risk of permanent bone marrow dysfunction and leukemia due to chemo were also discussed.  Plan: Port will be placed during surgery. We will obtain an echocardiogram and chemo class after surgery  Patient is not entirely sure about chemotherapy.  Her mother had breast cancer chemotherapy and developed acute leukemia.  I discussed with her that it is not genetic in nature.   I recommended starting her  on tamoxifen 10 mg daily until 1 week before surgery.

## 2021-04-25 ENCOUNTER — Telehealth: Payer: Self-pay | Admitting: Hematology and Oncology

## 2021-04-25 ENCOUNTER — Telehealth: Payer: Self-pay | Admitting: *Deleted

## 2021-04-25 ENCOUNTER — Encounter: Payer: Self-pay | Admitting: *Deleted

## 2021-04-25 NOTE — Telephone Encounter (Signed)
Per Dr. Donne Hazel surgery scheduler, she was offered 05/23/21 for sx date. This did not work well with pts husband work schedule. Pt chose to have surgery on 06/06/21

## 2021-04-25 NOTE — Telephone Encounter (Signed)
Scheduled appt per 8/23 sch msg. Pt aware.  

## 2021-05-03 ENCOUNTER — Other Ambulatory Visit: Payer: Self-pay | Admitting: General Surgery

## 2021-05-03 ENCOUNTER — Other Ambulatory Visit (HOSPITAL_COMMUNITY): Payer: Self-pay | Admitting: General Surgery

## 2021-05-03 DIAGNOSIS — C50911 Malignant neoplasm of unspecified site of right female breast: Secondary | ICD-10-CM

## 2021-05-04 DIAGNOSIS — C50919 Malignant neoplasm of unspecified site of unspecified female breast: Secondary | ICD-10-CM | POA: Diagnosis not present

## 2021-05-04 DIAGNOSIS — E349 Endocrine disorder, unspecified: Secondary | ICD-10-CM | POA: Diagnosis not present

## 2021-05-04 DIAGNOSIS — E279 Disorder of adrenal gland, unspecified: Secondary | ICD-10-CM | POA: Diagnosis not present

## 2021-05-07 NOTE — Progress Notes (Signed)
[facility] [place], [place], [place] [address] [phone]  Date:  05/09/2021   Name:  Shannon Hale   DOB:  [birth year]   MRN:  [id number]  PCP:  Darreld Mclean, MD    Chief Complaint: Annual Exam (Concerns/ questions: stress, recently dx with breast cancer/Flu vaccination: would like to discuss)   History of Present Illness:  Shannon Hale is a 40 y.o. very pleasant female patient who presents with the following:  Patient seen today for physical exam.  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  Right as the patient was losing her mother she was also diagnosed with breast cancer-her gynecologist noticed a mass which led to her breast cancer diagnosis She is planning a unilateal mastectomy with node dissection this October per Dr Donne Hazel with immediate recon with Dr. Marla Roe Next steps depend some on her surgical results  We are not sure if she will need to consider chemo and/ or radiation They also plan to do an oophorectomy at some point in the future  COVID-19 series Pap smear- per GYN  Flu shot-she plans to have done per her job Lab work done in July including complete metabolic, CBC Can offer her thyroid, vitamin D, lipid, A1c- per pt these labs already done per her "naturopath" doctor. She has been working on cutting down on sweets and has lost 22 lbs She is getting more exercise as well  She notes worsening of her HA again- wonders how much stress may be playing into this Sometimes her headaches are migraine, sometimes they are a mild tension type headache She would like some zofran to use as needed  for associated nausea She does not usually get vomiting- also not usually aura  She has tried triptans and not found them to be all that helpful-we decided to try a CGRP inhibitor and see if this may be more helpful Patient Active Problem List   Diagnosis Date Noted   Breast cancer ([place]) 03/27/2021   Carcinoma of overlapping sites of right breast in female, estrogen receptor positive ([place]) 03/22/2021   Headache 06/26/2020   Rash 06/26/2020   New daily persistent headache 05/21/2018   Disorder of ligament of left hand 07/20/2015   Obesity 09/25/2014   Dermatitis 12/31/2013   Cholelithiasis 02/29/2012   BACK PAIN, THORACIC REGION 11/19/2010   APHTHOUS STOMATITIS 08/10/2009   MIGRAINE HEADACHE 09/23/2008    Past Medical History:  Diagnosis Date   Abnormal Pap smear    Headache(784.0)    migraines   Hepatitis 03/2004   unknown form of hepatitis, no current liver problems   History of chicken pox    History of D&C    D&E   Migraine    Nausea    PONV (postoperative nausea and vomiting)    Vaginal Pap smear, abnormal     Past Surgical History:  Procedure Laterality Date   CHOLECYSTECTOMY N/A 12/01/2013   Procedure: LAPAROSCOPIC CHOLECYSTECTOMY ;  Surgeon: Harl Bowie, MD;  Location: [facility];  Service: General;  Laterality: N/A;   DILATION AND EVACUATION N/A 07/14/2014  Procedure: DILATATION AND EVACUATION;  Surgeon: Marylynn Pearson, MD;  Location: [facility];  Service: Gynecology;  Laterality: N/A;   SEPTOPLASTY     TONSILLECTOMY     WISDOM TOOTH EXTRACTION      Social History   Tobacco Use   Smoking status: Former    Packs/day: 0.25    Years: 0.50    Pack years: 0.13    Types: Cigarettes    Quit date: 10/11/2002    Years since quitting: 18.5   Smokeless tobacco: Never  Substance Use Topics   Alcohol use: No   Drug use: No    Family History  Problem Relation Age of Onset   Cancer Mother        breast, leaukemia-Living   Angina Father    Hypertension Maternal  Grandmother    Diabetes Maternal Grandmother    Depression Maternal Grandmother        anxiety also   Cancer Maternal Grandmother        breast   Diabetes Paternal Grandmother    Heart disease Paternal Grandfather    Cancer Paternal Grandfather        colon   Cancer Maternal Aunt        cervical   Seizures Brother        Resolved   Diabetes Paternal Uncle    Healthy Brother        x2   Healthy Son        x1    No Known Allergies  Medication list has been reviewed and updated.  Current Outpatient Medications on File Prior to Visit  Medication Sig Dispense Refill   Acetylcysteine (N-ACETYL-L-CYSTEINE) 600 MG CAPS Take 600 mg by mouth.     APPLE CIDER VINEGAR PO Take 450 mg by mouth.     ascorbic acid (VITAMIN C) 1000 MG tablet Take by mouth.     Cholecalciferol (VITAMIN D-3) 125 MCG (5000 UT) TABS Take 125 mcg by mouth.     Magnesium 250 MG TABS Take by mouth.     OVER THE COUNTER MEDICATION Take 15 mg by mouth. Bio-Quercetin Phytosome by All Life Extension     OVER THE COUNTER MEDICATION Take 200 mcg by mouth. Super Selenium Complex 200 mcg with Vitamin E 20 mg     tamoxifen (NOLVADEX) 20 MG tablet Take 0.5 tablets (10 mg total) by mouth daily. 30 tablet 0   zinc gluconate 50 MG tablet Take by mouth.     No current facility-administered medications on file prior to visit.    Review of Systems:  As per HPI- otherwise negative.   Physical Examination: Vitals:   05/09/21 1425  BP: 120/62  Pulse: 100  Resp: 18  Temp: 98.2 F (36.8 C)  SpO2: 98%   Vitals:   05/09/21 1425  Weight: 199 lb (90.3 kg)  Height: '5\' 9"'$  (1.753 m)   Body mass index is 29.39 kg/m. Ideal Body Weight: Weight in (lb) to have BMI = 25: 168.9  GEN: no acute distress.  Overweight, looks well HEENT: Atraumatic, Normocephalic.  Bilateral TM wnl, oropharynx normal.  PEERL,EOMI.   Ears and Nose: No external deformity. CV: RRR, No M/G/R. No JVD. No thrill. No extra heart sounds. PULM: CTA B,  no wheezes, crackles, rhonchi. No retractions. No resp. distress. No accessory muscle use. ABD: S, NT, ND, +BS. No rebound. No HSM. EXTR: No c/c/e PSYCH: Normally interactive. Conversant.    Assessment and Plan: Physical exam  Migraine without aura and without status  migrainosus, not intractable - Plan: ondansetron (ZOFRAN) 4 MG tablet, Rimegepant Sulfate (NURTEC) 75 MG TBDP  Patient seen today for physical exam.  Her lab work is up-to-date.  Unfortunately she was recently diagnosed with breast cancer, we spent some time today discussing this diagnosis and her treatment options.  She is still trying to decide if she wishes to pursue chemo, radiation, tamoxifen.  Offered support Prescribed Nurtec for her to try for her migraine headache.  She will let me know if helpful.  Also prescribe Zofran to use as needed This visit occurred during the SARS-CoV-2 public health emergency.  Safety protocols were in place, including screening questions prior to the visit, additional usage of staff PPE, and extensive cleaning of exam room while observing appropriate contact time as indicated for disinfecting solutions.   Signed Lamar Blinks, MD

## 2021-05-09 ENCOUNTER — Other Ambulatory Visit: Payer: Self-pay

## 2021-05-09 ENCOUNTER — Ambulatory Visit (INDEPENDENT_AMBULATORY_CARE_PROVIDER_SITE_OTHER): Payer: 59 | Admitting: Family Medicine

## 2021-05-09 VITALS — BP 120/62 | HR 100 | Temp 98.2°F | Resp 18 | Ht 69.0 in | Wt 199.0 lb

## 2021-05-09 DIAGNOSIS — G43009 Migraine without aura, not intractable, without status migrainosus: Secondary | ICD-10-CM

## 2021-05-09 DIAGNOSIS — Z0000 Encounter for general adult medical examination without abnormal findings: Secondary | ICD-10-CM | POA: Diagnosis not present

## 2021-05-09 MED ORDER — ONDANSETRON HCL 4 MG PO TABS
4.0000 mg | ORAL_TABLET | Freq: Three times a day (TID) | ORAL | 2 refills | Status: DC | PRN
  Filled 2021-05-09: qty 30, 10d supply, fill #0
  Filled 2022-04-25: qty 30, 10d supply, fill #1

## 2021-05-09 MED ORDER — NURTEC 75 MG PO TBDP
ORAL_TABLET | ORAL | 1 refills | Status: AC
  Filled 2021-05-09: qty 8, 18d supply, fill #0
  Filled 2021-05-24: qty 8, 30d supply, fill #0

## 2021-05-09 NOTE — Patient Instructions (Signed)
It was good to see you today- I hope that all goes smoothly with your upcoming surgery.  Let me know if you need anything at all! Try the nurtec as needed for migraine headache- if helpful we can certainly rx more  I would recommend getting a flu shot and the "bivalent" covid booster (should be at our pharmacy soon) prior to your surgery

## 2021-05-10 ENCOUNTER — Encounter: Payer: Self-pay | Admitting: Hematology and Oncology

## 2021-05-10 ENCOUNTER — Telehealth: Payer: Self-pay | Admitting: *Deleted

## 2021-05-10 NOTE — Telephone Encounter (Signed)
Prior auth started via cover my meds.  Awaiting determination.  Key: [id number]

## 2021-05-10 NOTE — Telephone Encounter (Signed)
The authorization is effective for a maximum of 6 fill(s) from 05/10/2021 to 11/06/2021.

## 2021-05-14 DIAGNOSIS — E279 Disorder of adrenal gland, unspecified: Secondary | ICD-10-CM | POA: Diagnosis not present

## 2021-05-14 DIAGNOSIS — E349 Endocrine disorder, unspecified: Secondary | ICD-10-CM | POA: Diagnosis not present

## 2021-05-15 ENCOUNTER — Other Ambulatory Visit: Payer: Self-pay

## 2021-05-15 ENCOUNTER — Ambulatory Visit (HOSPITAL_COMMUNITY)
Admission: RE | Admit: 2021-05-15 | Discharge: 2021-05-15 | Disposition: A | Discharge status: Home / Self Care | Payer: 59 | Source: Ambulatory Visit | Attending: General Surgery | Admitting: General Surgery

## 2021-05-15 DIAGNOSIS — C773 Secondary and unspecified malignant neoplasm of axilla and upper limb lymph nodes: Secondary | ICD-10-CM | POA: Diagnosis not present

## 2021-05-15 DIAGNOSIS — C50611 Malignant neoplasm of axillary tail of right female breast: Secondary | ICD-10-CM | POA: Diagnosis not present

## 2021-05-15 DIAGNOSIS — C50911 Malignant neoplasm of unspecified site of right female breast: Secondary | ICD-10-CM | POA: Diagnosis not present

## 2021-05-15 MED ORDER — GADOBUTROL 1 MMOL/ML IV SOLN
9.0000 mL | Freq: Once | INTRAVENOUS | Status: AC | PRN
  Administered 2021-05-15: 9 mL via INTRAVENOUS

## 2021-05-21 NOTE — Progress Notes (Signed)
Patient ID: Shannon Hale, female    DOB: 08/07/1981, 40 y.o.   MRN: [id number]  Chief Complaint  Patient presents with   Pre-op Exam      ICD-10-CM   1. Carcinoma of overlapping sites of right breast in female, estrogen receptor positive ([place])  C50.811    Z17.0        History of Present Illness: Shannon Hale is a 40 y.o.  female  with a history of right-sided breast cancer.  She presents for preoperative evaluation for upcoming procedure, mastectomy and immediate breast reconstruction with expander placement along with left sided oncoplastic reduction, scheduled for 06/06/2021 with Dr. Marla Roe.  The patient has not had problems with anesthesia.  She does have a history of PONV, well controlled with Zofran.  She reports that her mother had breast cancer along with CLL and lymphoma.  She states that her mother developed a provoked DVT late into her cancer diagnosis.  No other personal or family history of DVT or clotting disorder.  No tobacco use or DM.  No varicosities.  She has been off of her oral contraceptives for over a month.    Summary of Previous Visit: Patient was first seen here in office on 03/27/2021 after she was recently diagnosed with right-sided breast cancer.  STN 31 cm right side and 30 cm left side.  Preoperative bra size equals 38 DDD.  Initial plan was for partial mastectomy, but after MRI performed after initial consult, plan has since changed to mastectomy to be performed by Dr. Donne Hazel.  Patient is fine with C cup.  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  PMH Significant for: Right-sided breast cancer.  Patient tells me that she is now leaning towards a double mastectomy with a aesthetic flat closure.  She understands that this is a last-minute pivot, but she has given a lot of thought and this is what she feels most comfortable doing.  Dr. Marla Roe will call patient to discuss further.   Past Medical  History: Allergies: No Known Allergies  Current Medications:  Current Outpatient Medications:    ascorbic acid (VITAMIN C) 1000 MG tablet, Take by mouth., Disp: , Rfl:    Cholecalciferol (VITAMIN D-3) 125 MCG (5000 UT) TABS, Take 125 mcg by mouth., Disp: , Rfl:    ondansetron (ZOFRAN) 4 MG tablet, Take 1 tablet (4 mg total) by mouth every 8 (eight) hours as needed for nausea or vomiting., Disp: 30 tablet, Rfl: 2   Rimegepant Sulfate (NURTEC) 75 MG TBDP, Use one tablet daily as needed for migraine, Disp: 5 tablet, Rfl: 1   Acetylcysteine (N-ACETYL-L-CYSTEINE) 600 MG CAPS, Take 600 mg by mouth. (Patient not taking: Reported on 05/22/2021), Disp: , Rfl:    APPLE CIDER VINEGAR PO, Take 450 mg by mouth. (Patient not taking: Reported on 05/22/2021), Disp: , Rfl:    Magnesium 250 MG TABS, Take by mouth. (Patient not taking: Reported on 05/22/2021), Disp: , Rfl:    OVER THE COUNTER MEDICATION, Take 15 mg by mouth. Bio-Quercetin Phytosome by All Life Extension (Patient not taking: Reported on 05/22/2021), Disp: , Rfl:    OVER THE COUNTER MEDICATION, Take 200 mcg by mouth. Super Selenium Complex 200 mcg with Vitamin E 20 mg (Patient not taking: Reported on 05/22/2021), Disp: , Rfl:    tamoxifen (NOLVADEX) 20 MG tablet, Take 0.5 tablets (10 mg total) by mouth daily. (Patient not taking: Reported on 05/22/2021), Disp: 30 tablet, Rfl: 0  zinc gluconate 50 MG tablet, Take by mouth. (Patient not taking: Reported on 05/22/2021), Disp: , Rfl:   Past Medical Problems: Past Medical History:  Diagnosis Date   Abnormal Pap smear    Headache(784.0)    migraines   Hepatitis 03/2004   unknown form of hepatitis, no current liver problems   History of chicken pox    History of D&C    D&E   Migraine    Nausea    PONV (postoperative nausea and vomiting)    Vaginal Pap smear, abnormal     Past Surgical History: Past Surgical History:  Procedure Laterality Date   CHOLECYSTECTOMY N/A 12/01/2013   Procedure:  LAPAROSCOPIC CHOLECYSTECTOMY ;  Surgeon: Harl Bowie, MD;  Location: [facility];  Service: General;  Laterality: N/A;   DILATION AND EVACUATION N/A 07/14/2014   Procedure: DILATATION AND EVACUATION;  Surgeon: Marylynn Pearson, MD;  Location: [facility];  Service: Gynecology;  Laterality: N/A;   SEPTOPLASTY     TONSILLECTOMY     WISDOM TOOTH EXTRACTION      Social History: Social History   Socioeconomic History   Marital status: Married    Spouse name: Not on file   Number of children: Not on file   Years of education: Not on file   Highest education level: Not on file  Occupational History   Not on file  Tobacco Use   Smoking status: Former    Packs/day: 0.25    Years: 0.50    Pack years: 0.13    Types: Cigarettes    Quit date: 10/11/2002    Years since quitting: 18.6   Smokeless tobacco: Never  Substance and Sexual Activity   Alcohol use: No   Drug use: No   Sexual activity: Yes  Other Topics Concern   Not on file  Social History Narrative   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  Patient husband works at vascular department at [employer]. no smoking; rare alcohol.  Lives in [place] boys.    Social Determinants of Health   Financial Resource Strain: Not on file  Food Insecurity: Not on file  Transportation Needs: Not on file  Physical Activity: Not on file  Stress: Not on file  Social Connections: Not on file  Intimate Partner Violence: Not on file    Family History: Family History  Problem Relation Age of Onset   Cancer Mother        breast, leaukemia-Living   Angina Father    Hypertension Maternal Grandmother    Diabetes Maternal Grandmother    Depression Maternal Grandmother        anxiety also   Cancer Maternal Grandmother        breast   Diabetes Paternal Grandmother    Heart disease Paternal Grandfather    Cancer Paternal Grandfather        colon   Cancer Maternal Aunt        cervical   Seizures Brother         Resolved   Diabetes Paternal Uncle    Healthy Brother        x2   Healthy Son        x1    Review of Systems: Review of Systems  Constitutional:  Negative for fever.    Physical Exam: Vital Signs BP 128/79 (BP Location: Left Arm, Patient Position: Sitting, Cuff Size: Normal)   Pulse 98   Ht '5\' 9"'$  (1.753 m)   Wt 195 lb 3.2 oz (  88.5 kg)   LMP 05/22/2021 (Exact Date)   SpO2 99%   BMI 28.83 kg/m   Physical Exam  Constitutional:      General: Not in acute distress.    Appearance: Normal appearance. Not ill-appearing.  HENT:     Head: Normocephalic and atraumatic.  Eyes:     Pupils: Pupils are equal, round Neck:     Musculoskeletal: Normal range of motion.  Cardiovascular:     Rate and Rhythm: Normal rate    Pulses: Normal pulses.  Pulmonary:     Effort: Pulmonary effort is normal. No respiratory distress.  Abdominal:     General: Abdomen is flat. There is no distension.  Musculoskeletal: Normal range of motion.  No lower extremity swelling or edema.  No varicosities noted.  Peripheral pulses intact and symmetric throughout. Skin:    General: Skin is warm and dry.     Findings: No erythema or rash.  Neurological:     General: No focal deficit present.     Mental Status: Alert and oriented to person, place, and time. Mental status is at baseline.     Motor: No weakness.  Psychiatric:        Mood and Affect: Mood normal.        Behavior: Behavior normal.    Assessment/Plan: The patient is scheduled for right-sided mastectomy with immediate breast reconstruction along with left-sided oncoplastic reduction 06/06/2021 with Dr. Marla Roe.  Risks, benefits, and alternatives of procedure discussed, questions answered and consent obtained.    Patient tells me that she is now leaning towards a double mastectomy with a aesthetic flat closure.  She understands that this is a last-minute pivot, but she has given a lot of thought and this is what she feels most comfortable doing.   Dr. Marla Roe will call patient to discuss further.  Smoking Status: Non-smoker. Last Mammogram: 05/15/2021; Results: Known right breast malignancy.  Caprini Score: 8; Risk Factors include: Family history of a provoked blood clot (mother), BMI greater than 25, known malignancy, and length of planned surgery. Recommendation for mechanical prophylaxis, but will given high Caprini score will discuss with surgeon. Encourage early ambulation.   Pictures obtained: At initial consult 03/27/2021.  Post-op Rx sent to pharmacy: Norco, Keflex, Zofran  Patient was provided with the General Surgical Risk consent document and Pain Medication Agreement prior to their appointment.  They had adequate time to read through the risk consent documents and Pain Medication Agreement. We also discussed them in person together during this preop appointment. All of their questions were answered to their satisfaction.  Recommended calling if they have any further questions.  Risk consent form and Pain Medication Agreement to be scanned into patient's chart.  The risk that can be encountered with breast reduction were discussed and include the following but not limited to these:  Breast asymmetry, fluid accumulation, firmness of the breast, inability to breast feed, loss of nipple or areola, skin loss, decrease or no nipple sensation, fat necrosis of the breast tissue, bleeding, infection, healing delay.  There are risks of anesthesia, changes to skin sensation and injury to nerves or blood vessels.  The muscle can be temporarily or permanently injured.  You may have an allergic reaction to tape, suture, glue, blood products which can result in skin discoloration, swelling, pain, skin lesions, poor healing.  Any of these can lead to the need for revisonal surgery or stage procedures.  A reduction has potential to interfere with diagnostic procedures.  Nipple or breast  piercing can increase risks of infection.  This procedure is  best done when the breast is fully developed.  Changes in the breast will continue to occur over time.  Pregnancy can alter the outcomes of previous breast reduction surgery, weight gain and weigh loss can also effect the long term appearance.    Electronically signed by: Krista Blue, PA-C 05/22/2021 4:29 PM

## 2021-05-22 ENCOUNTER — Ambulatory Visit (INDEPENDENT_AMBULATORY_CARE_PROVIDER_SITE_OTHER): Payer: 59 | Admitting: Physician Assistant

## 2021-05-22 ENCOUNTER — Other Ambulatory Visit: Payer: Self-pay

## 2021-05-22 ENCOUNTER — Encounter: Payer: Self-pay | Admitting: Physician Assistant

## 2021-05-22 VITALS — BP 128/79 | HR 98 | Ht 69.0 in | Wt 195.2 lb

## 2021-05-22 DIAGNOSIS — Z170 Estrogen receptor positive status [ER+]: Secondary | ICD-10-CM

## 2021-05-22 DIAGNOSIS — C50811 Malignant neoplasm of overlapping sites of right female breast: Secondary | ICD-10-CM

## 2021-05-22 MED ORDER — CEPHALEXIN 500 MG PO CAPS
500.0000 mg | ORAL_CAPSULE | Freq: Four times a day (QID) | ORAL | 0 refills | Status: AC
  Filled 2021-05-22: qty 12, 3d supply, fill #0

## 2021-05-22 MED ORDER — ONDANSETRON 4 MG PO TBDP
4.0000 mg | ORAL_TABLET | Freq: Three times a day (TID) | ORAL | 0 refills | Status: DC | PRN
  Filled 2021-05-22: qty 20, 7d supply, fill #0

## 2021-05-22 MED ORDER — HYDROCODONE-ACETAMINOPHEN 5-325 MG PO TABS
1.0000 | ORAL_TABLET | Freq: Four times a day (QID) | ORAL | 0 refills | Status: AC | PRN
  Filled 2021-05-22: qty 20, 5d supply, fill #0

## 2021-05-24 ENCOUNTER — Other Ambulatory Visit: Payer: Self-pay

## 2021-05-30 DIAGNOSIS — H52223 Regular astigmatism, bilateral: Secondary | ICD-10-CM | POA: Diagnosis not present

## 2021-05-31 ENCOUNTER — Encounter (HOSPITAL_BASED_OUTPATIENT_CLINIC_OR_DEPARTMENT_OTHER): Payer: Self-pay | Admitting: General Surgery

## 2021-05-31 ENCOUNTER — Other Ambulatory Visit: Payer: Self-pay

## 2021-05-31 ENCOUNTER — Telehealth: Payer: Self-pay

## 2021-05-31 NOTE — Telephone Encounter (Signed)
Patient called to say that [employer] has approved for her to have six weeks out for her surgery.  Currently, her return to work date is at the 4-week mark, so [employer] needs an addendum changing her return to work date from 07/07/2021 to 07/21/2021.

## 2021-05-31 NOTE — Telephone Encounter (Signed)
Done! Updated [employer] forms are signed and out front. Thanks!

## 2021-06-05 ENCOUNTER — Ambulatory Visit
Admission: RE | Admit: 2021-06-05 | Discharge: 2021-06-05 | Disposition: A | Discharge status: Home / Self Care | Payer: 59 | Source: Ambulatory Visit | Attending: General Surgery | Admitting: General Surgery

## 2021-06-05 ENCOUNTER — Other Ambulatory Visit: Payer: Self-pay

## 2021-06-05 ENCOUNTER — Other Ambulatory Visit: Payer: Self-pay | Admitting: General Surgery

## 2021-06-05 DIAGNOSIS — C801 Malignant (primary) neoplasm, unspecified: Secondary | ICD-10-CM | POA: Diagnosis not present

## 2021-06-05 DIAGNOSIS — C50811 Malignant neoplasm of overlapping sites of right female breast: Secondary | ICD-10-CM

## 2021-06-05 DIAGNOSIS — C773 Secondary and unspecified malignant neoplasm of axilla and upper limb lymph nodes: Secondary | ICD-10-CM | POA: Diagnosis not present

## 2021-06-05 DIAGNOSIS — Z170 Estrogen receptor positive status [ER+]: Secondary | ICD-10-CM

## 2021-06-05 NOTE — Progress Notes (Signed)

## 2021-06-06 ENCOUNTER — Encounter (HOSPITAL_BASED_OUTPATIENT_CLINIC_OR_DEPARTMENT_OTHER)
Admission: RE | Disposition: A | Discharge status: Home / Self Care | Payer: Self-pay | Source: Home / Self Care | Attending: General Surgery

## 2021-06-06 ENCOUNTER — Ambulatory Visit (HOSPITAL_BASED_OUTPATIENT_CLINIC_OR_DEPARTMENT_OTHER): Payer: 59 | Admitting: Anesthesiology

## 2021-06-06 ENCOUNTER — Encounter (HOSPITAL_BASED_OUTPATIENT_CLINIC_OR_DEPARTMENT_OTHER): Payer: Self-pay | Admitting: General Surgery

## 2021-06-06 ENCOUNTER — Ambulatory Visit
Admission: RE | Admit: 2021-06-06 | Discharge: 2021-06-06 | Disposition: A | Discharge status: Home / Self Care | Payer: 59 | Source: Ambulatory Visit | Attending: General Surgery | Admitting: General Surgery

## 2021-06-06 ENCOUNTER — Encounter (HOSPITAL_COMMUNITY): Payer: 59

## 2021-06-06 ENCOUNTER — Observation Stay (HOSPITAL_BASED_OUTPATIENT_CLINIC_OR_DEPARTMENT_OTHER)
Admission: RE | Admit: 2021-06-06 | Discharge: 2021-06-07 | Disposition: A | Discharge status: Home / Self Care | Payer: 59 | Attending: General Surgery | Admitting: General Surgery

## 2021-06-06 ENCOUNTER — Other Ambulatory Visit: Payer: Self-pay

## 2021-06-06 DIAGNOSIS — Z170 Estrogen receptor positive status [ER+]: Secondary | ICD-10-CM

## 2021-06-06 DIAGNOSIS — C50912 Malignant neoplasm of unspecified site of left female breast: Secondary | ICD-10-CM | POA: Insufficient documentation

## 2021-06-06 DIAGNOSIS — Z9013 Acquired absence of bilateral breasts and nipples: Secondary | ICD-10-CM

## 2021-06-06 DIAGNOSIS — C773 Secondary and unspecified malignant neoplasm of axilla and upper limb lymph nodes: Secondary | ICD-10-CM | POA: Diagnosis not present

## 2021-06-06 DIAGNOSIS — C50811 Malignant neoplasm of overlapping sites of right female breast: Secondary | ICD-10-CM

## 2021-06-06 DIAGNOSIS — G8918 Other acute postprocedural pain: Secondary | ICD-10-CM | POA: Diagnosis not present

## 2021-06-06 DIAGNOSIS — Z87891 Personal history of nicotine dependence: Secondary | ICD-10-CM | POA: Insufficient documentation

## 2021-06-06 DIAGNOSIS — C50911 Malignant neoplasm of unspecified site of right female breast: Secondary | ICD-10-CM | POA: Diagnosis not present

## 2021-06-06 DIAGNOSIS — G43909 Migraine, unspecified, not intractable, without status migrainosus: Secondary | ICD-10-CM | POA: Diagnosis not present

## 2021-06-06 DIAGNOSIS — C801 Malignant (primary) neoplasm, unspecified: Secondary | ICD-10-CM | POA: Diagnosis not present

## 2021-06-06 HISTORY — PX: RADIOACTIVE SEED GUIDED AXILLARY SENTINEL LYMPH NODE: SHX6735

## 2021-06-06 HISTORY — PX: MASTECTOMY W/ SENTINEL NODE BIOPSY: SHX2001

## 2021-06-06 HISTORY — PX: TOTAL MASTECTOMY: SHX6129

## 2021-06-06 HISTORY — PX: BREAST RECONSTRUCTION: SHX9

## 2021-06-06 LAB — POCT PREGNANCY, URINE: Preg Test, Ur: NEGATIVE

## 2021-06-06 SURGERY — MASTECTOMY WITH SENTINEL LYMPH NODE BIOPSY
Anesthesia: General | Site: Breast | Laterality: Right

## 2021-06-06 MED ORDER — PROPOFOL 10 MG/ML IV BOLUS
INTRAVENOUS | Status: DC | PRN
  Administered 2021-06-06: 200 mg via INTRAVENOUS

## 2021-06-06 MED ORDER — ROCURONIUM BROMIDE 10 MG/ML (PF) SYRINGE
PREFILLED_SYRINGE | INTRAVENOUS | Status: AC
  Filled 2021-06-06: qty 10

## 2021-06-06 MED ORDER — PROPOFOL 500 MG/50ML IV EMUL
INTRAVENOUS | Status: AC
  Filled 2021-06-06: qty 50

## 2021-06-06 MED ORDER — SCOPOLAMINE 1 MG/3DAYS TD PT72
MEDICATED_PATCH | TRANSDERMAL | Status: AC
  Filled 2021-06-06: qty 1

## 2021-06-06 MED ORDER — LIDOCAINE HCL (CARDIAC) PF 100 MG/5ML IV SOSY
PREFILLED_SYRINGE | INTRAVENOUS | Status: DC | PRN
  Administered 2021-06-06: 30 mg via INTRAVENOUS

## 2021-06-06 MED ORDER — CEFAZOLIN SODIUM-DEXTROSE 2-4 GM/100ML-% IV SOLN
INTRAVENOUS | Status: AC
  Filled 2021-06-06: qty 100

## 2021-06-06 MED ORDER — CHLORHEXIDINE GLUCONATE CLOTH 2 % EX PADS: 6.0000 | MEDICATED_PAD | Freq: Once | CUTANEOUS | Status: DC

## 2021-06-06 MED ORDER — LIDOCAINE 2% (20 MG/ML) 5 ML SYRINGE
INTRAMUSCULAR | Status: AC
  Filled 2021-06-06: qty 10

## 2021-06-06 MED ORDER — PHENYLEPHRINE 40 MCG/ML (10ML) SYRINGE FOR IV PUSH (FOR BLOOD PRESSURE SUPPORT)
PREFILLED_SYRINGE | INTRAVENOUS | Status: AC
  Filled 2021-06-06: qty 20

## 2021-06-06 MED ORDER — MIDAZOLAM HCL 2 MG/2ML IJ SOLN
INTRAMUSCULAR | Status: AC
  Filled 2021-06-06: qty 2

## 2021-06-06 MED ORDER — FENTANYL CITRATE (PF) 100 MCG/2ML IJ SOLN
INTRAMUSCULAR | Status: AC
  Filled 2021-06-06: qty 2

## 2021-06-06 MED ORDER — FENTANYL CITRATE (PF) 100 MCG/2ML IJ SOLN
INTRAMUSCULAR | Status: DC | PRN
  Administered 2021-06-06 (×3): 50 ug via INTRAVENOUS

## 2021-06-06 MED ORDER — EPINEPHRINE PF 1 MG/ML IJ SOLN
INTRAMUSCULAR | Status: AC
  Filled 2021-06-06: qty 1

## 2021-06-06 MED ORDER — CEFAZOLIN SODIUM-DEXTROSE 2-4 GM/100ML-% IV SOLN: 2.0000 g | INTRAVENOUS | Status: DC

## 2021-06-06 MED ORDER — KETOROLAC TROMETHAMINE 15 MG/ML IJ SOLN
15.0000 mg | INTRAMUSCULAR | Status: AC
  Administered 2021-06-06: 15 mg via INTRAVENOUS

## 2021-06-06 MED ORDER — ONDANSETRON HCL 4 MG/2ML IJ SOLN
4.0000 mg | Freq: Once | INTRAMUSCULAR | Status: DC | PRN
Start: 2021-06-06 — End: 2021-06-07

## 2021-06-06 MED ORDER — DEXAMETHASONE SODIUM PHOSPHATE 4 MG/ML IJ SOLN
INTRAMUSCULAR | Status: DC | PRN
  Administered 2021-06-06: 5 mg via INTRAVENOUS

## 2021-06-06 MED ORDER — MEPERIDINE HCL 25 MG/ML IJ SOLN: 6.2500 mg | INTRAMUSCULAR | Status: DC | PRN

## 2021-06-06 MED ORDER — SIMETHICONE 80 MG PO CHEW: 40.0000 mg | CHEWABLE_TABLET | Freq: Four times a day (QID) | ORAL | Status: DC | PRN

## 2021-06-06 MED ORDER — SUGAMMADEX SODIUM 200 MG/2ML IV SOLN
INTRAVENOUS | Status: DC | PRN
  Administered 2021-06-06: 200 mg via INTRAVENOUS

## 2021-06-06 MED ORDER — FENTANYL CITRATE (PF) 100 MCG/2ML IJ SOLN: 25.0000 ug | INTRAMUSCULAR | Status: DC | PRN

## 2021-06-06 MED ORDER — PHENYLEPHRINE HCL (PRESSORS) 10 MG/ML IV SOLN
INTRAVENOUS | Status: DC | PRN
  Administered 2021-06-06: 80 ug via INTRAVENOUS
  Administered 2021-06-06: 40 ug via INTRAVENOUS

## 2021-06-06 MED ORDER — DEXAMETHASONE SODIUM PHOSPHATE 10 MG/ML IJ SOLN
INTRAMUSCULAR | Status: AC
  Filled 2021-06-06: qty 1

## 2021-06-06 MED ORDER — ONDANSETRON HCL 4 MG/2ML IJ SOLN
INTRAMUSCULAR | Status: DC | PRN
  Administered 2021-06-06: 4 mg via INTRAVENOUS

## 2021-06-06 MED ORDER — CEFAZOLIN SODIUM-DEXTROSE 2-4 GM/100ML-% IV SOLN
2.0000 g | INTRAVENOUS | Status: AC
  Administered 2021-06-06: 2 g via INTRAVENOUS

## 2021-06-06 MED ORDER — BUPIVACAINE-EPINEPHRINE (PF) 0.25% -1:200000 IJ SOLN
INTRAMUSCULAR | Status: DC | PRN
  Administered 2021-06-06 (×2): 30 mL

## 2021-06-06 MED ORDER — OXYCODONE HCL 5 MG PO TABS
5.0000 mg | ORAL_TABLET | ORAL | Status: DC | PRN
  Administered 2021-06-06 – 2021-06-07 (×2): 5 mg via ORAL
  Filled 2021-06-06 (×2): qty 1

## 2021-06-06 MED ORDER — LIDOCAINE HCL (PF) 1 % IJ SOLN
INTRAMUSCULAR | Status: AC
  Filled 2021-06-06: qty 60

## 2021-06-06 MED ORDER — MIDAZOLAM HCL 5 MG/5ML IJ SOLN
INTRAMUSCULAR | Status: DC | PRN
  Administered 2021-06-06 (×2): 1 mg via INTRAVENOUS

## 2021-06-06 MED ORDER — ACETAMINOPHEN 500 MG PO TABS
1000.0000 mg | ORAL_TABLET | Freq: Four times a day (QID) | ORAL | Status: DC
  Administered 2021-06-06 – 2021-06-07 (×2): 1000 mg via ORAL
  Filled 2021-06-06 (×2): qty 2

## 2021-06-06 MED ORDER — MAGTRACE LYMPHATIC TRACER
INTRAMUSCULAR | Status: DC | PRN
  Administered 2021-06-06: 2 mL via INTRAMUSCULAR

## 2021-06-06 MED ORDER — ROCURONIUM BROMIDE 100 MG/10ML IV SOLN
INTRAVENOUS | Status: DC | PRN
  Administered 2021-06-06: 70 mg via INTRAVENOUS

## 2021-06-06 MED ORDER — ONDANSETRON HCL 4 MG/2ML IJ SOLN
INTRAMUSCULAR | Status: AC
  Filled 2021-06-06: qty 8

## 2021-06-06 MED ORDER — ENSURE PRE-SURGERY PO LIQD: 296.0000 mL | Freq: Once | ORAL | Status: DC

## 2021-06-06 MED ORDER — MORPHINE SULFATE (PF) 4 MG/ML IV SOLN
2.0000 mg | INTRAVENOUS | Status: DC | PRN
Start: 2021-06-06 — End: 2021-06-07

## 2021-06-06 MED ORDER — FENTANYL CITRATE (PF) 100 MCG/2ML IJ SOLN
100.0000 ug | Freq: Once | INTRAMUSCULAR | Status: AC
  Administered 2021-06-06: 100 ug via INTRAVENOUS

## 2021-06-06 MED ORDER — OXYCODONE HCL 5 MG/5ML PO SOLN: 5.0000 mg | Freq: Once | ORAL | Status: AC | PRN

## 2021-06-06 MED ORDER — MIDAZOLAM HCL 2 MG/2ML IJ SOLN
2.0000 mg | Freq: Once | INTRAMUSCULAR | Status: AC
  Administered 2021-06-06: 2 mg via INTRAVENOUS

## 2021-06-06 MED ORDER — METHOCARBAMOL 500 MG PO TABS
500.0000 mg | ORAL_TABLET | Freq: Four times a day (QID) | ORAL | Status: DC | PRN
  Administered 2021-06-06 – 2021-06-07 (×2): 500 mg via ORAL
  Filled 2021-06-06 (×2): qty 1

## 2021-06-06 MED ORDER — ONDANSETRON 4 MG PO TBDP: 4.0000 mg | ORAL_TABLET | Freq: Four times a day (QID) | ORAL | Status: DC | PRN

## 2021-06-06 MED ORDER — ACETAMINOPHEN 160 MG/5ML PO SOLN: 325.0000 mg | ORAL | Status: DC | PRN

## 2021-06-06 MED ORDER — ONDANSETRON HCL 4 MG/2ML IJ SOLN: 4.0000 mg | Freq: Four times a day (QID) | INTRAMUSCULAR | Status: DC | PRN

## 2021-06-06 MED ORDER — KETOROLAC TROMETHAMINE 15 MG/ML IJ SOLN
INTRAMUSCULAR | Status: AC
  Filled 2021-06-06: qty 1

## 2021-06-06 MED ORDER — SODIUM CHLORIDE 0.9 % IV SOLN: INTRAVENOUS | Status: DC

## 2021-06-06 MED ORDER — LACTATED RINGERS IV SOLN: INTRAVENOUS | Status: DC

## 2021-06-06 MED ORDER — ACETAMINOPHEN 500 MG PO TABS
ORAL_TABLET | ORAL | Status: AC
  Filled 2021-06-06: qty 2

## 2021-06-06 MED ORDER — ACETAMINOPHEN 325 MG PO TABS: 325.0000 mg | ORAL_TABLET | ORAL | Status: DC | PRN

## 2021-06-06 MED ORDER — PROPOFOL 500 MG/50ML IV EMUL
INTRAVENOUS | Status: DC | PRN
  Administered 2021-06-06: 50 ug/kg/min via INTRAVENOUS

## 2021-06-06 MED ORDER — ACETAMINOPHEN 500 MG PO TABS
1000.0000 mg | ORAL_TABLET | ORAL | Status: AC
  Administered 2021-06-06: 1000 mg via ORAL

## 2021-06-06 MED ORDER — SCOPOLAMINE 1 MG/3DAYS TD PT72
1.0000 | MEDICATED_PATCH | TRANSDERMAL | Status: DC
  Administered 2021-06-06: 1.5 mg via TRANSDERMAL

## 2021-06-06 MED ORDER — OXYCODONE HCL 5 MG PO TABS
5.0000 mg | ORAL_TABLET | Freq: Once | ORAL | Status: AC | PRN
  Administered 2021-06-06: 5 mg via ORAL
  Filled 2021-06-06: qty 1

## 2021-06-06 SURGICAL SUPPLY — 76 items
ADH SKN CLS APL DERMABOND .7 (GAUZE/BANDAGES/DRESSINGS) ×10
APL PRP STRL LF DISP 70% ISPRP (MISCELLANEOUS) ×10
APL SKNCLS STERI-STRIP NONHPOA (GAUZE/BANDAGES/DRESSINGS)
APPLIER CLIP 11 MED OPEN (CLIP)
APPLIER CLIP 9.375 MED OPEN (MISCELLANEOUS) ×12
APR CLP MED 11 20 MLT OPN (CLIP)
APR CLP MED 9.3 20 MLT OPN (MISCELLANEOUS) ×10
BENZOIN TINCTURE PRP APPL 2/3 (GAUZE/BANDAGES/DRESSINGS) IMPLANT
BINDER BREAST LRG (GAUZE/BANDAGES/DRESSINGS) IMPLANT
BINDER BREAST MEDIUM (GAUZE/BANDAGES/DRESSINGS) IMPLANT
BINDER BREAST XLRG (GAUZE/BANDAGES/DRESSINGS) ×2 IMPLANT
BINDER BREAST XXLRG (GAUZE/BANDAGES/DRESSINGS) IMPLANT
BIOPATCH RED 1 DISK 7.0 (GAUZE/BANDAGES/DRESSINGS) ×4 IMPLANT
BLADE HEX COATED 2.75 (ELECTRODE) ×4 IMPLANT
BLADE SURG 10 STRL SS (BLADE) ×12 IMPLANT
BLADE SURG 15 STRL LF DISP TIS (BLADE) ×5 IMPLANT
BLADE SURG 15 STRL SS (BLADE) ×6
BNDG GAUZE ELAST 4 BULKY (GAUZE/BANDAGES/DRESSINGS) ×8 IMPLANT
CANISTER SUCT 1200ML W/VALVE (MISCELLANEOUS) ×6 IMPLANT
CHLORAPREP W/TINT 26 (MISCELLANEOUS) ×12 IMPLANT
CLIP APPLIE 11 MED OPEN (CLIP) IMPLANT
CLIP APPLIE 9.375 MED OPEN (MISCELLANEOUS) ×2 IMPLANT
COVER BACK TABLE 60X90IN (DRAPES) ×6 IMPLANT
COVER MAYO STAND STRL (DRAPES) ×6 IMPLANT
COVER PROBE W GEL 5X96 (DRAPES) ×8 IMPLANT
DERMABOND ADVANCED (GAUZE/BANDAGES/DRESSINGS) ×2
DERMABOND ADVANCED .7 DNX12 (GAUZE/BANDAGES/DRESSINGS) ×10 IMPLANT
DRAIN CHANNEL 19F RND (DRAIN) ×12 IMPLANT
DRAPE LAPAROSCOPIC ABDOMINAL (DRAPES) ×6 IMPLANT
DRAPE UTILITY XL STRL (DRAPES) ×6 IMPLANT
DRSG OPSITE POSTOP 4X10 (GAUZE/BANDAGES/DRESSINGS) IMPLANT
DRSG OPSITE POSTOP 4X12 (GAUZE/BANDAGES/DRESSINGS) ×4 IMPLANT
DRSG PAD ABDOMINAL 8X10 ST (GAUZE/BANDAGES/DRESSINGS) ×12 IMPLANT
DRSG TEGADERM 2-3/8X2-3/4 SM (GAUZE/BANDAGES/DRESSINGS) ×4 IMPLANT
DRSG TEGADERM 4X4.75 (GAUZE/BANDAGES/DRESSINGS) IMPLANT
ELECT BLADE 4.0 EZ CLEAN MEGAD (MISCELLANEOUS) ×6
ELECT COATED BLADE 2.86 ST (ELECTRODE) ×2 IMPLANT
ELECT REM PT RETURN 9FT ADLT (ELECTROSURGICAL) ×6
ELECTRODE BLDE 4.0 EZ CLN MEGD (MISCELLANEOUS) ×1 IMPLANT
ELECTRODE REM PT RTRN 9FT ADLT (ELECTROSURGICAL) ×5 IMPLANT
EVACUATOR SILICONE 100CC (DRAIN) ×12 IMPLANT
GAUZE SPONGE 4X4 12PLY STRL LF (GAUZE/BANDAGES/DRESSINGS) ×2 IMPLANT
GLOVE SURG ENC MOIS LTX SZ6.5 (GLOVE) ×16 IMPLANT
GLOVE SURG ENC MOIS LTX SZ7 (GLOVE) ×8 IMPLANT
GLOVE SURG ENC MOIS LTX SZ7.5 (GLOVE) ×6 IMPLANT
GLOVE SURG POLYISO LF SZ8 (GLOVE) ×6 IMPLANT
GLOVE SURG UNDER POLY LF SZ7 (GLOVE) ×2 IMPLANT
GOWN STRL REUS W/ TWL LRG LVL3 (GOWN DISPOSABLE) ×15 IMPLANT
GOWN STRL REUS W/TWL 2XL LVL3 (GOWN DISPOSABLE) ×2 IMPLANT
GOWN STRL REUS W/TWL LRG LVL3 (GOWN DISPOSABLE) ×18
NDL HYPO 25X1 1.5 SAFETY (NEEDLE) ×4 IMPLANT
NEEDLE HYPO 25X1 1.5 SAFETY (NEEDLE) IMPLANT
NS IRRIG 1000ML POUR BTL (IV SOLUTION) ×6 IMPLANT
PACK BASIN DAY SURGERY FS (CUSTOM PROCEDURE TRAY) ×6 IMPLANT
PENCIL SMOKE EVACUATOR (MISCELLANEOUS) ×6 IMPLANT
PIN SAFETY STERILE (MISCELLANEOUS) ×6 IMPLANT
SLEEVE SCD COMPRESS KNEE MED (STOCKING) ×6 IMPLANT
SPONGE T-LAP 18X18 ~~LOC~~+RFID (SPONGE) ×16 IMPLANT
STRIP CLOSURE SKIN 1/2X4 (GAUZE/BANDAGES/DRESSINGS) IMPLANT
STRIP SUTURE WOUND CLOSURE 1/2 (MISCELLANEOUS) ×12 IMPLANT
SUT MNCRL AB 4-0 PS2 18 (SUTURE) ×14 IMPLANT
SUT MON AB 3-0 SH 27 (SUTURE) ×18
SUT MON AB 3-0 SH27 (SUTURE) ×11 IMPLANT
SUT MON AB 5-0 PS2 18 (SUTURE) ×8 IMPLANT
SUT PDS 3-0 CT2 (SUTURE) ×18
SUT PDS II 3-0 CT2 27 ABS (SUTURE) ×27 IMPLANT
SUT SILK 2 0 SH (SUTURE) ×2 IMPLANT
SUT SILK 3 0 PS 1 (SUTURE) ×4 IMPLANT
SUT VIC AB 2-0 SH 27 (SUTURE) ×12
SUT VIC AB 2-0 SH 27XBRD (SUTURE) ×2 IMPLANT
SYR BULB IRRIG 60ML STRL (SYRINGE) ×6 IMPLANT
SYR CONTROL 10ML LL (SYRINGE) ×4 IMPLANT
TOWEL GREEN STERILE FF (TOWEL DISPOSABLE) ×12 IMPLANT
TRACER MAGTRACE VIAL (MISCELLANEOUS) ×2 IMPLANT
UNDERPAD 30X36 HEAVY ABSORB (UNDERPADS AND DIAPERS) ×12 IMPLANT
YANKAUER SUCT BULB TIP NO VENT (SUCTIONS) ×6 IMPLANT

## 2021-06-06 NOTE — Transfer of Care (Signed)
Immediate Anesthesia Transfer of Care Note  Patient: Shannon Hale  Procedure(s) Performed: RIGHT MASTECTOMY WITH AXILLARY SENTINEL LYMPH NODE BIOPSY (Right: Breast) RADIOACTIVE SEED GUIDED RIGHT AXILLARY SENTINEL LYMPH NODE EXCISION (Right: Axilla) LEFT TOTAL MASTECTOMY (Left: Breast) BILATERAL BREAST RECONSTRUCTION WITH TISSUE REARRANGEMENT (Bilateral: Breast)  Patient Location: PACU  Anesthesia Type:GA combined with regional for post-op pain  Level of Consciousness: awake, alert , oriented and patient cooperative  Airway & Oxygen Therapy: Patient Spontanous Breathing and Patient connected to face mask oxygen  Post-op Assessment: Report given to RN and Post -op Vital signs reviewed and stable  Post vital signs: Reviewed and stable  Last Vitals:  Vitals Value Taken Time  BP 104/62 06/06/21 1306  Temp    Pulse 83 06/06/21 1307  Resp 16 06/06/21 1307  SpO2 100 % 06/06/21 1307  Vitals shown include unvalidated device data.  Last Pain:  Vitals:   06/06/21 1003  TempSrc: Oral  PainSc: 0-No pain      Patients Stated Pain Goal: 7 (28/36/62 [id number])  Complications: No notable events documented.

## 2021-06-06 NOTE — Anesthesia Procedure Notes (Signed)
Anesthesia Regional Block: Pectoralis block   Pre-Anesthetic Checklist: , timeout performed,  Correct Patient, Correct Site, Correct Laterality,  Correct Procedure, Correct Position, site marked,  Risks and benefits discussed,  Surgical consent,  Pre-op evaluation,  At surgeon's request and post-op pain management  Laterality: Left and Right  Prep: chloraprep       Needles:  Injection technique: Single-shot  Needle Type: Echogenic Stimulator Needle     Needle Length: 5cm  Needle Gauge: 22     Additional Needles:   Procedures:, nerve stimulator,,, ultrasound used (permanent image in chart),,    Narrative:  Start time: 06/06/2021 10:00 AM End time: 06/06/2021 10:15 AM Injection made incrementally with aspirations every 5 mL.  Performed by: Personally  Anesthesiologist: Janeece Riggers, MD  Additional Notes: Functioning IV was confirmed and monitors were applied.  A 54mm 22ga Arrow echogenic stimulator needle was used. Sterile prep and drape,hand hygiene and sterile gloves were used. Ultrasound guidance: relevant anatomy identified, needle position confirmed, local anesthetic spread visualized around nerve(s)., vascular puncture avoided.  Image printed for medical record. Negative aspiration and negative test dose prior to incremental administration of local anesthetic. The patient tolerated the procedure well.

## 2021-06-06 NOTE — Discharge Instructions (Addendum)
INSTRUCTIONS FOR AFTER SURGERY   You will likely have some questions about what to expect following your operation.  The following information will help you and your family understand what to expect when you are discharged from the hospital.  Following these guidelines will help ensure a smooth recovery and reduce risks of complications.  Postoperative instructions include information on: diet, wound care, medications and physical activity.  AFTER SURGERY Expect to go home after the procedure.  In some cases, you may need to spend one night in the hospital for observation.  DIET This surgery does not require a specific diet.  However, I have to mention that the healthier you eat the better your body can start healing. It is important to increasing your protein intake.  This means limiting the foods with added sugar.  Focus on fruits and vegetables and some meat. It is very important to drink water after your surgery.  If your urine is bright yellow, then it is concentrated, and you need to drink more water.  As a general rule after surgery, you should have 8 ounces of water every hour while awake.  If you find you are persistently nauseated or unable to take in liquids let us know.  NO TOBACCO USE or EXPOSURE.  This will slow your healing process and increase the risk of a wound.  WOUND CARE Clean with baby wipes for 3-5 days and then you can shower.  If you have a binder you may remove it to shower and then put it back on. If you have steri-strips / tape directly attached to your skin leave them in place. It is OK to get these wet.  No baths, pools or hot tubs for two weeks. We close your incision to leave the smallest and best-looking scar. No ointment or creams on your incisions until given the go ahead.  Especially not Neosporin (Too many skin reactions with this one).  A few weeks after surgery you can use Mederma and start massaging the scar. We ask you to wear your binder or sports bra for the  first 6 weeks around the clock, including while sleeping. This provides added comfort and helps reduce the fluid accumulation at the surgery site.  ACTIVITY No heavy lifting until cleared by the doctor.  It is OK to walk and climb stairs. In fact, moving your legs is very important to decrease your risk of a blood clot.  It will also help keep you from getting deconditioned.  Every 1 to 2 hours get up and walk for 5 minutes. This will help with a quicker recovery back to normal.  Let pain be your guide so you don't do too much.  NO, you cannot do the spring cleaning and don't plan on taking care of anyone else.  This is your time for TLC.   WORK Everyone returns to work at different times. As a rough guide, most people take at least 1 - 2 weeks off prior to returning to work. If you need documentation for your job, bring the forms to your postoperative follow up visit.  DRIVING Arrange for someone to bring you home from the hospital.  You may be able to drive a few days after surgery but not while taking any narcotics or valium.  BOWEL MOVEMENTS Constipation can occur after anesthesia and while taking pain medication.  It is important to stay ahead for your comfort.  We recommend taking Milk of Magnesia (2 tablespoons; twice a day) while taking the  pain pills.  SEROMA This is fluid your body tried to put in the surgical site.  This is normal but if it creates excessive pain and swelling let us know.  It usually decreases in a few weeks.  MEDICATIONS and PAIN CONTROL At your preoperative visit for you history and physical you were given the following medications: An antibiotic: Start this medication when you get home and take according to the instructions on the bottle. Zofran 4 mg:  This is to treat nausea and vomiting.  You can take this every 6 hours as needed and only if needed. Norco (hydrocodone/acetaminophen) 5/325 mg:  This is only to be used after you have taken the motrin or the  tylenol. Every 8 hours as needed. Over the counter Medication to take: Ibuprofen (Motrin) 600 mg:  Take this every 6 hours.  If you have additional pain then take 500 mg of the tylenol.  Only take the Norco after you have tried these two. Miralax or stool softener of choice: Take this according to the bottle if you take the [place] Call your surgeon's office if any of the following occur:  Fever 101 degrees F or greater  Excessive bleeding or fluid from the incision site.  Pain that increases over time without aid from the medications  Redness, warmth, or pus draining from incision sites  Persistent nausea or inability to take in liquids  Severe misshapen area that underwent the operation.  [facility] Specialist  What is the benefit of having a drain?  During surgery your tissue layers are separated.  This raw surface stimulates your body to fill the space with serous fluid.  This is normal but you don't want that fluid to collect and prevent healing.  A fluid collection can also become infected.  The Jackson-Pratt (JP) drain is used to eliminate this collection of fluid and allow the tissue to heal together.    Jackson-Pratt (JP) bulb    How to care for your drainage and suction unit at home Your drainage catheter will be connected to a collection device. The vacuum caused when the device is compressed allows drainage to collect in the device.    Wash your hands with soap and water before and after touching the system. Empty the JP drain every 12 hours once you get home from your procedure. Record the fluid amount on the record sheet included. Start with stripping the drain tube to push the clots or excess fluid to the bulb.  Do this by pinching the tube with one hand near your skin.  Then with the other hand squeeze the tubing and work it toward the bulb.  This should be done several times a day.  This may collapse the tube which will correct on its own.   Use a  safety pin to attach your collection device to your clothing so there is no tension on the insertion site.   If you have drainage at the skin insertion site, you can apply a gauze dressing and secure it with tape. If the drain falls out, apply a gauze dressing over the drain insertion site and secure with tape.   To empty the collection device:   Release the stopper on the top of the collection unit (bulb).  Pour contents into a measuring container such as a plastic medicine cup.  Record the day and amount of drainage on the attached sheet. This should be done at least twice a day.  To compress the Jackson-Pratt Bulb:  Release the stopper at the top of the bulb. Squeeze the bulb tightly in your fist, squeezing air out of the bulb.  Replace the stopper while the bulb is compressed.  Be careful not to spill the contents when squeezing the bulb. The drainage will start bright red and turn to pink and then yellow with time. IMPORTANT: If the bulb is not squeezed before adding the stopper it will not draw out the fluid.  Care for the JP drain site and your skin daily:  You may shower three days after surgery. Secure the drain to a ribbon or cloth around your waist while showering so it does not pull out while showering. Be sure your hands are cleaned with soap and water. Use a clean wet cotton swab to clean the skin around the drain site.  Use another cotton swab to place Vaseline or antibiotic ointment on the skin around the drain.     Contact your physician if any of the following occur:  The fluid in the bulb becomes cloudy. Your temperature is greater than 101.4.  The incision opens. If you have drainage at the skin insertion site, you can apply a gauze dressing and secure it with tape. If the drain falls out, apply a gauze dressing over the drain insertion site and secure with tape.  You will usually have more drainage when you are active than while you rest or are asleep. If the  drainage increases significantly or is bloody call the physician                             Bring this record with you to each office visit Date  Drainage Volume  Date   Drainage volume                                                                                                                                                                                         [facility], [place] [phone]  MASTECTOMY: POST OP INSTRUCTIONS Take 400 mg of ibuprofen every 8 hours or 650 mg tylenol every 6 hours for next 72 hours then as needed. Use ice several times daily also. Always review your discharge instruction sheet given to you by the facility where your surgery was performed. IF YOU HAVE DISABILITY OR FAMILY LEAVE FORMS, YOU MUST BRING THEM TO THE OFFICE FOR PROCESSING.   DO NOT GIVE THEM TO YOUR DOCTOR. A prescription for pain medication may be given to you upon discharge.  Take your pain medication as prescribed, if needed.  If narcotic pain  medicine is not needed, then you may take acetaminophen (Tylenol), naprosyn (Alleve) or ibuprofen (Advil) as needed. Take your usually prescribed medications unless otherwise directed. If you need a refill on your pain medication, please contact your pharmacy.  They will contact our office to request authorization.  Prescriptions will not be filled after 5pm or on week-ends. You should follow a light diet the first few days after arrival home, such as soup and crackers, etc.  Resume your normal diet the day after surgery. Most patients will experience some swelling and bruising on the chest and underarm.  Ice packs will help.  Swelling and bruising can take several days to resolve. Wear the binder day and night until you return to the office.  It is common to experience some constipation if taking pain medication after surgery.  Increasing fluid intake and taking a stool softener (such as Colace) will usually help or  prevent this problem from occurring.  A mild laxative (Milk of Magnesia or Miralax) should be taken according to package instructions if there are no bowel movements after 48 hours. Unless discharge instructions indicate otherwise, leave your bandage dry and in place until your next appointment in 3-5 days.  You may take a limited sponge bath.  No tube baths or showers until the drains are removed.  You may have steri-strips (small skin tapes) in place directly over the incision.  These strips should be left on the skin for 7-10 days. If you have glue it will come off in next couple week.  Any sutures will be removed at an office visit DRAINS:  If you have drains in place, it is important to keep a list of the amount of drainage produced each day in your drains.  Before leaving the hospital, you should be instructed on drain care.  Call our office if you have any questions about your drains. I will remove your drains when they put out less than 30 cc or ml for 2 consecutive days. ACTIVITIES:  You may resume regular (light) daily activities beginning the next day--such as daily self-care, walking, climbing stairs--gradually increasing activities as tolerated.  You may have sexual intercourse when it is comfortable.  Refrain from any heavy lifting or straining until approved by your doctor. You may drive when you are no longer taking prescription pain medication, you can comfortably wear a seatbelt, and you can safely maneuver your car and apply brakes. RETURN TO WORK:  __________________________________________________________ Dennis Bast should see your doctor in the office for a follow-up appointment approximately 3-5 days after your surgery.  Your doctor's nurse will typically make your follow-up appointment when she calls you with your pathology report.  Expect your pathology report 3-4business days after surgery. OTHER INSTRUCTIONS:  ______________________________________________________________________________________________ ____________________________________________________________________________________________ WHEN TO CALL YOUR DR Laurence Crofford: Fever over 101.0 Nausea and/or vomiting Extreme swelling or bruising Continued bleeding from incision. Increased pain, redness, or drainage from the incision. The clinic staff is available to answer your questions during regular business hours.  Please don't hesitate to call and ask to speak to one of the nurses for clinical concerns.  If you have a medical emergency, go to the nearest emergency room or call 911.  A surgeon from [facility] is always on call at the hospital. [address], [place], [place], [place]  [address] ? [address] [place] [address], [place],    [address] [phone] ? [phone] ? FAX [phone] Web site: [email or web address]

## 2021-06-06 NOTE — Op Note (Signed)
Preoperative diagnosis: Clinical stage II right breast cancer, high risk Postoperative diagnosis: Same as above Procedure: 1.  Left risk reducing mastectomy 2.  Right mastectomy 3.  Injection of mag trace for sentinel lymph node identification 4.  Right deep axillary sentinel lymph node biopsy 5.  Right radioactive seed guided axillary lymph node excision Surgeon: Dr. Serita Grammes Anesthesia: General with bilateral pectoral blocks Estimated blood loss: 50 cc Specimens: 1.  Left breast tissue marked short superior, long lateral 2.  Right breast tissue marked short superior, long lateral 3.  Right axillary seed containing node, this was also a sentinel node 4.  Additional right axillary sentinel lymph nodes Complications: None Drains both Per plastic surgery Sponge and count was correct at the end of my portion Case turned over to plastic surgery for closure of  Indications: This a 40 year old female who had a right breast mass noted on a screening exam.  On ultrasound and mammogram she had a 2.6 cm retroareolar right breast mass.  She has a single abnormal right axillary node measuring 1.2 cm.  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  We have discussed all of her options and she elected to have bilateral mastectomies with a targeted axillary lymph node dissection on the right followed by flap closure.  Procedure: After informed consent was obtained she first underwent a pectoral block.  She was marked by plastics for the orientation of the incisions.  I then prepped the area around her right areola.  I injected 2 cc of mag trace in the subareolar position.  This was done for later sentinel lymph node biopsy.  She was then given antibiotics.  SCDs were in place.  She was placed under general anesthesia without complication.  She was prepped and draped in the standard sterile surgical fashion.  A surgical timeout was then  performed.  I performed a risk reducing mastectomy on the left first.  I made incision in the reduction pattern.  I carried the flaps to the clavicle, parasternal area, inframammary fold, latissimus laterally.  I then remove the breast tissue and the pectoralis facet fascia from the muscle and marked it as above.  Hemostasis was obtained.  I then packed this and moved to the other side.  I made a similar incision on the right side.  I created the flaps and remove the breast and the fascia in a similar fashion.  I marked it as above.  I then entered into the axilla.  With the neoprobe I was able to identify the seed containing node.  I excised this.  Mammogram confirmed removal of the seed.  This was also a sentinel node.  There appeared to be another small node at least attached to this as well.  There was some background activity with the center mag probe so I removed some additional tissue that had low-level activity.  Upon completion there were no palpable nodes and no additional activity present.  Hemostasis was obtained.  I then packed this and turned the case over to plastic surgery.

## 2021-06-06 NOTE — Progress Notes (Signed)
Assisted Dr. Ambrose Pancoast with right, left, ultrasound guided, pectoralis blocks. Side rails up, monitors on throughout procedure. See vital signs in flow sheet. Tolerated Procedure well.

## 2021-06-06 NOTE — H&P (Signed)
Shannon Hale is an 40 y.o. female.   Chief Complaint: breast cancer HPI: The patient is a 41 yrs old female here for bilateral mastectomies for treatment for breast cancer.  She decided on no reconstruction.  She is otherwise healthy.  Her sternal notch to nipple is 30 cm and preop bra size is 38DDD.  Past Medical History:  Diagnosis Date   Abnormal Pap smear    Headache(784.0)    migraines   Hepatitis 03/2004   unknown form of hepatitis, no current liver problems   History of chicken pox    History of D&C    D&E   Migraine    Nausea    PONV (postoperative nausea and vomiting)    Vaginal Pap smear, abnormal     Past Surgical History:  Procedure Laterality Date   CHOLECYSTECTOMY N/A 12/01/2013   Procedure: LAPAROSCOPIC CHOLECYSTECTOMY ;  Surgeon: Harl Bowie, MD;  Location: [facility];  Service: General;  Laterality: N/A;   DILATION AND EVACUATION N/A 07/14/2014   Procedure: DILATATION AND EVACUATION;  Surgeon: Marylynn Pearson, MD;  Location: [facility];  Service: Gynecology;  Laterality: N/A;   SEPTOPLASTY     TONSILLECTOMY     WISDOM TOOTH EXTRACTION      Family History  Problem Relation Age of Onset   Cancer Mother        breast, leaukemia-Living   Angina Father    Hypertension Maternal Grandmother    Diabetes Maternal Grandmother    Depression Maternal Grandmother        anxiety also   Cancer Maternal Grandmother        breast   Diabetes Paternal Grandmother    Heart disease Paternal Grandfather    Cancer Paternal Grandfather        colon   Cancer Maternal Aunt        cervical   Seizures Brother        Resolved   Diabetes Paternal Uncle    Healthy Brother        x2   Healthy Son        x1   Social History:  reports that she quit smoking about 18 years ago. Her smoking use included cigarettes. She has a 0.13 pack-year smoking history. She has never used smokeless tobacco. She reports that she does not drink alcohol and does not use drugs.  Allergies: No  Known Allergies  No medications prior to admission.    No results found for this or any previous visit (from the past 48 hour(s)). [place] RT RADIOACTIVE SEED LOC  Result Date: 06/05/2021 CLINICAL DATA:  Patient with RIGHT axillary lymph node metastasis scheduled for targeted lymph node dissection requiring preoperative radioactive seed localization. EXAM: ULTRASOUND GUIDED RADIOACTIVE SEED LOCALIZATION OF THE RIGHT AXILLA COMPARISON:  Previous exam(s). FINDINGS: Patient presents for radioactive seed localization prior to targeted lymph node dissection. I met with the patient and we discussed the procedure of seed localization including benefits and alternatives. We discussed the high likelihood of a successful procedure. We discussed the risks of the procedure including infection, bleeding, tissue injury and further surgery. We discussed the low dose of radioactivity involved in the procedure. Informed, written consent was given. The usual time-out protocol was performed immediately prior to the procedure. Using ultrasound guidance, sterile technique, 1% lidocaine and an I-125 radioactive seed, the enlarged lymph node in the RIGHT axilla was localized using a inferolateral approach. The follow-up mammogram images confirm the seed in the expected location and were  marked for Dr. Donne Hazel. Follow-up survey of the patient confirms presence of the radioactive seed. Order number of I-125 seed:  [id number]. Total activity:  5.830 millicuries reference Date: 05/17/2021 The patient tolerated the procedure well and was released from the [facility]. She was given instructions regarding seed removal. IMPRESSION: Radioactive seed localization right breast. No apparent complications. Electronically Signed   By: Franki Cabot M.D.   On: 06/05/2021 15:08  MM CLIP PLACEMENT RIGHT  Result Date: 06/05/2021 CLINICAL DATA:  Status post ultrasound-guided radioactive seed localization of an enlarged lymph node in the RIGHT  axilla. EXAM: DIAGNOSTIC RIGHT MAMMOGRAM POST ULTRASOUND-GUIDED RADIOACTIVE SEED PLACEMENT COMPARISON:  Previous exam(s). FINDINGS: Mammographic images were obtained following ultrasound-guided radioactive seed placement. These demonstrate the radioactive seed within the enlarged lymph node of the RIGHT axilla. The previously placed biopsy clip (Tri-Bell) is slightly below the biopsied lymph node. IMPRESSION: Appropriate location of the radioactive seed within the enlarged lymph node in the RIGHT axilla. Please note that the biopsy marking clip placed at the time of biopsy is located slightly below the abnormal-appearing lymph node. Final Assessment: Post Procedure Mammograms for Seed Placement Electronically Signed   By: Franki Cabot M.D.   On: 06/05/2021 15:22   Review of Systems  Constitutional: Negative.   HENT: Negative.    Eyes: Negative.   Respiratory: Negative.    Cardiovascular: Negative.   Gastrointestinal: Negative.   Endocrine: Negative.   Genitourinary: Negative.   Musculoskeletal: Negative.   Neurological: Negative.   Hematological: Negative.   Psychiatric/Behavioral: Negative.     Height _0  (1.753 m), weight 88.5 kg, last menstrual period 05/22/2021, unknown if currently breastfeeding. Physical Exam Vitals and nursing note reviewed.  Constitutional:      Appearance: Normal appearance.  HENT:     Head: Normocephalic and atraumatic.  Cardiovascular:     Rate and Rhythm: Normal rate.     Pulses: Normal pulses.  Pulmonary:     Effort: Pulmonary effort is normal.  Abdominal:     General: Abdomen is flat.  Musculoskeletal:        General: No swelling or deformity.  Skin:    General: Skin is warm.     Capillary Refill: Capillary refill takes less than 2 seconds.     Coloration: Skin is not jaundiced.     Findings: No bruising.  Neurological:     General: No focal deficit present.     Mental Status: She is alert and oriented to person, place, and time.  Psychiatric:         Mood and Affect: Mood normal.        Behavior: Behavior normal.        Thought Content: Thought content normal.     Assessment/Plan Plan for aesthetic closure of mastectomy flaps.   [place], DO 06/06/2021, 8:36 AM

## 2021-06-06 NOTE — Anesthesia Preprocedure Evaluation (Signed)
Anesthesia Evaluation  Patient identified by MRN, date of birth, ID band Patient awake    Reviewed: Allergy & Precautions, H&P , NPO status , Patient's Chart, lab work & pertinent test results, reviewed documented beta blocker date and time   History of Anesthesia Complications (+) PONV and history of anesthetic complications  Airway Mallampati: II  TM Distance: >3 FB Neck ROM: full    Dental no notable dental hx.    Pulmonary neg pulmonary ROS, former smoker,    Pulmonary exam normal breath sounds clear to auscultation       Cardiovascular Exercise Tolerance: Good negative cardio ROS   Rhythm:regular Rate:Normal     Neuro/Psych  Headaches, negative psych ROS   GI/Hepatic negative GI ROS, (+) Hepatitis -RECOVERED    Endo/Other  negative endocrine ROS  Renal/GU negative Renal ROS  negative genitourinary   Musculoskeletal   Abdominal   Peds  Hematology negative hematology ROS (+)   Anesthesia Other Findings   Reproductive/Obstetrics negative OB ROS                             Anesthesia Physical Anesthesia Plan  ASA: 2  Anesthesia Plan: General   Post-op Pain Management: GA combined w/ Regional for post-op pain   Induction: Intravenous  PONV Risk Score and Plan: 3 and Ondansetron, Dexamethasone, Midazolam, Treatment may vary due to age or medical condition, Scopolamine patch - Pre-op and Propofol infusion  Airway Management Planned: Oral ETT and LMA  Additional Equipment: None  Intra-op Plan:   Post-operative Plan: Extubation in OR  Informed Consent: I have reviewed the patients History and Physical, chart, labs and discussed the procedure including the risks, benefits and alternatives for the proposed anesthesia with the patient or authorized representative who has indicated his/her understanding and acceptance.     Dental Advisory Given  Plan Discussed with: CRNA and  Anesthesiologist  Anesthesia Plan Comments: (Discussed both nerve block for pain relief post-op and GA; including NV, sore throat, dental injury, and pulmonary complications)        Anesthesia Quick Evaluation

## 2021-06-06 NOTE — Anesthesia Procedure Notes (Signed)
Procedure Name: Intubation Date/Time: 06/06/2021 10:34 AM Performed by: Signe Colt, CRNA Pre-anesthesia Checklist: Patient identified, Emergency Drugs available, Suction available and Patient being monitored Patient Re-evaluated:Patient Re-evaluated prior to induction Oxygen Delivery Method: Circle system utilized Preoxygenation: Pre-oxygenation with 100% oxygen Induction Type: IV induction Ventilation: Mask ventilation without difficulty Laryngoscope Size: Mac and 3 Grade View: Grade I Tube type: Oral Tube size: 7.0 mm Number of attempts: 1 Airway Equipment and Method: Stylet and Oral airway Placement Confirmation: ETT inserted through vocal cords under direct vision, positive ETCO2 and breath sounds checked- equal and bilateral Secured at: 21 cm Tube secured with: Tape Dental Injury: Teeth and Oropharynx as per pre-operative assessment

## 2021-06-06 NOTE — Interval H&P Note (Signed)
History and Physical Interval Note:  06/06/2021 9:56 AM  Shannon Hale  has presented today for surgery, with the diagnosis of RIGHT BREAST CANCER.  The various methods of treatment have been discussed with the patient and family. After consideration of risks, benefits and other options for treatment, the patient has consented to  Procedure(s) with comments: RIGHT MASTECTOMY WITH AXILLARY SENTINEL LYMPH NODE BIOPSY (Right) - 120 MINUTES ROOM 2 RADIOACTIVE SEED GUIDED RIGHT AXILLARY SENTINEL LYMPH NODE EXCISION (Right) LEFT TOTAL MASTECTOMY (Left) BILATERAL ONCOPLASTIC BREAST REDUCTION WITH LIPOSUCTION (Bilateral) as a surgical intervention.  The patient's history has been reviewed, patient examined, no change in status, stable for surgery.  I have reviewed the patient's chart and labs.  Questions were answered to the patient's satisfaction.     Rolm Bookbinder

## 2021-06-06 NOTE — Anesthesia Postprocedure Evaluation (Signed)
Anesthesia Post Note  Patient: ALYNN ELLITHORPE  Procedure(s) Performed: RIGHT MASTECTOMY WITH AXILLARY SENTINEL LYMPH NODE BIOPSY (Right: Breast) RADIOACTIVE SEED GUIDED RIGHT AXILLARY SENTINEL LYMPH NODE EXCISION (Right: Axilla) LEFT TOTAL MASTECTOMY (Left: Breast) BILATERAL BREAST RECONSTRUCTION WITH TISSUE REARRANGEMENT (Bilateral: Breast)     Patient location during evaluation: PACU Anesthesia Type: General Level of consciousness: awake and alert Pain management: pain level controlled Vital Signs Assessment: post-procedure vital signs reviewed and stable Respiratory status: spontaneous breathing, nonlabored ventilation, respiratory function stable and patient connected to nasal cannula oxygen Cardiovascular status: blood pressure returned to baseline and stable Postop Assessment: no apparent nausea or vomiting Anesthetic complications: no   No notable events documented.  Last Vitals:  Vitals:   06/06/21 1420 06/06/21 1600  BP: 90/61   Pulse: 67 87  Resp: 16 18  Temp: 36.5 C   SpO2: 98% 98%    Last Pain:  Vitals:   06/06/21 1600  TempSrc:   PainSc: 4                  Erva Koke

## 2021-06-06 NOTE — H&P (Signed)
41 y.o. female who is seen today as an office consultation at the request of Dr. Julien Girt for evaluation of a new right breast cancer. She has a family history of breast cancer in her mom, maternal grandmother, maternal great grandmother and multiple maternal aunts. Her mom is recently deceased of lymphoma. She was diagnosed at age 64. Her mom had genetic testing in 2009 that was negative. Patient has no discharge. She was noted on a screening exam by her gynecologist to have a right breast mass. She done under has undergone evaluation that shows C density breast. On ultrasound and mammogram she has a 2.6 x 2.4 x 2 cm retroareolar right breast mass. There is a single abnormal right axillary node measuring 1.2 cm. Biopsy of the node is positive. 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   Review of Systems: A complete review of systems was obtained from the patient. I have reviewed this information and discussed as appropriate with the patient. See HPI as well for other ROS.  Review of Systems  All other systems reviewed and are negative.   Medical History: Past Medical History:  Diagnosis Date   Headache   Hepatitis 03/2004  unknown form of hepatitis, no current liver problems   History of chicken pox   Hx of abnormal cervical Pap smear   PONV (postoperative nausea and vomiting)   Patient Active Problem List  Diagnosis   Breast cancer metastasized to axillary lymph node, right (CMS-HCC)   Past Surgical History:  Procedure Laterality Date   CHOLECYSTECTOMY 12/01/2013   dilation and evacuation 07/14/2014   SEPTOPLASTY   TONSILLECTOMY   wisdom tooth extraction    No Known Allergies  Current Outpatient Medications on File Prior to Visit  Medication Sig Dispense Refill   acetylcysteine (N-ACETYL-L-CYSTEINE MISC) Use once daily   APPLE CIDER VINEGAR ORAL Take 450 mg by mouth  once daily   ascorbic acid, vitamin C, (VITAMIN C) 1000 MG tablet Take 1,000 mg by mouth once daily With Bio-Quercetin Phytosome 15 mg   drospirenone-ethinyl estradioL (YASMIN) 3-0.03 mg tablet Ocella 3 mg-0.03 mg tablet   magnesium 250 mg Tab Take by mouth once daily   vitamins A,C,and E-selenium Cap Take by mouth once daily Super Selenium Complex 200 mg & Vitamin E 20 mg   zinc 50 mg Tab Take by mouth once daily   No current facility-administered medications on file prior to visit.   Family History  Problem Relation Age of Onset   Breast cancer Mother 91   Leukemia Mother 93  CLL   Lymphoma Mother 13   Angina Father   Seizures Brother   Cervical cancer Maternal Aunt   Diabetes Paternal Uncle   High blood pressure (Hypertension) Maternal Grandmother   Diabetes Maternal Grandmother   Depression Maternal Grandmother   Breast cancer Maternal Grandmother   Diabetes Paternal Grandmother   Heart disease Paternal Grandfather   Colon cancer Paternal Grandfather  19's    Social History   Tobacco Use  Smoking Status Former Smoker   Packs/day: 0.25   Years: 0.50   Pack years: 0.12   Types: Cigarettes   Quit date: 10/11/2002   Years since quitting: 18.4  Smokeless Tobacco Never Used    Social History   Socioeconomic History   Marital status: Married  Tobacco Use   Smoking status: Former Smoker  Packs/day: 0.25  Years: 0.50  Pack years: 0.12  Types: Cigarettes  Quit date: 10/11/2002  Years since quitting: 18.4   Smokeless tobacco: Never Used  Substance and Sexual Activity   Alcohol use: Yes  Comment: rarely   Drug use: Never   Objective:   Vitals:  Body mass index is 31.04 kg/m.  Physical Exam Constitutional:  Appearance: Normal appearance.  Chest:  Breasts:  Right: Mass present. No inverted nipple.  Left: No inverted nipple or mass.  Comments: Right subareolar 2.5-3 cm breast mass, adherent to nac Lymphadenopathy:  Upper Body:  Right upper body: No  supraclavicular or axillary adenopathy.  Left upper body: No supraclavicular or axillary adenopathy.  Neurological:  Mental Status: She is alert.   Assessment and Plan:  : Breast cancer metastasized to axillary lymph node, right (CMS-HCC)  Right mastectomy, right TAD, left risk reducing mastectomy Plastics for closure  We discussed the staging and pathophysiology of breast cancer. We discussed all of the different options for treatment for breast cancer including surgery, chemotherapy, radiation therapy, Herceptin, and antiestrogen therapy.  With this particular tumor in a young woman I do not think that neoadjuvant endocrine therapy is a good plan. I do not think this is going to particularly change with neoadjuvant chemotherapy either. I would favor proceeding with surgery as the first step to both remove the tumor as well as give [place] all the information to figure out what the neck step is going to be.  We discussed the options for treatment of the breast cancer which included lumpectomy versus a mastectomy. We discussed the performance of the lumpectomy with radioactive seed placement. We discussed a 5-10% chance of a positive margin requiring reexcision in the operating room. We also discussed that she will likely need radiation therapy if she undergoes lumpectomy. We discussed mastectomy and the postoperative care for that as well. Mastectomy can be followed by reconstruction. The decision for lumpectomy vs mastectomy has no impact on decision for chemotherapy. Most mastectomy patients will not need radiation therapy. We discussed that there is no difference in her survival whether she undergoes lumpectomy with radiation therapy or antiestrogen therapy versus a mastectomy. There is also no real difference between her recurrence in the breast.  Also discussed with her a targeted axillary dissection which fits with the NCCN guidelines for nodes. I discussed a radioactive seed guided excision of  the positive node as well as a sentinel lymph node biopsy. She is agreeable to this. We discussed there is a chance of having additional positive nodes on the sentinel node biopsy we will await the permanent pathology to make any other decisions in terms of her treatment. There is a small chance she could after return for an axillary lymph node dissection. We discussed a 5 to 10% chance at minimum of having chronic shoulder pain, lymphedema, neuropathic pain. This would increase depending on other adjuvant therapy. I will also have her go get measured by physical therapy with L-DEX and then follow her postoperatively to hopefully prevent her from progressing to chronic lymphedema if she were to elevate her developments.

## 2021-06-06 NOTE — Op Note (Signed)
DATE OF OPERATION: 06/06/2021  LOCATION: [facility]  PREOPERATIVE DIAGNOSIS: Breast cancer with acquired absence of bilateral breast  POSTOPERATIVE DIAGNOSIS: Same  PROCEDURE:  Tissue advancement of 300 cm2 bilateral breasts  SURGEON: Jayelle Page Sanger Ameliya Nicotra, DO  ASSISTANT: Krista Blue, PA  EBL: per general surgery  CONDITION: Stable  COMPLICATIONS: None  INDICATION: The patient, Shannon Hale, is a 40 y.o. female born on [birth year], is here for treatment of breast cancer.   PROCEDURE DETAILS:  The patient was seen prior to surgery and marked.  The IV antibiotics were given.  The patient was marked by me prior to the surgery with a standard breast reduction technique marking to include the nipple areola. The patient was taken to the operating room and given a general anesthetic. A standard time out was performed and all information was confirmed by those in the room. SCDs were placed.   The patient was taken to the OR by general surgery who performed bilateral mastectomies.  Once the mastectomies were done the patient was rendered to the plastic surgery service.    Right: The pocket was examined for hemostasis and the electrocautery used as needed.  The V shaped incision was brought together with a 3-0 PDS.  The lateral and medial flaps were then brought to the inframammary fold.  A combination of 3-0 PDS and Monocryl was used to close the deep layers.  A drain was placed in the lateral axillary line.  The drain was secured to the skin with a 3-0 silk.  The remaining skin edges were closed with 4-0 Monocryl.  This allowed for tissue advancement of the 300 cm area to be advanced to the mid axillary line and provide a harmonious contour for closure.    Left:  The pocket was examined for hemostasis and the electrocautery used as needed.  The V shaped incision was brought together with a 3-0 PDS.  The lateral and medial flaps were then brought to the inframammary fold.   A combination of 3-0 PDS and Monocryl was used to close the deep layers.  A drain was placed in the lateral axillary line.  The drain was secured to the skin with a 3-0 silk.  The remaining skin edges were closed with 4-0 Monocryl.  This allowed for tissue advancement of the 300 cm area to be advanced to the mid axillary line and provide a harmonious contour for closure.  Dermabond and Steri-Strips were applied.  The patient was allowed to wake up and taken to recovery room in stable condition at the end of the case. The family was notified at the end of the case.   The advanced practice practitioner (APP) assisted throughout the case.  The APP was essential in retraction and counter traction when needed to make the case progress smoothly.  This retraction and assistance made it possible to see the tissue plans for the procedure.  The assistance was needed for blood control, tissue re-approximation and assisted with closure of the incision site.

## 2021-06-07 ENCOUNTER — Encounter (HOSPITAL_BASED_OUTPATIENT_CLINIC_OR_DEPARTMENT_OTHER): Payer: Self-pay | Admitting: General Surgery

## 2021-06-07 ENCOUNTER — Other Ambulatory Visit: Payer: Self-pay

## 2021-06-07 DIAGNOSIS — Z87891 Personal history of nicotine dependence: Secondary | ICD-10-CM | POA: Diagnosis not present

## 2021-06-07 DIAGNOSIS — C50912 Malignant neoplasm of unspecified site of left female breast: Secondary | ICD-10-CM | POA: Diagnosis not present

## 2021-06-07 DIAGNOSIS — C50911 Malignant neoplasm of unspecified site of right female breast: Secondary | ICD-10-CM | POA: Diagnosis not present

## 2021-06-07 DIAGNOSIS — C773 Secondary and unspecified malignant neoplasm of axilla and upper limb lymph nodes: Secondary | ICD-10-CM | POA: Diagnosis not present

## 2021-06-07 MED ORDER — PHENYLEPHRINE 40 MCG/ML (10ML) SYRINGE FOR IV PUSH (FOR BLOOD PRESSURE SUPPORT)
PREFILLED_SYRINGE | INTRAVENOUS | Status: AC
  Filled 2021-06-07: qty 10

## 2021-06-07 MED ORDER — DEXMEDETOMIDINE (PRECEDEX) IN NS 20 MCG/5ML (4 MCG/ML) IV SYRINGE
PREFILLED_SYRINGE | INTRAVENOUS | Status: AC
  Filled 2021-06-07: qty 10

## 2021-06-07 MED ORDER — METHOCARBAMOL 500 MG PO TABS
500.0000 mg | ORAL_TABLET | Freq: Two times a day (BID) | ORAL | 0 refills | Status: DC | PRN
  Filled 2021-06-07: qty 14, 7d supply, fill #0

## 2021-06-07 MED ORDER — PROPOFOL 500 MG/50ML IV EMUL
INTRAVENOUS | Status: AC
  Filled 2021-06-07: qty 50

## 2021-06-07 MED ORDER — DEXAMETHASONE SODIUM PHOSPHATE 10 MG/ML IJ SOLN
INTRAMUSCULAR | Status: AC
  Filled 2021-06-07: qty 3

## 2021-06-07 MED ORDER — ONDANSETRON HCL 4 MG/2ML IJ SOLN
INTRAMUSCULAR | Status: AC
  Filled 2021-06-07: qty 20

## 2021-06-07 MED ORDER — LIDOCAINE 2% (20 MG/ML) 5 ML SYRINGE
INTRAMUSCULAR | Status: AC
  Filled 2021-06-07: qty 30

## 2021-06-07 NOTE — Discharge Summary (Signed)
Physician Discharge Summary  Patient ID: Shannon Hale MRN: [id number] DOB/AGE: 1980/09/13 40 y.o.  Admit date: 06/06/2021 Discharge date: 06/07/2021  Admission Diagnoses: Breast cancer, right  Discharge Diagnoses:  Active Problems:   S/P bilateral mastectomy   Discharged Condition: good  Hospital Course: 37 yof with clinical stage II right breast cancer underwent right mastectomy, right TAD, left mastectomy.  Following morning she is doing well with expected drain outputs.  Pain well controlled  Consults: None  Significant Diagnostic Studies: none  Treatments: surgery: see above  Discharge Exam: Blood pressure (!) 98/48, pulse 84, temperature 98.1 F (36.7 C), resp. rate 16, height 5\' 9"  (1.753 m), weight 89.4 kg, last menstrual period 05/22/2021, SpO2 99 %, unknown if currently breastfeeding. Flaps viable, no hematoma, drains serosang as expected  Disposition: Discharge disposition: 01-Home or Self Care       Discharge Instructions     Discharge patient   Complete by: As directed    Discharge disposition: 01-Home or Self Care   Discharge patient date: 06/07/2021      Allergies as of 06/07/2021   No Known Allergies      Medication List     TAKE these medications    AMBULATORY NON FORMULARY MEDICATION Eg Cg Boost supplement   APPLE CIDER VINEGAR PO Take 450 mg by mouth.   ascorbic acid 1000 MG tablet Commonly known as: VITAMIN C Take by mouth.   l-methylfolate-B6-B12 3-35-2 MG Tabs tablet Commonly known as: METANX Take 1 tablet by mouth daily.   Magnesium 250 MG Tabs Take by mouth.   methocarbamol 500 MG tablet Commonly known as: Robaxin Take 1 tablet (500 mg total) by mouth 2 (two) times daily as needed for muscle spasms.   N-Acetyl-L-Cysteine 600 MG Caps Take 600 mg by mouth.   NON FORMULARY   Nurtec 75 MG Tbdp Generic drug: Rimegepant Sulfate Use one tablet daily as needed for migraine   ondansetron 4 MG disintegrating  tablet Commonly known as: Zofran ODT Dissolve 1 tablet (4 mg total) by mouth every 8 (eight) hours as needed for nausea or vomiting. (Take 1 tablet (4 mg total) by mouth every 8 (eight) hours as needed for nausea or vomiting.)   ondansetron 4 MG tablet Commonly known as: Zofran Take 1 tablet (4 mg total) by mouth every 8 (eight) hours as needed for nausea or vomiting.   OVER THE COUNTER MEDICATION Take 15 mg by mouth. Bio-Quercetin Phytosome by [employer]   OVER THE COUNTER MEDICATION Take 200 mcg by mouth. Super Selenium Complex 200 mcg with Vitamin E 20 mg   tamoxifen 20 MG tablet Commonly known as: NOLVADEX Take 0.5 tablets (10 mg total) by mouth daily.   Turmeric 450 MG Caps Take by mouth.   Vitamin D-3 125 MCG (5000 UT) Tabs Take 125 mcg by mouth.   vitamin E 180 MG (400 UNITS) capsule Take 400 Units by mouth daily.   zinc gluconate 50 MG tablet Take by mouth.        Follow-up Information     Dillingham, Loel Lofty, DO Follow up in 10 day(s).   Specialty: Plastic Surgery Contact information: [address] [place] [address] [phone]         Rolm Bookbinder, MD Follow up in 2 week(s).   Specialty: General Surgery Contact information: [place] [address] [phone]                 Signed:  Rolm Bookbinder 06/07/2021, 9:28 AM

## 2021-06-12 ENCOUNTER — Encounter: Payer: Self-pay | Admitting: *Deleted

## 2021-06-13 ENCOUNTER — Ambulatory Visit (INDEPENDENT_AMBULATORY_CARE_PROVIDER_SITE_OTHER): Payer: 59 | Admitting: Surgical

## 2021-06-13 ENCOUNTER — Encounter: Payer: Self-pay | Admitting: Surgical

## 2021-06-13 ENCOUNTER — Encounter: Payer: Self-pay | Admitting: *Deleted

## 2021-06-13 ENCOUNTER — Other Ambulatory Visit: Payer: Self-pay

## 2021-06-13 DIAGNOSIS — C50919 Malignant neoplasm of unspecified site of unspecified female breast: Secondary | ICD-10-CM

## 2021-06-13 DIAGNOSIS — Z170 Estrogen receptor positive status [ER+]: Secondary | ICD-10-CM

## 2021-06-13 DIAGNOSIS — C50811 Malignant neoplasm of overlapping sites of right female breast: Secondary | ICD-10-CM

## 2021-06-13 NOTE — Progress Notes (Signed)
40 year old female here for follow-up after bilateral mastectomy followed by immediate bilateral breast reconstruction with tissue rearrangement with Dr. Marla Roe on 06/06/2021.  She is 1 week postop.  Patient had bilateral drains placed.  She is overall doing well.  She is here with her husband.  Drain output has been approximately 15 cc per 24 hours.  She is not having any infectious symptoms.  Pain is well controlled at this time.  Chaperone present on exam On exam honeycomb dressings are in place, there is some mild swelling noted but no subcutaneous fluid collections noted with palpation.  Bilateral JP drains with serosanguineous drainage noted.  No erythema or cellulitic changes noted.  No significant bruising is noted.  Bilateral skin flaps are viable.  We discussed ongoing restrictions of no heavy lifting or strenuous activities.  Continue to wear compressive garments 24/7.  She can switch to a sports bra if she would like.  Bilateral JP drains were removed.  Recommend Vaseline and gauze daily.  We discussed removing honeycomb dressings if they begin to peel and become saturated while bathing.  Recommend calling with questions or concerns.  Recommend following up in 2 weeks for reevaluation.  Call with questions or concerns.  No sign of infection on exam.

## 2021-06-13 NOTE — Progress Notes (Signed)
Patient Care Team: Copland, Gay Filler, MD as PCP - General (Family Medicine) Theodore Demark, RN as Oncology Nurse Navigator Carlynn Spry, Charlott Holler, RN as Oncology Nurse Navigator Mauro Kaufmann, RN as Oncology Nurse Navigator  DIAGNOSIS:    ICD-10-CM   1. Carcinoma of overlapping sites of right breast in female, estrogen receptor positive ([place])  C50.811    Z17.0       SUMMARY OF ONCOLOGIC HISTORY: Oncology History Overview Note  # July 2022-mammary carcinoma right breast retroareolar 2.2 cm [palpable mass]; suspicious on mammogram ultrasound/biopsy-  NEGATIVE for Her2 (1+); Estrogen Receptor: 95%, POSITIVE, Progesterone Receptor: 95%, POSITIVE, Proliferation Marker Ki67: 20%, FOCALLY 50%   Carcinoma of overlapping sites of right breast in female, estrogen receptor positive ([place])  03/14/2021 Initial Diagnosis   Patient palpated a lump in the retroareolar right breast with nipple retraction.  Mammogram revealed right breast mass 2.6 cm and a single right axillary lymph node measuring 1.2 cm.  (PET/CT negative) breast MRI revealed 4.2 cm right retroareolar mass with non-mass enhancement total measuring 14 cm, intraparenchymal lymph node 1.1 cm.  2 abnormal right axillary lymph nodes, biopsy revealed grade 2 IDC with DCIS ER 95%, PR 95%, Ki-67 20% focally 50%, HER2 negative, lymph node positive   04/10/2021 Cancer Staging   Staging form: Breast, AJCC 8th Edition - Clinical: Stage IIA (cT2, cN1, cM0, G2, ER+, PR+, HER2-) - Signed by Nicholas Lose, MD on 04/10/2021 Stage prefix: Initial diagnosis Histologic grading system: 3 grade system   04/24/2021 -  Anti-estrogen oral therapy   Tamoxifen 10 mg daily started neoadjuvantly because of slight delay in surgery     CHIEF COMPLIANT: Follow-up of right breast cancer  INTERVAL HISTORY: Shannon Hale is a 40 y.o. with above-mentioned history of right breast cancer. Bilateral mastectomies on 06/06/2021 showed left breast grade 2 invasive ductal  carcinoma and margins uninvolved by carcinoma, right breast grade 3 invasive ductal carcinoma, high grade DCIS, and margins uninvolved by carcinoma, and two right axillary nodes positive for metastatic carcinoma. She presents to the clinic today for follow-up.   ALLERGIES:  has No Known Allergies.  MEDICATIONS:  Current Outpatient Medications  Medication Sig Dispense Refill   Acetylcysteine (N-ACETYL-L-CYSTEINE) 600 MG CAPS Take 600 mg by mouth.     AMBULATORY NON FORMULARY MEDICATION Eg Cg Boost supplement     APPLE CIDER VINEGAR PO Take 450 mg by mouth.     ascorbic acid (VITAMIN C) 1000 MG tablet Take by mouth.     Cholecalciferol (VITAMIN D-3) 125 MCG (5000 UT) TABS Take 125 mcg by mouth.     l-methylfolate-B6-B12 (METANX) 3-35-2 MG TABS tablet Take 1 tablet by mouth daily.     Magnesium 250 MG TABS Take by mouth.     methocarbamol (ROBAXIN) 500 MG tablet Take 1 tablet (500 mg total) by mouth 2 (two) times daily as needed for muscle spasms. 14 tablet 0   NON FORMULARY      ondansetron (ZOFRAN ODT) 4 MG disintegrating tablet Take 1 tablet (4 mg total) by mouth every 8 (eight) hours as needed for nausea or vomiting. 20 tablet 0   ondansetron (ZOFRAN) 4 MG tablet Take 1 tablet (4 mg total) by mouth every 8 (eight) hours as needed for nausea or vomiting. 30 tablet 2   OVER THE COUNTER MEDICATION Take 15 mg by mouth. Bio-Quercetin Phytosome by All Life Extension     OVER THE COUNTER MEDICATION Take 200 mcg by mouth. Super Selenium Complex 200 mcg  with Vitamin E 20 mg     Rimegepant Sulfate (NURTEC) 75 MG TBDP Use one tablet daily as needed for migraine 5 tablet 1   tamoxifen (NOLVADEX) 20 MG tablet Take 0.5 tablets (10 mg total) by mouth daily. 30 tablet 0   Turmeric 450 MG CAPS Take by mouth.     vitamin E 180 MG (400 UNITS) capsule Take 400 Units by mouth daily.     zinc gluconate 50 MG tablet Take by mouth.     No current facility-administered medications for this visit.    PHYSICAL  EXAMINATION: ECOG PERFORMANCE STATUS: 1 - Symptomatic but completely ambulatory  Vitals:   06/14/21 1505  BP: 108/60  Pulse: 85  Resp: 18  Temp: 98.1 F (36.7 C)  SpO2: 98%   Filed Weights   06/14/21 1505  Weight: 195 lb 4.8 oz (88.6 kg)      LABORATORY DATA:  I have reviewed the data as listed CMP Latest Ref Rng & Units 03/20/2021 12/03/2017 11/25/2013  Glucose 70 - 99 mg/dL 106(H) 92 91  BUN 6 - 20 mg/dL 8 5(L) 8  Creatinine 0.44 - 1.00 mg/dL 0.75 0.71 0.75  Sodium 135 - 145 mmol/L 141 137 140  Potassium 3.5 - 5.1 mmol/L 4.0 4.2 4.2  Chloride 98 - 111 mmol/L 107 104 103  CO2 22 - 32 mmol/L '26 26 26  ' Calcium 8.9 - 10.3 mg/dL 10.0 9.1 10.1  Total Protein 6.5 - 8.1 g/dL 7.6 7.6 8.2  Total Bilirubin 0.3 - 1.2 mg/dL 0.5 0.4 0.3  Alkaline Phos 38 - 126 U/L 61 76 79  AST 15 - 41 U/L '17 26 21  ' ALT 0 - 44 U/L '15 22 16    ' Lab Results  Component Value Date   WBC 5.9 03/20/2021   HGB 12.9 03/20/2021   HCT 37.6 03/20/2021   MCV 83.6 03/20/2021   PLT 283 03/20/2021   NEUTROABS 3.4 03/20/2021    ASSESSMENT & PLAN:  Carcinoma of overlapping sites of right breast in female, estrogen receptor positive ([place]) 03/14/2021: Patient palpated a lump in the retroareolar right breast with nipple retraction.  Mammogram revealed right breast mass 2.6 cm and a single right axillary lymph node measuring 1.2 cm.  (PET/CT negative) breast MRI revealed 4.2 cm right retroareolar mass with non-mass enhancement total measuring 14 cm, intraparenchymal lymph node 1.1 cm.  2 abnormal right axillary lymph nodes, biopsy revealed grade 2 IDC with DCIS ER 95%, PR 95%, Ki-67 20% focally 50%, HER2 negative, lymph node positive   PET CT scan: Negative for distant metastatic disease MammaPrint: High risk   Treatment plan: 1.  Bilateral mastectomies 06/06/2021: [facility]: Left mastectomy: Grade 2 IDC 0.3 cm, margins negative Right mastectomy: Grade 3 IDC with DCIS 7.5 cm, margins negative, 4/4 lymph nodes  positive ER 95%, PR 95%, HER2 1+, Ki-67 20% focally 50% 2.  adjuvant chemotherapy with dose dense Adriamycin and Cytoxan x4 followed by Taxol x12 3. Adjuvant antiestrogen therapy with AI plus ovarian suppression plus or minus abemaciclib versus tamoxifen with or without abemaciclib ------------------------------------------------------------------------------------------------------------------------------------ Pathology counseling: I discussed the final pathology report of the patient provided  a copy of this report. I discussed the margins as well as lymph node surgeries. We also discussed the final staging along with previously performed ER/PR and HER-2/neu testing. On the mastectomy, she has contralateral breast cancer as well.  Because the tumor was very small, I felt that she would not need sentinel lymph node study.  I discussed with  the patient about the role of axillary lymph node dissection and felt that she could be spared of the axillary lymph node dissection if she were to agree to radiation and systemic chemotherapy. However it appears that she is not keen on doing any of the above.  She definitely does not want to do axillary lymph node dissection.  She is also not interested in radiation (because of concern for skin toxicities) or chemotherapy either.  I discussed with her that the risk of distant recurrence is what worries is the most by skipping chemotherapy. She understands these risks and will make of her decision.  If she decides on not doing any of the above treatments, then we will treat her with ovarian function suppression with aromatase inhibitor along with abemaciclib.   No orders of the defined types were placed in this encounter.  The patient has a good understanding of the overall plan. she agrees with it. she will call with any problems that may develop before the next visit here.  Total time spent: 30 mins including face to face time and time spent for planning,  charting and coordination of care  Rulon Eisenmenger, MD, MPH 06/14/2021  I, Thana Ates, am acting as scribe for Dr. Nicholas Lose.  I have reviewed the above documentation for accuracy and completeness, and I agree with the above.

## 2021-06-14 ENCOUNTER — Encounter: Payer: Self-pay | Admitting: Family Medicine

## 2021-06-14 ENCOUNTER — Inpatient Hospital Stay: Payer: 59 | Attending: Hematology and Oncology | Admitting: Hematology and Oncology

## 2021-06-14 DIAGNOSIS — Z79810 Long term (current) use of selective estrogen receptor modulators (SERMs): Secondary | ICD-10-CM | POA: Insufficient documentation

## 2021-06-14 DIAGNOSIS — C50811 Malignant neoplasm of overlapping sites of right female breast: Secondary | ICD-10-CM | POA: Diagnosis not present

## 2021-06-14 DIAGNOSIS — C773 Secondary and unspecified malignant neoplasm of axilla and upper limb lymph nodes: Secondary | ICD-10-CM | POA: Insufficient documentation

## 2021-06-14 DIAGNOSIS — Z9013 Acquired absence of bilateral breasts and nipples: Secondary | ICD-10-CM | POA: Diagnosis not present

## 2021-06-14 DIAGNOSIS — Z170 Estrogen receptor positive status [ER+]: Secondary | ICD-10-CM | POA: Insufficient documentation

## 2021-06-14 NOTE — Assessment & Plan Note (Addendum)
03/14/2021:Patient palpated a lump in the retroareolar right breast with nipple retraction. Mammogram revealed right breast mass 2.6 cm and a single right axillary lymph node measuring 1.2 cm. (PET/CT negative) breast MRI revealed 4.2 cm right retroareolar mass with non-mass enhancement total measuring 14 cm, intraparenchymal lymph node 1.1 cm. 2 abnormal right axillary lymph nodes, biopsy revealed grade 2 IDC with DCIS ER 95%, PR 95%, Ki-67 20% focally 50%, HER2 negative, lymph node positive  PET CT scan: Negative for distant metastatic disease MammaPrint: High risk  Treatment plan: 1.Bilateral mastectomies 06/06/2021: [facility]: Left mastectomy: Grade 2 IDC 0.3 cm, margins negative Right mastectomy: Grade 3 IDC with DCIS 7.5 cm, margins negative, 4/4 lymph nodes positive ER 95%, PR 95%, HER2 1+, Ki-67 20% focally 50% 2.adjuvant chemotherapy with dose dense Adriamycin and Cytoxan x4 followed by Taxol x12 3.Adjuvant antiestrogen therapy with AI plus ovarian suppression plus or minus abemaciclib versus tamoxifen with or without abemaciclib ------------------------------------------------------------------------------------------------------------------------------------ Pathology counseling: I discussed the final pathology report of the patient provided  a copy of this report. I discussed the margins as well as lymph node surgeries. We also discussed the final staging along with previously performed ER/PR and HER-2/neu testing.  Treatment plan start chemotherapy in 3 weeks URCC nausea study  Return to clinic to start chemo.

## 2021-06-15 ENCOUNTER — Encounter: Payer: 59 | Admitting: Plastic Surgery

## 2021-06-15 ENCOUNTER — Telehealth: Payer: Self-pay | Admitting: Plastic Surgery

## 2021-06-15 NOTE — Telephone Encounter (Signed)
Returned patients call. She was seen by Dr. Donne Hazel today and he was concerned with some swelling of the right breast and felt she should be seen sometime next week to be assessed. Patient denies any fever, chills, nausea, vomiting, nor hardness. Will send message to front desk to call patient back to schedule for next week.

## 2021-06-15 NOTE — Telephone Encounter (Signed)
Per patient- she was seen by her pcp who expressed concern with swelling in the right breast. Slight pink discoloration, no additional tenderness or fever. Sx 10/5. Please call to advise [phone]. Thank you.

## 2021-06-18 ENCOUNTER — Encounter: Payer: Self-pay | Admitting: *Deleted

## 2021-06-19 LAB — SURGICAL PATHOLOGY

## 2021-06-22 ENCOUNTER — Encounter: Payer: Self-pay | Admitting: Plastic Surgery

## 2021-06-22 ENCOUNTER — Other Ambulatory Visit: Payer: Self-pay

## 2021-06-22 ENCOUNTER — Ambulatory Visit (INDEPENDENT_AMBULATORY_CARE_PROVIDER_SITE_OTHER): Payer: 59 | Admitting: Plastic Surgery

## 2021-06-22 DIAGNOSIS — Z9013 Acquired absence of bilateral breasts and nipples: Secondary | ICD-10-CM

## 2021-06-22 NOTE — Progress Notes (Signed)
The patient is a 40 year old female here for follow-up after undergoing bilateral mastectomies.  We did an oncoplastic closure.  She is doing really well.  I removed the honeycomb dressings.  She has swelling and bruising as expected.  There is a little seroma on the right.  I would like to see if she may be able to absorb this on her own over the next week or 2.  She is in agreement.  I will have her come back and if it still there we will probably go ahead and drained.  She knows to call me if it does get any worse and I will have her come back again.

## 2021-06-25 ENCOUNTER — Telehealth: Payer: Self-pay | Admitting: *Deleted

## 2021-06-25 ENCOUNTER — Encounter: Payer: Self-pay | Admitting: *Deleted

## 2021-06-25 NOTE — Telephone Encounter (Signed)
Spoke to pt regarding decision for treatment. Pt has decided to forgo chemo and is still contemplating xrt. Informed pt when decision is made to place call. Received verbal understanding. Physician team notified.

## 2021-06-27 ENCOUNTER — Ambulatory Visit: Payer: 59 | Attending: General Surgery | Admitting: Rehabilitation

## 2021-06-27 ENCOUNTER — Other Ambulatory Visit: Payer: Self-pay

## 2021-06-27 ENCOUNTER — Encounter: Payer: Self-pay | Admitting: Rehabilitation

## 2021-06-27 DIAGNOSIS — Z483 Aftercare following surgery for neoplasm: Secondary | ICD-10-CM | POA: Insufficient documentation

## 2021-06-27 DIAGNOSIS — Z170 Estrogen receptor positive status [ER+]: Secondary | ICD-10-CM | POA: Insufficient documentation

## 2021-06-27 DIAGNOSIS — C50811 Malignant neoplasm of overlapping sites of right female breast: Secondary | ICD-10-CM | POA: Insufficient documentation

## 2021-06-27 DIAGNOSIS — M25611 Stiffness of right shoulder, not elsewhere classified: Secondary | ICD-10-CM | POA: Insufficient documentation

## 2021-06-27 NOTE — Therapy (Signed)
[facility] @ [place], [place], [address] Phone: [phone]   Fax:  [phone]  Physical Therapy Treatment  Patient Details  Name: Shannon Hale MRN: [id number] Date of Birth: 1981-05-18 Referring Provider (PT): Dr. Donne Hazel   Encounter Date: 06/27/2021   PT End of Session - 06/27/21 1644     Visit Number 2    Number of Visits 4    Date for PT Re-Evaluation 07/25/21    PT Start Time 1600    PT Stop Time 1640    PT Time Calculation (min) 40 min    Activity Tolerance Patient tolerated treatment well    Behavior During Therapy [facility] for tasks assessed/performed             Past Medical History:  Diagnosis Date   Abnormal Pap smear    Headache(784.0)    migraines   Hepatitis 03/2004   unknown form of hepatitis, no current liver problems   History of chicken pox    History of D&C    D&E   Migraine    Nausea    PONV (postoperative nausea and vomiting)    Vaginal Pap smear, abnormal     Past Surgical History:  Procedure Laterality Date   BREAST RECONSTRUCTION Bilateral 06/06/2021   Procedure: BILATERAL BREAST RECONSTRUCTION WITH TISSUE REARRANGEMENT;  Surgeon: Wallace Going, DO;  Location: [facility];  Service: Plastics;  Laterality: Bilateral;   CHOLECYSTECTOMY N/A 12/01/2013   Procedure: LAPAROSCOPIC CHOLECYSTECTOMY ;  Surgeon: Harl Bowie, MD;  Location: [facility];  Service: General;  Laterality: N/A;   DILATION AND EVACUATION N/A 07/14/2014   Procedure: DILATATION AND EVACUATION;  Surgeon: Marylynn Pearson, MD;  Location: [facility];  Service: Gynecology;  Laterality: N/A;   MASTECTOMY W/ SENTINEL NODE BIOPSY Right 06/06/2021   Procedure: RIGHT MASTECTOMY WITH AXILLARY SENTINEL LYMPH NODE BIOPSY;  Surgeon: Rolm Bookbinder, MD;  Location: [facility];  Service: General;  Laterality: Right;   RADIOACTIVE SEED GUIDED AXILLARY SENTINEL LYMPH NODE Right  06/06/2021   Procedure: RADIOACTIVE SEED GUIDED RIGHT AXILLARY SENTINEL LYMPH NODE EXCISION;  Surgeon: Rolm Bookbinder, MD;  Location: [facility];  Service: General;  Laterality: Right;   SEPTOPLASTY     TONSILLECTOMY     TOTAL MASTECTOMY Left 06/06/2021   Procedure: LEFT TOTAL MASTECTOMY;  Surgeon: Rolm Bookbinder, MD;  Location: [facility];  Service: General;  Laterality: Left;   WISDOM TOOTH EXTRACTION      There were no vitals filed for this visit.   Subjective Assessment - 06/27/21 1558     Subjective Surgery was much easier than I thought. I"m mainly worried about going back to work    Pertinent History right breast cancer ER/PR+, Her2-, Ki 67 20% . Bilateral mastectomy with no reconstruction 06/06/21. No chemotherapy. Still thinking about radiation.  4/4 nodes positive on the Rt.  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  She was told in the past that she has "scapular rotation" on the right    Limitations House hold activities    Currently in Pain? No/denies                [facility] PT Assessment - 06/27/21 0001       Assessment   Medical Diagnosis right breast cancer    Referring Provider (PT) Dr. Donne Hazel    Onset Date/Surgical Date 06/06/21    Hand Dominance Right;Left  Precautions   Precaution Comments lymphedema Rt UE      Prior Function   Level of Independence Independent    Vocation Part time employment    Vocation Requirements pushing stretchers, lifting patients      Observation/Other Assessments   Observations bil healing mastectomy  - has compression bra - with gauze for padding the chest - good amount of extra skin bilaterally but this was expected due to chest size      AROM   Right Shoulder Extension 60 Degrees    Right Shoulder Flexion 127 Degrees   pulling in the medial arm   Right Shoulder ABduction 115 Degrees   pulling in the chest   Right Shoulder External Rotation 90 Degrees     Left Shoulder Flexion 165 Degrees    Left Shoulder ABduction 170 Degrees               LYMPHEDEMA/ONCOLOGY QUESTIONNAIRE - 06/27/21 0001       Surgeries   Mastectomy Date 06/06/21    Number Lymph Nodes Removed 4      Treatment   Active Chemotherapy Treatment No    Past Chemotherapy Treatment No    Active Radiation Treatment No    Past Radiation Treatment No    Current Hormone Treatment No    Past Hormone Therapy No      Right Upper Extremity Lymphedema   10 cm Proximal to Olecranon Process 33.7 cm    Olecranon Process 27.5 cm    15 cm Proximal to Ulnar Styloid Process 25.8 cm    Just Proximal to Ulnar Styloid Process 16.5 cm    Across Hand at [employer] 19.4 cm    At [place] of 2nd Digit 6.5 cm                Quick Dash - 06/27/21 0001     Open a tight or new jar No difficulty    Do heavy household chores (wash walls, wash floors) Moderate difficulty    Carry a shopping bag or briefcase No difficulty    Wash your back Mild difficulty    Use a knife to cut food No difficulty    Recreational activities in which you take some force or impact through your arm, shoulder, or hand (golf, hammering, tennis) Unable    During the past week, to what extent has your arm, shoulder or hand problem interfered with your normal social activities with family, friends, neighbors, or groups? Not at all    During the past week, to what extent has your arm, shoulder or hand problem limited your work or other regular daily activities Slightly    Arm, shoulder, or hand pain. Mild    Tingling (pins and needles) in your arm, shoulder, or hand None    Difficulty Sleeping No difficulty    DASH Score 20.45 %                                  PT Long Term Goals - 06/27/21 1648       PT LONG TERM GOAL #1   Title Pt will return to baseline AROM on bil shoulders    Time 4    Period Weeks    Status Partially Met      PT LONG TERM GOAL #2   Title Pt will be  ind with HEP for return to weight lifting at home  Time 4    Period Weeks    Status New      PT LONG TERM GOAL #3   Title Pt will obtain compression sleeve for work    Time 4    Period Weeks    Status New                   Plan - 06/27/21 1644     Clinical Impression Statement Pt returns 3 weeks post bil mastectomy doing very well.  The Lt UE has returned to baseline AROM but the Rt is lacking motion due to SLNB.  Pt was instructed on continuing post op exercises for now changing overhead and behind the head to supine and adding flexion wall walking as well.  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  Pt will return in around 2 weeks to recheck AROM and teach either supine scap or strength ABC.    PT Frequency Biweekly    PT Duration 4 weeks    PT Treatment/Interventions ADLs/Self Care Home Management;Therapeutic exercise;Therapeutic activities;Patient/family education;Manual techniques    PT Next Visit Plan reassess, upgrade home exercise program to supine scap vs strength ABC or similar and any new stretches, schedule SOZO    Consulted and Agree with Plan of Care Patient             Patient will benefit from skilled therapeutic intervention in order to improve the following deficits and impairments:     Visit Diagnosis: Carcinoma of overlapping sites of right breast in female, estrogen receptor positive [facility])  Aftercare following surgery for neoplasm  Stiffness of right shoulder, not elsewhere classified     Problem List Patient Active Problem List   Diagnosis Date Noted   S/P bilateral mastectomy 06/06/2021   Breast cancer ([place]) 03/27/2021   Carcinoma of overlapping sites of right breast in female, estrogen receptor positive ([place]) 03/22/2021   Headache 06/26/2020   Rash 06/26/2020   New daily persistent headache  05/21/2018   Disorder of ligament of left hand 07/20/2015   Obesity 09/25/2014   Dermatitis 12/31/2013   Cholelithiasis 02/29/2012   BACK PAIN, THORACIC REGION 11/19/2010   APHTHOUS STOMATITIS 08/10/2009   MIGRAINE HEADACHE 09/23/2008    Stark Bray, PT 06/27/2021, 4:50 PM  [facility] @ [place], [place], [address] Phone: [phone]   Fax:  [phone]  Name: SHERLENE RICKEL MRN: [id number] Date of Birth: [birth year]

## 2021-06-27 NOTE — Patient Instructions (Signed)
     [facility]  [address], [address]  [place] [address]  [phone]  After Breast Cancer Class It is recommended you attend the ABC class to be educated on lymphedema risk reduction. This class is free of charge and lasts for 1 hour. It is a 1-time class.   Scar massage - when the steri-strips are removed - coconut oil is recommended    Compression garment - you have the script for this    Home exercise Program   Follow up PT: It is recommended you return every 3 months for the first 3 years following surgery to be assessed on the SOZO machine for an L-Dex score. This helps prevent clinically significant lymphedema in 95% of patients. These follow up screens are 10 minute appointments that you are not billed for.

## 2021-06-28 DIAGNOSIS — Z853 Personal history of malignant neoplasm of breast: Secondary | ICD-10-CM | POA: Diagnosis not present

## 2021-06-29 ENCOUNTER — Encounter: Payer: 59 | Admitting: Plastic Surgery

## 2021-07-02 NOTE — Progress Notes (Signed)
Patient is a 40 year old female s/p bilateral mastectomy with immediate aesthetic closure performed 06/06/2021 by Dr. Marla Roe who returns to clinic for scheduled postoperative appointment.  She was seen most recently on 06/22/2021 at which time she was doing quite well.  Exam was reassuring, small seroma on the right side that did not require aspiration.  Today, patient is doing well.  She had a few questions that were answered.  She states that she still has mild swelling/possible seroma involving right breast.  Nothing concerning on exam.  No significant seroma appreciated.  She denies any redness, worsening swelling, streaking, fevers or chills, or any significant discomfort.  Discussed aspiration versus continued compression.  We will continue with conservative management via compression bra from [place].    She does report that she will likely need radiation soon.  Steri-Strips remain firmly intact at inframammary incisions.  Will not remove them.  Would like to see her once more in 2 weeks to ensure continued improvement as well as obtain postoperative photos before she is cleared of our services.  Continued activity modifications in interim.

## 2021-07-03 ENCOUNTER — Other Ambulatory Visit: Payer: Self-pay

## 2021-07-03 ENCOUNTER — Ambulatory Visit (INDEPENDENT_AMBULATORY_CARE_PROVIDER_SITE_OTHER): Payer: 59 | Admitting: Physician Assistant

## 2021-07-03 DIAGNOSIS — Z9013 Acquired absence of bilateral breasts and nipples: Secondary | ICD-10-CM

## 2021-07-12 ENCOUNTER — Telehealth: Payer: 59 | Admitting: Physician Assistant

## 2021-07-12 DIAGNOSIS — R3989 Other symptoms and signs involving the genitourinary system: Secondary | ICD-10-CM | POA: Diagnosis not present

## 2021-07-13 ENCOUNTER — Other Ambulatory Visit: Payer: Self-pay

## 2021-07-13 ENCOUNTER — Encounter: Payer: Self-pay | Admitting: *Deleted

## 2021-07-13 MED ORDER — SULFAMETHOXAZOLE-TRIMETHOPRIM 800-160 MG PO TABS
1.0000 | ORAL_TABLET | Freq: Two times a day (BID) | ORAL | 0 refills | Status: DC
  Filled 2021-07-13: qty 10, 5d supply, fill #0

## 2021-07-13 NOTE — Progress Notes (Signed)

## 2021-07-16 ENCOUNTER — Encounter: Payer: Self-pay | Admitting: *Deleted

## 2021-07-16 DIAGNOSIS — Z9012 Acquired absence of left breast and nipple: Secondary | ICD-10-CM | POA: Diagnosis not present

## 2021-07-16 DIAGNOSIS — C50911 Malignant neoplasm of unspecified site of right female breast: Secondary | ICD-10-CM | POA: Diagnosis not present

## 2021-07-16 DIAGNOSIS — C50811 Malignant neoplasm of overlapping sites of right female breast: Secondary | ICD-10-CM

## 2021-07-17 ENCOUNTER — Ambulatory Visit: Payer: 59 | Admitting: Physician Assistant

## 2021-07-18 ENCOUNTER — Ambulatory Visit: Payer: 59 | Attending: General Surgery | Admitting: Rehabilitation

## 2021-07-18 ENCOUNTER — Encounter: Payer: Self-pay | Admitting: Rehabilitation

## 2021-07-18 ENCOUNTER — Other Ambulatory Visit: Payer: Self-pay

## 2021-07-18 ENCOUNTER — Encounter: Payer: Self-pay | Admitting: *Deleted

## 2021-07-18 DIAGNOSIS — Z483 Aftercare following surgery for neoplasm: Secondary | ICD-10-CM | POA: Insufficient documentation

## 2021-07-18 DIAGNOSIS — R293 Abnormal posture: Secondary | ICD-10-CM | POA: Diagnosis not present

## 2021-07-18 DIAGNOSIS — M25611 Stiffness of right shoulder, not elsewhere classified: Secondary | ICD-10-CM | POA: Insufficient documentation

## 2021-07-18 DIAGNOSIS — Z170 Estrogen receptor positive status [ER+]: Secondary | ICD-10-CM | POA: Insufficient documentation

## 2021-07-18 DIAGNOSIS — C50811 Malignant neoplasm of overlapping sites of right female breast: Secondary | ICD-10-CM | POA: Diagnosis not present

## 2021-07-18 NOTE — Therapy (Signed)
[facility] @ [address] [place], [place], [address] Phone: [phone]   Fax:  [phone]  Physical Therapy Treatment  Patient Details  Name: Shannon Hale MRN: [id number] Date of Birth: August 30, 1981 Referring Provider (PT): Dr. Donne Hazel   Encounter Date: 07/18/2021   PT End of Session - 07/18/21 1602     Visit Number 3    Number of Visits 9    Date for PT Re-Evaluation 08/29/21    PT Start Time 1500    PT Stop Time 1555    PT Time Calculation (min) 55 min    Activity Tolerance Patient tolerated treatment well    Behavior During Therapy [facility] for tasks assessed/performed             Past Medical History:  Diagnosis Date   Abnormal Pap smear    Headache(784.0)    migraines   Hepatitis 03/2004   unknown form of hepatitis, no current liver problems   History of chicken pox    History of D&C    D&E   Migraine    Nausea    PONV (postoperative nausea and vomiting)    Vaginal Pap smear, abnormal     Past Surgical History:  Procedure Laterality Date   BREAST RECONSTRUCTION Bilateral 06/06/2021   Procedure: BILATERAL BREAST RECONSTRUCTION WITH TISSUE REARRANGEMENT;  Surgeon: Wallace Going, DO;  Location: [facility];  Service: Plastics;  Laterality: Bilateral;   CHOLECYSTECTOMY N/A 12/01/2013   Procedure: LAPAROSCOPIC CHOLECYSTECTOMY ;  Surgeon: Harl Bowie, MD;  Location: [facility];  Service: General;  Laterality: N/A;   DILATION AND EVACUATION N/A 07/14/2014   Procedure: DILATATION AND EVACUATION;  Surgeon: Marylynn Pearson, MD;  Location: [facility];  Service: Gynecology;  Laterality: N/A;   MASTECTOMY W/ SENTINEL NODE BIOPSY Right 06/06/2021   Procedure: RIGHT MASTECTOMY WITH AXILLARY SENTINEL LYMPH NODE BIOPSY;  Surgeon: Rolm Bookbinder, MD;  Location: [facility];  Service: General;  Laterality: Right;   RADIOACTIVE SEED GUIDED AXILLARY SENTINEL LYMPH NODE Right  06/06/2021   Procedure: RADIOACTIVE SEED GUIDED RIGHT AXILLARY SENTINEL LYMPH NODE EXCISION;  Surgeon: Rolm Bookbinder, MD;  Location: [facility];  Service: General;  Laterality: Right;   SEPTOPLASTY     TONSILLECTOMY     TOTAL MASTECTOMY Left 06/06/2021   Procedure: LEFT TOTAL MASTECTOMY;  Surgeon: Rolm Bookbinder, MD;  Location: [facility];  Service: General;  Laterality: Left;   WISDOM TOOTH EXTRACTION      There were no vitals filed for this visit.   Subjective Assessment - 07/18/21 1500     Subjective Everything is going well.  The Rt side is still tight.  Back to work on Saturday.    Pertinent History right breast cancer ER/PR+, Her2-, Ki 67 20% . Bilateral mastectomy with no reconstruction 06/06/21. No chemotherapy. Still thinking about radiation.  4/4 nodes positive on the Rt.  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  She was told in the past that she has "scapular rotation" on the right    Currently in Pain? No/denies                [facility] PT Assessment - 07/18/21 0001       AROM   Right Shoulder Flexion 153 Degrees   with some cording like pull   Right Shoulder ABduction 140 Degrees   pull and pain     PROM   Overall  PROM Comments cording evident and palpable during PROM into D2 position - explained cording and increasing visits due to occurrence.                           [facility] Adult PT Treatment/Exercise - 07/18/21 0001       Exercises   Exercises Other Exercises;Shoulder    Other Exercises  supine dowel flexion 5" x 2, single arm doorway stretch x 20", Y position stretch x 20", LTR with goal post arms 2x5" all added to HEP for cording with instruction      Shoulder Exercises: Pulleys   Flexion 1 minute    ABduction 1 minute      Manual Therapy   Manual Therapy Soft tissue mobilization;Passive ROM;Myofascial release    Manual therapy comments noted sloshing seroma Rt chest which  is being monitored and pt will see Dr. Donne Hazel tomorrow    Myofascial Release to the Rt axilla and upper arm working on cords    Passive ROM to the Rt UE with cording blocking and release as tolerated                          PT Long Term Goals - 06/27/21 1648       PT LONG TERM GOAL #1   Title Pt will return to baseline AROM on bil shoulders    Time 4    Period Weeks    Status Partially Met      PT LONG TERM GOAL #2   Title Pt will be ind with HEP for return to weight lifting at home    Time 4    Period Weeks    Status New      PT LONG TERM GOAL #3   Title Pt will obtain compression sleeve for work    Time 4    Period Weeks    Status New                   Plan - 07/18/21 1602     Clinical Impression Statement Pt returned after 3 weeks of working on stretches independently due to schedule with improvements in AROM but still not yet to baseline.  Also noted some cording present in the Rt UE from the axilla into the upper arm.  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  Lt side has a small place at the T junction with yellow sloughy tissue but not open. Pt will see Dr. Donne Hazel tomorrow regarding any aspiration and wound advice.  Started some cording relase today, PROM, and AAROM with the pulleys.  Updated HEP    PT Frequency 1x / week    PT Duration 6 weeks    PT Treatment/Interventions ADLs/Self Care Home Management;Therapeutic exercise;Therapeutic activities;Patient/family education;Manual techniques    PT Next Visit Plan reassess cording AROM and return to work, cording release/PROM as needed,  upgrade home exercise program to supine scap vs strength ABC, schedule SOZO before DC    PT Home Exercise Plan post op exercises; medbridge LTR, single arm doorway, Y stretch    Consulted and Agree with Plan of Care Patient             Patient will benefit from skilled therapeutic  intervention in order to improve the following deficits and impairments:  Visit Diagnosis: Carcinoma of overlapping sites of right breast in female, estrogen receptor positive ([place])  Aftercare following surgery for neoplasm  Stiffness of right shoulder, not elsewhere classified  Abnormal posture     Problem List Patient Active Problem List   Diagnosis Date Noted   S/P bilateral mastectomy 06/06/2021   Breast cancer ([place]) 03/27/2021   Carcinoma of overlapping sites of right breast in female, estrogen receptor positive ([facility]) 03/22/2021   Headache 06/26/2020   Rash 06/26/2020   New daily persistent headache 05/21/2018   Disorder of ligament of left hand 07/20/2015   Obesity 09/25/2014   Dermatitis 12/31/2013   Cholelithiasis 02/29/2012   BACK PAIN, THORACIC REGION 11/19/2010   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

## 2021-07-23 ENCOUNTER — Other Ambulatory Visit: Payer: Self-pay

## 2021-07-23 ENCOUNTER — Ambulatory Visit (INDEPENDENT_AMBULATORY_CARE_PROVIDER_SITE_OTHER): Payer: 59 | Admitting: Physician Assistant

## 2021-07-23 DIAGNOSIS — Z9013 Acquired absence of bilateral breasts and nipples: Secondary | ICD-10-CM

## 2021-07-23 NOTE — Progress Notes (Signed)
Patient is a 40 year old female s/p bilateral mastectomy with immediate aesthetic closure performed 06/06/2021 by Dr. Marla Roe who returns to clinic for scheduled postoperative appointment.  She was last seen here in clinic 07/03/2021.  At that time, exam was reassuring.  She did have very mild swelling involving right side, but no evidence concerning for infection.  Discussed aspiration versus continued compression and elected to proceed with compression only.  Steri-Strips remained firmly intact and were left in place.  Recommending continued activity modifications in interim.  Today, patient is doing well.  She reports that she used an Ace wrap for compression as opposed to her typical compressive bra and her right-sided swelling and puffiness has resolved.  She has mild residual Dermabond, but otherwise feels improved.  She is starting to do strength training with physical therapy now that she is 6 weeks postop.  Patient is already back at work and denying any ongoing pain symptoms.  She does inquire about scar mitigation treatment.  Physical exam reassuring.  Incisions appear to be well-healed.  Single sutures removed.  No areas of subcutaneous fluid collection.  No redness.  Mild residual Dermabond.  Recommending Vaseline over the periincisional area to help dissolve any of the residual Dermabond.  Discussed options for scar treatment with patient, recommending Mederma.  Given that this is a new scar, recommend that they apply Mederma once daily at night x8 weeks.  Picture(s) obtained of the patient and placed in the chart were with the patient's or guardian's permission.  She can certainly call the clinic should she develop any new questions or concerns.  Otherwise, no specific follow-up needed.

## 2021-07-27 ENCOUNTER — Other Ambulatory Visit (HOSPITAL_BASED_OUTPATIENT_CLINIC_OR_DEPARTMENT_OTHER): Payer: Self-pay

## 2021-07-27 MED ORDER — FLUARIX QUADRIVALENT 0.5 ML IM SUSY
PREFILLED_SYRINGE | INTRAMUSCULAR | 0 refills | Status: DC
  Filled 2021-07-27: qty 0.5, 1d supply, fill #0

## 2021-08-01 NOTE — Progress Notes (Incomplete)
Location of Breast Cancer: right breast  Histology per Pathology Report:    Receptor Status:  Estrogen Receptor: POSITIVE, 95% STRONG             Progesterone Receptor: POSITIVE, 95% STRONG             HER2: NEGATIVE (1+, IHC)             Ki-67: 20% (focally 50%)   Did patient present with symptoms (if so, please note symptoms) or was this found on screening mammography?: Patient noted to have a breast lump in May 2022 during the physical exam with gynecology.  Past/Anticipated interventions by surgeon, if any:  Procedure: 1.  Left risk reducing mastectomy 2.  Right mastectomy 3.  Injection of mag trace for sentinel lymph node identification 4.  Right deep axillary sentinel lymph node biopsy 5.  Right radioactive seed guided axillary lymph node excision Surgeon: Dr. Serita Grammes  Past/Anticipated interventions by medical oncology, if any: Dr Lindi Adie Treatment plan: 1.  Bilateral mastectomies 06/06/2021: [facility]: Left mastectomy: Grade 2 IDC 0.3 cm, margins negative Right mastectomy: Grade 3 IDC with DCIS 7.5 cm, margins negative, 4/4 lymph nodes positive ER 95%, PR 95%, HER2 1+, Ki-67 20% focally 50% 2.  adjuvant chemotherapy with dose dense Adriamycin and Cytoxan x4 followed by Taxol x12 3. Adjuvant antiestrogen therapy with AI plus ovarian suppression plus or minus abemaciclib versus tamoxifen with or without abemaciclib  Lymphedema issues, if any:  {:18581} {t:21944}   Pain issues, if any:  {:18581} {PAIN DESCRIPTION:21022940}  SAFETY ISSUES: Prior radiation? {:18581} Pacemaker/ICD? {:18581} Possible current pregnancy?{:18581} Is the patient on methotrexate? {:18581}  Current Complaints / other details:  ***    ***

## 2021-08-06 ENCOUNTER — Telehealth: Payer: Self-pay | Admitting: *Deleted

## 2021-08-06 NOTE — Telephone Encounter (Signed)
Pt called and relate after careful consideration, she has decided to forgo xrt. Pt has agreed to ovarian suppression. Scheduled and confirmed appt with Dr. Lindi Adie on 12/14 to further discuss. Physician team notified.  Denies further needs or questions

## 2021-08-07 ENCOUNTER — Encounter: Payer: Self-pay | Admitting: Physical Therapy

## 2021-08-08 ENCOUNTER — Ambulatory Visit
Admission: RE | Admit: 2021-08-08 | Discharge: 2021-08-08 | Disposition: A | Discharge status: Home / Self Care | Payer: 59 | Source: Ambulatory Visit | Attending: Radiation Oncology | Admitting: Radiation Oncology

## 2021-08-08 ENCOUNTER — Other Ambulatory Visit: Payer: Self-pay

## 2021-08-08 ENCOUNTER — Telehealth: Payer: 59 | Admitting: Nurse Practitioner

## 2021-08-08 ENCOUNTER — Ambulatory Visit: Payer: 59

## 2021-08-08 DIAGNOSIS — R3989 Other symptoms and signs involving the genitourinary system: Secondary | ICD-10-CM | POA: Diagnosis not present

## 2021-08-08 MED ORDER — CEPHALEXIN 500 MG PO CAPS
500.0000 mg | ORAL_CAPSULE | Freq: Two times a day (BID) | ORAL | 0 refills | Status: DC
  Filled 2021-08-08: qty 14, 7d supply, fill #0

## 2021-08-08 NOTE — Progress Notes (Signed)

## 2021-08-14 ENCOUNTER — Ambulatory Visit: Payer: 59 | Attending: General Surgery | Admitting: Rehabilitation

## 2021-08-14 ENCOUNTER — Other Ambulatory Visit: Payer: Self-pay

## 2021-08-14 ENCOUNTER — Encounter: Payer: Self-pay | Admitting: Rehabilitation

## 2021-08-14 DIAGNOSIS — M25611 Stiffness of right shoulder, not elsewhere classified: Secondary | ICD-10-CM

## 2021-08-14 DIAGNOSIS — R293 Abnormal posture: Secondary | ICD-10-CM

## 2021-08-14 DIAGNOSIS — Z170 Estrogen receptor positive status [ER+]: Secondary | ICD-10-CM | POA: Diagnosis not present

## 2021-08-14 DIAGNOSIS — Z483 Aftercare following surgery for neoplasm: Secondary | ICD-10-CM

## 2021-08-14 DIAGNOSIS — C50811 Malignant neoplasm of overlapping sites of right female breast: Secondary | ICD-10-CM

## 2021-08-14 NOTE — Assessment & Plan Note (Signed)
03/14/2021:Patient palpated a lump in the retroareolar right breast with nipple retraction. Mammogram revealed right breast mass 2.6 cm and a single right axillary lymph node measuring 1.2 cm. (PET/CT negative) breast MRI revealed 4.2 cm right retroareolar mass with non-mass enhancement total measuring 14 cm, intraparenchymal lymph node 1.1 cm. 2 abnormal right axillary lymph nodes, biopsy revealed grade 2 IDC with DCIS ER 95%, PR 95%, Ki-67 20% focally 50%, HER2 negative, lymph node positive  PET CT scan: Negative for distant metastatic disease MammaPrint: High risk  Treatment plan: 1.Bilateral mastectomies 06/06/2021: [facility]: Left mastectomy: Grade 2 IDC 0.3 cm, margins negative Right mastectomy: Grade 3 IDC with DCIS 7.5 cm, margins negative, 4/4 lymph nodes positive ER 95%, PR 95%, HER2 1+, Ki-67 20% focally 50% 2.adjuvant chemotherapywith dose dense Adriamycin and Cytoxan x4 followed by Taxol x12 (patient refused chemo and radiation) 3.Adjuvant antiestrogen therapy with AI plus ovarian suppression plus or minus abemaciclib versus tamoxifen with or without abemaciclib ------------------------------------------------------------------------------------------------------------------------------------

## 2021-08-14 NOTE — Therapy (Signed)
[facility] @ [address] [place], [place], [address] Phone: [phone]   Fax:  [phone]  Physical Therapy Treatment  Patient Details  Name: Shannon Hale MRN: [id number] Date of Birth: 17-Sep-1980 Referring Provider (PT): Dr. Donne Hazel   Encounter Date: 08/14/2021   PT End of Session - 08/14/21 1549     Visit Number 4    Date for PT Re-Evaluation 08/29/21    PT Start Time 1508    PT Stop Time 1548    PT Time Calculation (min) 40 min    Activity Tolerance Patient tolerated treatment well    Behavior During Therapy [facility] for tasks assessed/performed             Past Medical History:  Diagnosis Date   Abnormal Pap smear    Headache(784.0)    migraines   Hepatitis 03/2004   unknown form of hepatitis, no current liver problems   History of chicken pox    History of D&C    D&E   Migraine    Nausea    PONV (postoperative nausea and vomiting)    Vaginal Pap smear, abnormal     Past Surgical History:  Procedure Laterality Date   BREAST RECONSTRUCTION Bilateral 06/06/2021   Procedure: BILATERAL BREAST RECONSTRUCTION WITH TISSUE REARRANGEMENT;  Surgeon: Wallace Going, DO;  Location: [facility];  Service: Plastics;  Laterality: Bilateral;   CHOLECYSTECTOMY N/A 12/01/2013   Procedure: LAPAROSCOPIC CHOLECYSTECTOMY ;  Surgeon: Harl Bowie, MD;  Location[facility];  Service: General;  Laterality: N/A;   DILATION AND EVACUATION N/A 07/14/2014   Procedure: DILATATION AND EVACUATION;  Surgeon: Marylynn Pearson, MD;  Location: [facility];  Service: Gynecology;  Laterality: N/A;   MASTECTOMY W/ SENTINEL NODE BIOPSY Right 06/06/2021   Procedure: RIGHT MASTECTOMY WITH AXILLARY SENTINEL LYMPH NODE BIOPSY;  Surgeon: Rolm Bookbinder, MD;  Location: [facility];  Service: General;  Laterality: Right;   RADIOACTIVE SEED GUIDED AXILLARY SENTINEL LYMPH NODE Right 06/06/2021   Procedure:  RADIOACTIVE SEED GUIDED RIGHT AXILLARY SENTINEL LYMPH NODE EXCISION;  Surgeon: Rolm Bookbinder, MD;  Location: [facility];  Service: General;  Laterality: Right;   SEPTOPLASTY     TONSILLECTOMY     TOTAL MASTECTOMY Left 06/06/2021   Procedure: LEFT TOTAL MASTECTOMY;  Surgeon: Rolm Bookbinder, MD;  Location: [facility];  Service: General;  Laterality: Left;   WISDOM TOOTH EXTRACTION      There were no vitals filed for this visit.   Subjective Assessment - 08/14/21 1509     Subjective A little soreness by the end of the day. I am now done with treatment except for deactivating the ovaries - no radiation or chemotherapy.    Pertinent History right breast cancer ER/PR+, Her2-, Ki 67 20% . Bilateral mastectomy with no reconstruction 06/06/21. No chemotherapy. Still thinking about radiation.  4/4 nodes positive on the Rt.  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  She was told in the past that she has "scapular rotation" on the right    Currently in Pain? No/denies                [facility] PT Assessment - 08/14/21 0001       AROM   Right Shoulder Flexion 162 Degrees   minimal pull   Right Shoulder ABduction 160 Degrees   minimal pull  [facility] Adult PT Treatment/Exercise - 08/14/21 0001       Exercises   Exercises Other Exercises    Other Exercises  performed each strength ABC exercise with 2# x 10, including bicep curls, tricep extension, single arm row, scaption, weighted heel raises, weighted squats, and doorway stretch per instruction section with lengthy discussion about progression of weight, return to pre surgery exercises levels, and when to try karate class                     PT Education - 08/14/21 1548     Education Details strength ABC    Person(s) Educated Patient    Methods Explanation;Demonstration;Handout    Comprehension Verbalized understanding;Returned  demonstration;Verbal cues required                 PT Long Term Goals - 08/14/21 1551       PT LONG TERM GOAL #1   Title Pt will return to baseline AROM on bil shoulders    Status Achieved      PT LONG TERM GOAL #2   Title Pt will be ind with HEP for return to weight lifting at home    Status Achieved      PT LONG TERM GOAL #3   Title Pt will obtain compression sleeve for work    Status Achieved                   Plan - 08/14/21 1549     Clinical Impression Statement Pt has gained the last AROM back to baseline, has returned to work, and is ready to return to pre-surgical weight lifting and activities.  Went over strength ABC with pt familiar with all of the exercises and has been doing them in the past with up to 15#.  Pt is ready for DC at this time except for sozo screening.    PT Treatment/Interventions ADLs/Self Care Home Management;Therapeutic exercise;Therapeutic activities;Patient/family education;Manual techniques    PT Next Visit Plan SOZO    Consulted and Agree with Plan of Care Patient             Patient will benefit from skilled therapeutic intervention in order to improve the following deficits and impairments:     Visit Diagnosis: Carcinoma of overlapping sites of right breast in female, estrogen receptor positive ([place])  Aftercare following surgery for neoplasm  Stiffness of right shoulder, not elsewhere classified  Abnormal posture     Problem List Patient Active Problem List   Diagnosis Date Noted   S/P bilateral mastectomy 06/06/2021   Breast cancer ([place]) 03/27/2021   Carcinoma of overlapping sites of right breast in female, estrogen receptor positive ([place]) 03/22/2021   Headache 06/26/2020   Rash 06/26/2020   New daily persistent headache 05/21/2018   Disorder of ligament of left hand 07/20/2015   Obesity 09/25/2014   Dermatitis 12/31/2013   Cholelithiasis 02/29/2012   BACK PAIN, THORACIC REGION 11/19/2010   APHTHOUS  STOMATITIS 08/10/2009   MIGRAINE HEADACHE 09/23/2008    Stark Bray, PT 08/14/2021, 3:52 PM  [facility] @ [address] [place], [place], [address] Phone: [phone]   Fax:  [phone]  Name: Shannon Hale MRN: [id number] Date of Birth: Sep 25, 1980

## 2021-08-14 NOTE — Progress Notes (Signed)
Patient Care Team: Copland, Gay Filler, MD as PCP - General (Family Medicine) Theodore Demark, RN as Oncology Nurse Navigator Carlynn Spry, Charlott Holler, RN as Oncology Nurse Navigator Mauro Kaufmann, RN as Oncology Nurse Navigator  DIAGNOSIS:    ICD-10-CM   1. Carcinoma of overlapping sites of right breast in female, estrogen receptor positive ([place])  C50.811 CBC with Differential ([place])   Z17.0 CMP ([place] only)      SUMMARY OF ONCOLOGIC HISTORY: Oncology History  Carcinoma of overlapping sites of right breast in female, estrogen receptor positive ([place])  03/14/2021 Initial Diagnosis   Patient palpated a lump in the retroareolar right breast with nipple retraction.  Mammogram revealed right breast mass 2.6 cm and a single right axillary lymph node measuring 1.2 cm.  (PET/CT negative) breast MRI revealed 4.2 cm right retroareolar mass with non-mass enhancement total measuring 14 cm, intraparenchymal lymph node 1.1 cm.  2 abnormal right axillary lymph nodes, biopsy revealed grade 2 IDC with DCIS ER 95%, PR 95%, Ki-67 20% focally 50%, HER2 negative, lymph node positive   04/10/2021 Cancer Staging   Staging form: Breast, AJCC 8th Edition - Clinical: Stage IIA (cT2, cN1, cM0, G2, ER+, PR+, HER2-) - Signed by Nicholas Lose, MD on 04/10/2021 Stage prefix: Initial diagnosis Histologic grading system: 3 grade system    04/24/2021 -  Anti-estrogen oral therapy   Tamoxifen 10 mg daily started neoadjuvantly because of slight delay in surgery   06/06/2021 Surgery   Bilateral mastectomies 06/06/2021: [facility]: Left mastectomy: Grade 2 IDC 0.3 cm, margins negative Right mastectomy: Grade 3 IDC with DCIS 7.5 cm, margins negative, 4/4 lymph nodes positive ER 95%, PR 95%, HER2 1+, Ki-67 20% focally 50%     CHIEF COMPLIANT: Follow-up of right breast cancer  INTERVAL HISTORY: Shannon Hale is a 40 y.o. with above-mentioned history of right breast cancer having undergone bilateral  mastectomies. She presents to the clinic today for follow-up.  She refused radiation and adjuvant chemotherapy.  She is here today to discuss to hold off adjuvant antiestrogen therapy with complete estrogen blockade along with CDK 4 and 6 inhibitor therapy.  She has healed and recovered very well from prior bilateral mastectomy surgery.  ALLERGIES:  has No Known Allergies.  MEDICATIONS:  Current Outpatient Medications  Medication Sig Dispense Refill   abemaciclib (VERZENIO) 50 MG tablet Take 1 tablet (50 mg total) by mouth 2 (two) times daily. Swallow tablets whole. Do not chew, crush, or split tablets before swallowing. 60 tablet 0   letrozole (FEMARA) 2.5 MG tablet Take 1 tablet (2.5 mg total) by mouth daily. 90 tablet 3   Acetylcysteine (N-ACETYL-L-CYSTEINE) 600 MG CAPS Take 600 mg by mouth.     AMBULATORY NON FORMULARY MEDICATION Eg Cg Boost supplement     APPLE CIDER VINEGAR PO Take 450 mg by mouth.     ascorbic acid (VITAMIN C) 1000 MG tablet Take by mouth.     cephALEXin (KEFLEX) 500 MG capsule Take 1 capsule (500 mg total) by mouth 2 (two) times daily. 14 capsule 0   Cholecalciferol (VITAMIN D-3) 125 MCG (5000 UT) TABS Take 125 mcg by mouth.     influenza vac split quadrivalent PF (FLUARIX QUADRIVALENT) 0.5 ML injection Inject into the muscle. 0.5 mL 0   l-methylfolate-B6-B12 (METANX) 3-35-2 MG TABS tablet Take 1 tablet by mouth daily.     Magnesium 250 MG TABS Take by mouth.     methocarbamol (ROBAXIN) 500 MG tablet Take 1 tablet (  500 mg total) by mouth 2 (two) times daily as needed for muscle spasms. 14 tablet 0   NON FORMULARY      ondansetron (ZOFRAN ODT) 4 MG disintegrating tablet Take 1 tablet (4 mg total) by mouth every 8 (eight) hours as needed for nausea or vomiting. 20 tablet 0   ondansetron (ZOFRAN) 4 MG tablet Take 1 tablet (4 mg total) by mouth every 8 (eight) hours as needed for nausea or vomiting. 30 tablet 2   OVER THE COUNTER MEDICATION Take 15 mg by mouth.  Bio-Quercetin Phytosome by [employer]     OVER THE COUNTER MEDICATION Take 200 mcg by mouth. Super Selenium Complex 200 mcg with Vitamin E 20 mg     Rimegepant Sulfate (NURTEC) 75 MG TBDP Use one tablet daily as needed for migraine 5 tablet 1   sulfamethoxazole-trimethoprim (BACTRIM DS) 800-160 MG tablet Take 1 tablet by mouth 2 (two) times daily. 10 tablet 0   tamoxifen (NOLVADEX) 20 MG tablet Take 0.5 tablets (10 mg total) by mouth daily. 30 tablet 0   Turmeric 450 MG CAPS Take by mouth.     vitamin E 180 MG (400 UNITS) capsule Take 400 Units by mouth daily.     zinc gluconate 50 MG tablet Take by mouth.     No current facility-administered medications for this visit.    PHYSICAL EXAMINATION: ECOG PERFORMANCE STATUS: 1 - Symptomatic but completely ambulatory  Vitals:   08/15/21 1507  BP: (!) 141/80  Pulse: 73  Resp: 18  Temp: 97.8 F (36.6 C)  SpO2: 100%   Filed Weights   08/15/21 1507  Weight: 199 lb 8 oz (90.5 kg)       LABORATORY DATA:  I have reviewed the data as listed CMP Latest Ref Rng & Units 03/20/2021 12/03/2017 11/25/2013  Glucose 70 - 99 mg/dL 106(H) 92 91  BUN 6 - 20 mg/dL 8 5(L) 8  Creatinine 0.44 - 1.00 mg/dL 0.75 0.71 0.75  Sodium 135 - 145 mmol/L 141 137 140  Potassium 3.5 - 5.1 mmol/L 4.0 4.2 4.2  Chloride 98 - 111 mmol/L 107 104 103  CO2 22 - 32 mmol/L '26 26 26  ' Calcium 8.9 - 10.3 mg/dL 10.0 9.1 10.1  Total Protein 6.5 - 8.1 g/dL 7.6 7.6 8.2  Total Bilirubin 0.3 - 1.2 mg/dL 0.5 0.4 0.3  Alkaline Phos 38 - 126 U/L 61 76 79  AST 15 - 41 U/L '17 26 21  ' ALT 0 - 44 U/L '15 22 16    ' Lab Results  Component Value Date   WBC 5.9 03/20/2021   HGB 12.9 03/20/2021   HCT 37.6 03/20/2021   MCV 83.6 03/20/2021   PLT 283 03/20/2021   NEUTROABS 3.4 03/20/2021    ASSESSMENT & PLAN:  Carcinoma of overlapping sites of right breast in female, estrogen receptor positive ([place]) 03/14/2021: Patient palpated a lump in the retroareolar right breast with nipple  retraction.  Mammogram revealed right breast mass 2.6 cm and a single right axillary lymph node measuring 1.2 cm.  (PET/CT negative) breast MRI revealed 4.2 cm right retroareolar mass with non-mass enhancement total measuring 14 cm, intraparenchymal lymph node 1.1 cm.  2 abnormal right axillary lymph nodes, biopsy revealed grade 2 IDC with DCIS ER 95%, PR 95%, Ki-67 20% focally 50%, HER2 negative, lymph node positive   PET CT scan: Negative for distant metastatic disease MammaPrint: High risk   Treatment plan: 1.  Bilateral mastectomies 06/06/2021: [facility]: Left mastectomy: Grade 2  IDC 0.3 cm, margins negative Right mastectomy: Grade 3 IDC with DCIS 7.5 cm, margins negative, 4/4 lymph nodes positive ER 95%, PR 95%, HER2 1+, Ki-67 20% focally 50% 2.  adjuvant chemotherapy with dose dense Adriamycin and Cytoxan x4 followed by Taxol x12 (patient refused chemo and radiation) 3. Adjuvant antiestrogen therapy with AI plus ovarian suppression plus or minus abemaciclib versus tamoxifen with or without abemaciclib ------------------------------------------------------------------------------------------------------------------------------------ Abemaciclib counseling: I discussed at length the risks and benefits of Abemaciclib in combination with letrozole. Adverse effects of Abemaciclib include decreasing neutrophil count, pneumonia, blood clots in lungs as well as nausea and GI symptoms. Side effects of letrozole include hot flashes, muscle aches and pains, uterine bleeding/spotting/cancer, osteoporosis, risk of blood clots.  Plan to start Zoladex injection in 1 week Sent abemaciclib 50 mg twice daily prescription.  If she tolerates it well we will increase the dosage to 100 mg twice daily. Sent a prescription for letrozole  I will see her back in 5 weeks for her next injection of Zoladex.  If she tolerates Zoladex well, she might consider doing oophorectomy  Orders Placed This Encounter  Procedures    CBC with Differential ([place] Only)    Standing Status:   Future    Standing Expiration Date:   08/15/2022   CMP ([place] only)    Standing Status:   Future    Standing Expiration Date:   08/15/2022    The patient has a good understanding of the overall plan. she agrees with it. she will call with any problems that may develop before the next visit here.  Total time spent: 30 mins including face to face time and time spent for planning, charting and coordination of care  Rulon Eisenmenger, MD, MPH 08/15/2021  I, Thana Ates, am acting as scribe for Dr. Nicholas Lose.  I have reviewed the above documentation for accuracy and completeness, and I agree with the above.

## 2021-08-14 NOTE — Patient Instructions (Signed)
Access Code: [email or web address]: [email or web address]: 12/13/2022Prepared by: Mahlon Gammon Notes Do these exercises 2-3 times a week making sure you have a day of rest in between sessions. Before you start a session, warm up with a brisk walk and stretches for your arms, legs and back. Start with 2 sets of 10 of each exercise If you have no pain or swelling, next time do 3 sets of 10. If still no pain the next session, its ok to add some small weight, but back to down to 2 sets of 10 . the next time do 3 sets of 10 with that weight and continue to progress with that pattern. If you increase weight, decrease reps, or if you increase reps, keep weight the same. If you do have symptoms, back off on your weight or reps. If for some reason, you do skip some scheduled exercise sessions, start over with minimal weight and build up slowly again.  Your overall exercise goal is about 150 minutes of moderate intensity (you can talk but not sing) exercise a week that includes 2 sessions of strength training. GO FOR IT! :) Exercises  Wall Push Up - 2 x weekly - 2-3 sets - 10 reps  Standing Bent Over Single Arm Shoulder Row - 2 x weekly - 2-3 sets - 10 reps  Scaption with Dumbbells - 2 x weekly - 2-3 sets - 10 reps  Standing Alternating Bicep Curls with Dumbbells and Rotation - 2 x weekly - 2-3 sets - 10 reps  Standing Heel Raise - 2 x weekly - 2-3 sets - 10 reps  Dumbbell Squat at Shoulders - 7 x weekly - 1-3 sets - 10 reps Patient Education  walking program

## 2021-08-15 ENCOUNTER — Inpatient Hospital Stay: Payer: 59 | Attending: Hematology and Oncology | Admitting: Hematology and Oncology

## 2021-08-15 ENCOUNTER — Other Ambulatory Visit (HOSPITAL_COMMUNITY): Payer: Self-pay

## 2021-08-15 ENCOUNTER — Telehealth: Payer: Self-pay | Admitting: Pharmacy Technician

## 2021-08-15 ENCOUNTER — Other Ambulatory Visit: Payer: Self-pay

## 2021-08-15 ENCOUNTER — Telehealth: Payer: Self-pay

## 2021-08-15 DIAGNOSIS — Z79899 Other long term (current) drug therapy: Secondary | ICD-10-CM | POA: Insufficient documentation

## 2021-08-15 DIAGNOSIS — C50811 Malignant neoplasm of overlapping sites of right female breast: Secondary | ICD-10-CM

## 2021-08-15 DIAGNOSIS — Z170 Estrogen receptor positive status [ER+]: Secondary | ICD-10-CM

## 2021-08-15 DIAGNOSIS — C50919 Malignant neoplasm of unspecified site of unspecified female breast: Secondary | ICD-10-CM

## 2021-08-15 DIAGNOSIS — Z9013 Acquired absence of bilateral breasts and nipples: Secondary | ICD-10-CM | POA: Insufficient documentation

## 2021-08-15 DIAGNOSIS — Z5112 Encounter for antineoplastic immunotherapy: Secondary | ICD-10-CM | POA: Insufficient documentation

## 2021-08-15 MED ORDER — LETROZOLE 2.5 MG PO TABS
2.5000 mg | ORAL_TABLET | Freq: Every day | ORAL | 3 refills | Status: DC
  Filled 2021-08-15: qty 90, 90d supply, fill #0
  Filled 2021-12-31: qty 90, 90d supply, fill #1
  Filled 2022-04-25: qty 90, 90d supply, fill #2
  Filled 2022-07-23: qty 90, 90d supply, fill #3

## 2021-08-15 MED ORDER — ABEMACICLIB 50 MG PO TABS
50.0000 mg | ORAL_TABLET | Freq: Two times a day (BID) | ORAL | 0 refills | Status: DC
  Filled 2021-08-15: qty 70, 35d supply, fill #0

## 2021-08-15 NOTE — Telephone Encounter (Signed)
Oral Oncology Patient Advocate Encounter  Prior Authorization for Verzenio (50mg ) has been approved.    PA# [id number] Effective dates: 08/15/21 through 08/14/22. Max of 12 fills.  Patients co-pay is $0.00  Oral Oncology Clinic will continue to follow.   [place] Patient [place] Phone [phone] Fax [phone] 08/15/2021 4:05 PM

## 2021-08-15 NOTE — Telephone Encounter (Signed)
Oral Oncology Patient Advocate Encounter   Received notification from [facility] that prior authorization for Verzenio is required.   PA submitted on CoverMyMeds Key [id number] Status is pending   Oral Oncology Clinic will continue to follow.  [facility] [place] Phone [phone] Fax [phone] 08/15/2021 4:02 PM

## 2021-08-15 NOTE — Telephone Encounter (Signed)
Oral Oncology Pharmacist Encounter  Received new prescription for abemaciclib (Verzenio) for the treatment of hormone receptor positive, HER2 negative breast cancer in conjunction with letrozole, planned duration until disease progression or unacceptable toxicity.  Labs from 03/20/2021 assessed, no interventions needed.  Current medication list in Epic reviewed, DDIs with verzenio identified: none  Evaluated chart and no patient barriers to medication adherence noted.   Patient agreement for treatment documented in MD note on 08/15/21.  Prescription has been e-scribed to the [facility] for benefits analysis and approval.  Oral Oncology Clinic will continue to follow for insurance authorization, copayment issues, initial counseling and start date.  Drema Halon, PharmD Hematology/Oncology Clinical Pharmacist [facility] [phone] 08/15/2021 4:00 PM

## 2021-08-16 ENCOUNTER — Other Ambulatory Visit (HOSPITAL_COMMUNITY): Payer: Self-pay

## 2021-08-16 ENCOUNTER — Encounter: Payer: Self-pay | Admitting: *Deleted

## 2021-08-17 ENCOUNTER — Other Ambulatory Visit (HOSPITAL_COMMUNITY): Payer: Self-pay

## 2021-08-17 MED ORDER — ABEMACICLIB 50 MG PO TABS
50.0000 mg | ORAL_TABLET | Freq: Two times a day (BID) | ORAL | 0 refills | Status: DC
  Filled 2021-08-17 – 2021-08-20 (×2): qty 56, 28d supply, fill #0

## 2021-08-17 NOTE — Telephone Encounter (Signed)
Oral Chemotherapy Pharmacist Encounter  I spoke with patient for overview of: Verzenio for the treatment of hormone-receptor positive breast cancer, in combination with letrozole, planned duration until disease progression or unacceptable toxicity.   Counseled patient on administration, dosing, side effects, monitoring, drug-food interactions, safe handling, storage, and disposal.  Patient will take Verzenio 50mg  tablets, 1 tablet by mouth twice daily without regard to food. If patient tolerates, dose will be increased after 1 month.   Patient knows to avoid grapefruit and grapefruit juice.  Verzenio start date: 08/22/2021  Adverse effects include but are not limited to: diarrhea, fatigue, nausea, abdominal pain, decreased blood counts, and increased liver function tests, and joint pains. Severe, life-threatening, and/or fatal interstitial lung disease (ILD) and/or pneumonitis may occur with CDK 4/6 inhibitors.  Patient has anti-emetic on hand and knows to take it if nausea develops.   Patient will obtain anti diarrheal and alert the office of 4 or more loose stools above baseline.  Reviewed with patient importance of keeping a medication schedule and plan for any missed doses. No barriers to medication adherence identified.  Medication reconciliation performed and medication/allergy list updated.   Insurance authorization for [employer] has been obtained. Test claim at the pharmacy revealed copayment $0 for 1st fill of 28 days. This will ship from the [facility] on 08/20/21 to deliver to patient's home on 08/21/21.  Patient informed the pharmacy will reach out 5-7 days prior to needing next fill of Verzenio to coordinate continued medication acquisition to prevent break in therapy.  All questions answered.  Shannon Hale voiced understanding and appreciation.   Medication education handout placed in mail for patient. Patient knows to call the office with questions or  concerns. Oral Chemotherapy Clinic phone number provided to patient.   Drema Halon, PharmD Hematology/Oncology Clinical Pharmacist [facility] [phone]

## 2021-08-20 ENCOUNTER — Other Ambulatory Visit (HOSPITAL_COMMUNITY): Payer: Self-pay

## 2021-08-22 ENCOUNTER — Other Ambulatory Visit: Payer: Self-pay

## 2021-08-22 ENCOUNTER — Inpatient Hospital Stay: Payer: 59

## 2021-08-22 VITALS — BP 129/79 | HR 84 | Temp 98.2°F | Resp 20

## 2021-08-22 DIAGNOSIS — Z9013 Acquired absence of bilateral breasts and nipples: Secondary | ICD-10-CM | POA: Diagnosis not present

## 2021-08-22 DIAGNOSIS — Z79899 Other long term (current) drug therapy: Secondary | ICD-10-CM | POA: Diagnosis not present

## 2021-08-22 DIAGNOSIS — Z170 Estrogen receptor positive status [ER+]: Secondary | ICD-10-CM

## 2021-08-22 DIAGNOSIS — C50811 Malignant neoplasm of overlapping sites of right female breast: Secondary | ICD-10-CM | POA: Diagnosis not present

## 2021-08-22 DIAGNOSIS — Z5112 Encounter for antineoplastic immunotherapy: Secondary | ICD-10-CM | POA: Diagnosis not present

## 2021-08-22 MED ORDER — GOSERELIN ACETATE 3.6 MG ~~LOC~~ IMPL
3.6000 mg | DRUG_IMPLANT | Freq: Once | SUBCUTANEOUS | Status: AC
  Administered 2021-08-22: 16:00:00 3.6 mg via SUBCUTANEOUS
  Filled 2021-08-22: qty 3.6

## 2021-08-22 NOTE — Patient Instructions (Signed)
Goserelin injection °What is this medication? °GOSERELIN (GOE se rel in) is similar to a hormone found in the body. It lowers the amount of sex hormones that the body makes. Men will have lower testosterone levels and women will have lower estrogen levels while taking this medicine. In men, this medicine is used to treat prostate cancer; the injection is either given once per month or once every 12 weeks. A once per month injection (only) is used to treat women with endometriosis, dysfunctional uterine bleeding, or advanced breast cancer. °This medicine may be used for other purposes; ask your health care provider or pharmacist if you have questions. °COMMON BRAND NAME(S): Zoladex, Zoladex 3-Month °What should I tell my care team before I take this medication? °They need to know if you have any of these conditions: °bone problems °diabetes °heart disease °history of irregular heartbeat °an unusual or allergic reaction to goserelin, other medicines, foods, dyes, or preservatives °pregnant or trying to get pregnant °breast-feeding °How should I use this medication? °This medicine is for injection under the skin. It is given by a health care professional in a hospital or clinic setting. °Talk to your pediatrician regarding the use of this medicine in children. Special care may be needed. °Overdosage: If you think you have taken too much of this medicine contact a poison control center or emergency room at once. °NOTE: This medicine is only for you. Do not share this medicine with others. °What if I miss a dose? °It is important not to miss your dose. Call your doctor or health care professional if you are unable to keep an appointment. °What may interact with this medication? °Do not take this medicine with any of the following medications: °cisapride °dronedarone °pimozide °thioridazine °This medicine may also interact with the following medications: °other medicines that prolong the QT interval (an abnormal heart  rhythm) °This list may not describe all possible interactions. Give your health care provider a list of all the medicines, herbs, non-prescription drugs, or dietary supplements you use. Also tell them if you smoke, drink alcohol, or use illegal drugs. Some items may interact with your medicine. °What should I watch for while using this medication? °Visit your doctor or health care provider for regular checks on your progress. Your symptoms may appear to get worse during the first weeks of this therapy. Tell your doctor or healthcare provider if your symptoms do not start to get better or if they get worse after this time. °Your bones may get weaker if you take this medicine for a long time. If you smoke or frequently drink alcohol you may increase your risk of bone loss. A family history of osteoporosis, chronic use of drugs for seizures (convulsions), or corticosteroids can also increase your risk of bone loss. Talk to your doctor about how to keep your bones strong. °This medicine should stop regular monthly menstruation in women. Tell your doctor if you continue to menstruate. °Women should not become pregnant while taking this medicine or for 12 weeks after stopping this medicine. Women should inform their doctor if they wish to become pregnant or think they might be pregnant. There is a potential for serious side effects to an unborn child. Talk to your health care professional or pharmacist for more information. Do not breast-feed an infant while taking this medicine. °Men should inform their doctors if they wish to father a child. This medicine may lower sperm counts. Talk to your health care professional or pharmacist for more information. °This   medicine may increase blood sugar. Ask your healthcare provider if changes in diet or medicines are needed if you have diabetes. °What side effects may I notice from receiving this medication? °Side effects that you should report to your doctor or health care  professional as soon as possible: °allergic reactions like skin rash, itching or hives, swelling of the face, lips, or tongue °bone pain °breathing problems °changes in vision °chest pain °feeling faint or lightheaded, falls °fever, chills °pain, swelling, warmth in the leg °pain, tingling, numbness in the hands or feet °signs and symptoms of high blood sugar such as being more thirsty or hungry or having to urinate more than normal. You may also feel very tired or have blurry vision °signs and symptoms of low blood pressure like dizziness; feeling faint or lightheaded, falls; unusually weak or tired °stomach pain °swelling of the ankles, feet, hands °trouble passing urine or change in the amount of urine °unusually high or low blood pressure °unusually weak or tired °Side effects that usually do not require medical attention (report to your doctor or health care professional if they continue or are bothersome): °change in sex drive or performance °changes in breast size in both males and females °changes in emotions or moods °headache °hot flashes °irritation at site where injected °loss of appetite °skin problems like acne, dry skin °vaginal dryness °This list may not describe all possible side effects. Call your doctor for medical advice about side effects. You may report side effects to FDA at 1-800-FDA-1088. °Where should I keep my medication? °This drug is given in a hospital or clinic and will not be stored at home. °NOTE: This sheet is a summary. It may not cover all possible information. If you have questions about this medicine, talk to your doctor, pharmacist, or health care provider. °© 2022 [employer] (2018-12-18 00:00:00) ° °

## 2021-09-06 ENCOUNTER — Other Ambulatory Visit (HOSPITAL_COMMUNITY): Payer: Self-pay

## 2021-09-10 ENCOUNTER — Other Ambulatory Visit (HOSPITAL_COMMUNITY): Payer: Self-pay

## 2021-09-10 ENCOUNTER — Ambulatory Visit: Payer: 59 | Attending: General Surgery

## 2021-09-10 ENCOUNTER — Other Ambulatory Visit: Payer: Self-pay | Admitting: Hematology and Oncology

## 2021-09-10 ENCOUNTER — Other Ambulatory Visit: Payer: Self-pay

## 2021-09-10 VITALS — Wt 207.0 lb

## 2021-09-10 DIAGNOSIS — C50919 Malignant neoplasm of unspecified site of unspecified female breast: Secondary | ICD-10-CM

## 2021-09-10 DIAGNOSIS — Z483 Aftercare following surgery for neoplasm: Secondary | ICD-10-CM | POA: Insufficient documentation

## 2021-09-10 NOTE — Therapy (Signed)
[facility] @ [facility] [place], [place], [address] Phone: [phone]   Fax:  [phone]  Physical Therapy Treatment  Patient Details  Name: Shannon Hale MRN: [id number] Date of Birth: [birth year] Referring Provider (PT): Dr. Donne Hazel   Encounter Date: 09/10/2021   PT End of Session - 09/10/21 1657     Visit Number 4   # unchanged due to screen only   PT Start Time 6962    PT Stop Time 1659    PT Time Calculation (min) 5 min    Activity Tolerance Patient tolerated treatment well    Behavior During Therapy [facility] for tasks assessed/performed             Past Medical History:  Diagnosis Date   Abnormal Pap smear    Headache(784.0)    migraines   Hepatitis 03/2004   unknown form of hepatitis, no current liver problems   History of chicken pox    History of D&C    D&E   Migraine    Nausea    PONV (postoperative nausea and vomiting)    Vaginal Pap smear, abnormal     Past Surgical History:  Procedure Laterality Date   BREAST RECONSTRUCTION Bilateral 06/06/2021   Procedure: BILATERAL BREAST RECONSTRUCTION WITH TISSUE REARRANGEMENT;  Surgeon: Wallace Going, DO;  Location: [facility];  Service: Plastics;  Laterality: Bilateral;   CHOLECYSTECTOMY N/A 12/01/2013   Procedure: LAPAROSCOPIC CHOLECYSTECTOMY ;  Surgeon: Harl Bowie, MD;  Location: [facility];  Service: General;  Laterality: N/A;   DILATION AND EVACUATION N/A 07/14/2014   Procedure: DILATATION AND EVACUATION;  Surgeon: Marylynn Pearson, MD;  Location: [facility];  Service: Gynecology;  Laterality: N/A;   MASTECTOMY W/ SENTINEL NODE BIOPSY Right 06/06/2021   Procedure: RIGHT MASTECTOMY WITH AXILLARY SENTINEL LYMPH NODE BIOPSY;  Surgeon: Rolm Bookbinder, MD;  Location: [facility];  Service: General;  Laterality: Right;   RADIOACTIVE SEED GUIDED AXILLARY SENTINEL LYMPH NODE Right 06/06/2021   Procedure: RADIOACTIVE SEED  GUIDED RIGHT AXILLARY SENTINEL LYMPH NODE EXCISION;  Surgeon: Rolm Bookbinder, MD;  Location: [facility];  Service: General;  Laterality: Right;   SEPTOPLASTY     TONSILLECTOMY     TOTAL MASTECTOMY Left 06/06/2021   Procedure: LEFT TOTAL MASTECTOMY;  Surgeon: Rolm Bookbinder, MD;  Location: [facility];  Service: General;  Laterality: Left;   WISDOM TOOTH EXTRACTION      Vitals:   09/10/21 1656  Weight: 207 lb (93.9 kg)     Subjective Assessment - 09/10/21 1655     Subjective Pt returns for her 3 month L-Dex screen.    Pertinent History right breast cancer ER/PR+, Her2-, Ki 67 20% . Bilateral mastectomy with no reconstruction 06/06/21. No chemotherapy. Still thinking about radiation.  4/4 nodes positive on the Rt.  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  She was told in the past that she has "scapular rotation" on the right                    L-DEX FLOWSHEETS - 09/10/21 1600       L-DEX LYMPHEDEMA SCREENING   Measurement Type Unilateral    L-DEX MEASUREMENT EXTREMITY Upper Extremity    POSITION  Standing    DOMINANT SIDE Right    At Risk Side Right    BASELINE SCORE (UNILATERAL) -3.3    L-DEX SCORE (UNILATERAL) -3.4  VALUE CHANGE (UNILAT) -0.1                                     PT Long Term Goals - 08/14/21 1551       PT LONG TERM GOAL #1   Title Pt will return to baseline AROM on bil shoulders    Status Achieved      PT LONG TERM GOAL #2   Title Pt will be ind with HEP for return to weight lifting at home    Status Achieved      PT LONG TERM GOAL #3   Title Pt will obtain compression sleeve for work    Status Achieved                   Plan - 09/10/21 1705     Clinical Impression Statement Pt returns for her 3 month L-Dex screen. Her change from baseline of -0.1 is WNLs so no further treatment is required at this time except to cont every 3 month L-Dex  screens which pt is agreeable to.    PT Next Visit Plan Cont every 3 month L-Dex screens for up to 2 years from her SLNB (~06/07/2023)    Consulted and Agree with Plan of Care Patient             Patient will benefit from skilled therapeutic intervention in order to improve the following deficits and impairments:     Visit Diagnosis: Aftercare following surgery for neoplasm     Problem List Patient Active Problem List   Diagnosis Date Noted   S/P bilateral mastectomy 06/06/2021   Breast cancer ([place]) 03/27/2021   Carcinoma of overlapping sites of right breast in female, estrogen receptor positive ([place]) 03/22/2021   Headache 06/26/2020   Rash 06/26/2020   New daily persistent headache 05/21/2018   Disorder of ligament of left hand 07/20/2015   Obesity 09/25/2014   Dermatitis 12/31/2013   Cholelithiasis 02/29/2012   BACK PAIN, THORACIC REGION 11/19/2010   APHTHOUS STOMATITIS 08/10/2009   MIGRAINE HEADACHE 09/23/2008    Otelia Limes, PTA 09/10/2021, 5:07 PM  [facility] @ [address] [place], [place], [address] Phone: [phone]   Fax:  [phone]  Name: ALISSON ROZELL MRN: [id number] Date of Birth: December 28, 1980

## 2021-09-11 ENCOUNTER — Other Ambulatory Visit (HOSPITAL_COMMUNITY): Payer: Self-pay

## 2021-09-14 ENCOUNTER — Other Ambulatory Visit: Payer: Self-pay

## 2021-09-14 ENCOUNTER — Telehealth: Payer: 59 | Admitting: Physician Assistant

## 2021-09-14 DIAGNOSIS — R3989 Other symptoms and signs involving the genitourinary system: Secondary | ICD-10-CM

## 2021-09-14 MED ORDER — SULFAMETHOXAZOLE-TRIMETHOPRIM 800-160 MG PO TABS
1.0000 | ORAL_TABLET | Freq: Two times a day (BID) | ORAL | 0 refills | Status: DC
  Filled 2021-09-14: qty 10, 5d supply, fill #0

## 2021-09-14 NOTE — Progress Notes (Signed)

## 2021-09-17 NOTE — Progress Notes (Signed)
Patient Care Team: Copland, Gay Filler, MD as PCP - General (Family Medicine) Theodore Demark, RN as Oncology Nurse Navigator Carlynn Spry, Charlott Holler, RN as Oncology Nurse Navigator Mauro Kaufmann, RN as Oncology Nurse Navigator  DIAGNOSIS:    ICD-10-CM   1. Carcinoma of overlapping sites of right breast in female, estrogen receptor positive ([place])  C50.811 CBC with Differential ([place])   Z17.0 CMP ([facility] only)    2. Malignant neoplasm of female breast, unspecified estrogen receptor status, unspecified laterality, unspecified site of breast ([place])  C50.919 abemaciclib (VERZENIO) 100 MG tablet    CBC with Differential ([place])    CMP ([facility] only)      SUMMARY OF ONCOLOGIC HISTORY: Oncology History  Carcinoma of overlapping sites of right breast in female, estrogen receptor positive ([place])  03/14/2021 Initial Diagnosis   Patient palpated a lump in the retroareolar right breast with nipple retraction.  Mammogram revealed right breast mass 2.6 cm and a single right axillary lymph node measuring 1.2 cm.  (PET/CT negative) breast MRI revealed 4.2 cm right retroareolar mass with non-mass enhancement total measuring 14 cm, intraparenchymal lymph node 1.1 cm.  2 abnormal right axillary lymph nodes, biopsy revealed grade 2 IDC with DCIS ER 95%, PR 95%, Ki-67 20% focally 50%, HER2 negative, lymph node positive   04/10/2021 Cancer Staging   Staging form: Breast, AJCC 8th Edition - Clinical: Stage IIA (cT2, cN1, cM0, G2, ER+, PR+, HER2-) - Signed by Nicholas Lose, MD on 04/10/2021 Stage prefix: Initial diagnosis Histologic grading system: 3 grade system    04/24/2021 -  Anti-estrogen oral therapy   Tamoxifen 10 mg daily started neoadjuvantly because of slight delay in surgery   06/06/2021 Surgery   Bilateral mastectomies 06/06/2021: [facility]: Left mastectomy: Grade 2 IDC 0.3 cm, margins negative Right mastectomy: Grade 3 IDC with DCIS 7.5 cm, margins negative, 4/4  lymph nodes positive ER 95%, PR 95%, HER2 1+, Ki-67 20% focally 50%     CHIEF COMPLIANT: Follow-up of right breast cancer  INTERVAL HISTORY: Shannon Hale is a 41 y.o. with above-mentioned history of right breast cancer having undergone bilateral mastectomies. She presents to the clinic today for follow-up.  She is tolerating Verzenio extremely well at 50 mg p.o. twice daily dose.  She does not have any diarrhea.  Very mild hot flashes from Zoladex and letrozole.  She still had 1 menstrual cycle last month that lasted a longer.  ALLERGIES:  has No Known Allergies.  MEDICATIONS:  Current Outpatient Medications  Medication Sig Dispense Refill   [START ON 10/02/2021] abemaciclib (VERZENIO) 100 MG tablet Take 1 tablet (100 mg total) by mouth 2 (two) times daily. Swallow tablets whole. Do not chew, crush, or split tablets before swallowing. 60 tablet 0   Acetylcysteine (N-ACETYL-L-CYSTEINE) 600 MG CAPS Take 600 mg by mouth.     AMBULATORY NON FORMULARY MEDICATION Eg Cg Boost supplement     APPLE CIDER VINEGAR PO Take 450 mg by mouth.     ascorbic acid (VITAMIN C) 1000 MG tablet Take by mouth.     Cholecalciferol (VITAMIN D-3) 125 MCG (5000 UT) TABS Take 125 mcg by mouth.     influenza vac split quadrivalent PF (FLUARIX QUADRIVALENT) 0.5 ML injection Inject into the muscle. 0.5 mL 0   l-methylfolate-B6-B12 (METANX) 3-35-2 MG TABS tablet Take 1 tablet by mouth daily.     letrozole (FEMARA) 2.5 MG tablet Take 1 tablet (2.5 mg total) by mouth daily. 90 tablet  3   Magnesium 250 MG TABS Take by mouth.     methocarbamol (ROBAXIN) 500 MG tablet Take 1 tablet (500 mg total) by mouth 2 (two) times daily as needed for muscle spasms. 14 tablet 0   NON FORMULARY      ondansetron (ZOFRAN ODT) 4 MG disintegrating tablet Take 1 tablet (4 mg total) by mouth every 8 (eight) hours as needed for nausea or vomiting. 20 tablet 0   ondansetron (ZOFRAN) 4 MG tablet Take 1 tablet (4 mg total) by mouth every 8 (eight)  hours as needed for nausea or vomiting. 30 tablet 2   OVER THE COUNTER MEDICATION Take 15 mg by mouth. Bio-Quercetin Phytosome by [employer]     OVER THE COUNTER MEDICATION Take 200 mcg by mouth. Super Selenium Complex 200 mcg with Vitamin E 20 mg     Rimegepant Sulfate (NURTEC) 75 MG TBDP Use one tablet daily as needed for migraine 5 tablet 1   sulfamethoxazole-trimethoprim (BACTRIM DS) 800-160 MG tablet Take 1 tablet by mouth 2 (two) times daily. 10 tablet 0   Turmeric 450 MG CAPS Take by mouth.     vitamin E 180 MG (400 UNITS) capsule Take 400 Units by mouth daily.     zinc gluconate 50 MG tablet Take by mouth.     No current facility-administered medications for this visit.    PHYSICAL EXAMINATION: ECOG PERFORMANCE STATUS: 1 - Symptomatic but completely ambulatory  Vitals:   09/18/21 1511  BP: 125/76  Pulse: 80  Resp: 18  Temp: 97.9 F (36.6 C)  SpO2: 100%   Filed Weights   09/18/21 1511  Weight: 204 lb (92.5 kg)      LABORATORY DATA:  I have reviewed the data as listed CMP Latest Ref Rng & Units 09/18/2021 03/20/2021 12/03/2017  Glucose 70 - 99 mg/dL 91 106(H) 92  BUN 6 - 20 mg/dL 17 8 5(L)  Creatinine 0.44 - 1.00 mg/dL 1.17(H) 0.75 0.71  Sodium 135 - 145 mmol/L 138 141 137  Potassium 3.5 - 5.1 mmol/L 4.3 4.0 4.2  Chloride 98 - 111 mmol/L 103 107 104  CO2 22 - 32 mmol/L '27 26 26  ' Calcium 8.9 - 10.3 mg/dL 10.0 10.0 9.1  Total Protein 6.5 - 8.1 g/dL 8.5(H) 7.6 7.6  Total Bilirubin 0.3 - 1.2 mg/dL 0.4 0.5 0.4  Alkaline Phos 38 - 126 U/L 91 61 76  AST 15 - 41 U/L '20 17 26  ' ALT 0 - 44 U/L '14 15 22    ' Lab Results  Component Value Date   WBC 6.4 09/18/2021   HGB 13.2 09/18/2021   HCT 39.2 09/18/2021   MCV 82.5 09/18/2021   PLT 315 09/18/2021   NEUTROABS 4.1 09/18/2021    ASSESSMENT & PLAN:  Carcinoma of overlapping sites of right breast in female, estrogen receptor positive ([place]) 03/14/2021: Patient palpated a lump in the retroareolar right breast with  nipple retraction.  Mammogram revealed right breast mass 2.6 cm and a single right axillary lymph node measuring 1.2 cm.  (PET/CT negative) breast MRI revealed 4.2 cm right retroareolar mass with non-mass enhancement total measuring 14 cm, intraparenchymal lymph node 1.1 cm.  2 abnormal right axillary lymph nodes, biopsy revealed grade 2 IDC with DCIS ER 95%, PR 95%, Ki-67 20% focally 50%, HER2 negative, lymph node positive   PET CT scan: Negative for distant metastatic disease MammaPrint: High risk   Treatment plan: 1.  Bilateral mastectomies 06/06/2021: [facility]: Left mastectomy: Grade 2 IDC  0.3 cm, margins negative Right mastectomy: Grade 3 IDC with DCIS 7.5 cm, margins negative, 4/4 lymph nodes positive ER 95%, PR 95%, HER2 1+, Ki-67 20% focally 50% 2.  adjuvant chemotherapy with dose dense Adriamycin and Cytoxan x4 followed by Taxol x12 (patient refused chemo and radiation) 3. Adjuvant antiestrogen therapy with AI plus ovarian suppression plus or minus abemaciclib versus tamoxifen with or without abemaciclib ------------------------------------------------------------------------------------------------------------------------------------ Current treatment: Zoladex with letrozole and abemaciclib started 08/22/2022  Abemaciclib toxicities: Denies any adverse effects at 50 mg p.o. twice daily dosage. I sent a new prescription for next month to be 100 mg p.o. twice daily. She will come back in 1 month for her injection and after that we will decide on whether to increase the dosage to 150 p.o. twice daily.  Return to clinic in 4 weeks to meet with Gilford Rile or clinical pharmacy specialist for follow-up    Orders Placed This Encounter  Procedures   CBC with Differential ([place] Only)    Standing Status:   Future    Standing Expiration Date:   09/18/2022   CMP ([place] only)    Standing Status:   Future    Standing Expiration Date:   09/18/2022   The patient has a good  understanding of the overall plan. she agrees with it. she will call with any problems that may develop before the next visit here.  Total time spent: 30 mins including face to face time and time spent for planning, charting and coordination of care  Rulon Eisenmenger, MD, MPH 09/18/2021  I, Thana Ates, am acting as scribe for Dr. Nicholas Lose.  I have reviewed the above documentation for accuracy and completeness, and I agree with the above.

## 2021-09-18 ENCOUNTER — Inpatient Hospital Stay: Payer: 59 | Attending: Hematology and Oncology

## 2021-09-18 ENCOUNTER — Inpatient Hospital Stay: Payer: 59 | Admitting: Hematology and Oncology

## 2021-09-18 ENCOUNTER — Other Ambulatory Visit: Payer: Self-pay

## 2021-09-18 ENCOUNTER — Other Ambulatory Visit (HOSPITAL_COMMUNITY): Payer: Self-pay

## 2021-09-18 ENCOUNTER — Encounter: Payer: Self-pay | Admitting: Hematology and Oncology

## 2021-09-18 ENCOUNTER — Inpatient Hospital Stay: Payer: 59

## 2021-09-18 DIAGNOSIS — C50112 Malignant neoplasm of central portion of left female breast: Secondary | ICD-10-CM | POA: Insufficient documentation

## 2021-09-18 DIAGNOSIS — Z170 Estrogen receptor positive status [ER+]: Secondary | ICD-10-CM | POA: Diagnosis not present

## 2021-09-18 DIAGNOSIS — C50811 Malignant neoplasm of overlapping sites of right female breast: Secondary | ICD-10-CM | POA: Insufficient documentation

## 2021-09-18 DIAGNOSIS — Z5112 Encounter for antineoplastic immunotherapy: Secondary | ICD-10-CM | POA: Insufficient documentation

## 2021-09-18 DIAGNOSIS — C50919 Malignant neoplasm of unspecified site of unspecified female breast: Secondary | ICD-10-CM | POA: Diagnosis not present

## 2021-09-18 DIAGNOSIS — Z79899 Other long term (current) drug therapy: Secondary | ICD-10-CM | POA: Diagnosis not present

## 2021-09-18 LAB — CBC WITH DIFFERENTIAL (CANCER CENTER ONLY)
Abs Immature Granulocytes: 0.01 10*3/uL (ref 0.00–0.07)
Basophils Absolute: 0 10*3/uL (ref 0.0–0.1)
Basophils Relative: 1 %
Eosinophils Absolute: 0.1 10*3/uL (ref 0.0–0.5)
Eosinophils Relative: 2 %
HCT: 39.2 % (ref 36.0–46.0)
Hemoglobin: 13.2 g/dL (ref 12.0–15.0)
Immature Granulocytes: 0 %
Lymphocytes Relative: 26 %
Lymphs Abs: 1.7 10*3/uL (ref 0.7–4.0)
MCH: 27.8 pg (ref 26.0–34.0)
MCHC: 33.7 g/dL (ref 30.0–36.0)
MCV: 82.5 fL (ref 80.0–100.0)
Monocytes Absolute: 0.5 10*3/uL (ref 0.1–1.0)
Monocytes Relative: 8 %
Neutro Abs: 4.1 10*3/uL (ref 1.7–7.7)
Neutrophils Relative %: 63 %
Platelet Count: 315 10*3/uL (ref 150–400)
RBC: 4.75 MIL/uL (ref 3.87–5.11)
RDW: 12.6 % (ref 11.5–15.5)
WBC Count: 6.4 10*3/uL (ref 4.0–10.5)
nRBC: 0 % (ref 0.0–0.2)

## 2021-09-18 LAB — CMP (CANCER CENTER ONLY)
ALT: 14 U/L (ref 0–44)
AST: 20 U/L (ref 15–41)
Albumin: 4.8 g/dL (ref 3.5–5.0)
Alkaline Phosphatase: 91 U/L (ref 38–126)
Anion gap: 8 (ref 5–15)
BUN: 17 mg/dL (ref 6–20)
CO2: 27 mmol/L (ref 22–32)
Calcium: 10 mg/dL (ref 8.9–10.3)
Chloride: 103 mmol/L (ref 98–111)
Creatinine: 1.17 mg/dL — ABNORMAL HIGH (ref 0.44–1.00)
GFR, Estimated: >60 mL/min (ref >60)
Glucose, Bld: 91 mg/dL (ref 70–99)
Potassium: 4.3 mmol/L (ref 3.5–5.1)
Sodium: 138 mmol/L (ref 135–145)
Total Bilirubin: 0.4 mg/dL (ref 0.3–1.2)
Total Protein: 8.5 g/dL — ABNORMAL HIGH (ref 6.5–8.1)

## 2021-09-18 MED ORDER — ABEMACICLIB 100 MG PO TABS
100.0000 mg | ORAL_TABLET | Freq: Two times a day (BID) | ORAL | 0 refills | Status: DC
  Filled 2021-09-18: qty 56, 28d supply, fill #0

## 2021-09-18 MED ORDER — GOSERELIN ACETATE 3.6 MG ~~LOC~~ IMPL
3.6000 mg | DRUG_IMPLANT | Freq: Once | SUBCUTANEOUS | Status: AC
  Administered 2021-09-18: 3.6 mg via SUBCUTANEOUS
  Filled 2021-09-18: qty 3.6

## 2021-09-18 NOTE — Assessment & Plan Note (Signed)
03/14/2021:Patient palpated a lump in the retroareolar right breast with nipple retraction. Mammogram revealed right breast mass 2.6 cm and a single right axillary lymph node measuring 1.2 cm. (PET/CT negative) breast MRI revealed 4.2 cm right retroareolar mass with non-mass enhancement total measuring 14 cm, intraparenchymal lymph node 1.1 cm. 2 abnormal right axillary lymph nodes, biopsy revealed grade 2 IDC with DCIS ER 95%, PR 95%, Ki-67 20% focally 50%, HER2 negative, lymph node positive  PET CT scan: Negative for distant metastatic disease MammaPrint: High risk  Treatment plan: 1.Bilateral mastectomies 06/06/2021: [facility]: Left mastectomy: Grade 2 IDC 0.3 cm, margins negative Right mastectomy: Grade 3 IDC with DCIS 7.5 cm, margins negative, 4/4 lymph nodes positive ER 95%, PR 95%, HER2 1+, Ki-67 20% focally 50% 2.adjuvant chemotherapywith dose dense Adriamycin and Cytoxan x4 followed by Taxol x12 (patient refused chemo and radiation) 3.Adjuvant antiestrogen therapy with AI plus ovarian suppression plus or minus abemaciclib versus tamoxifen with or without abemaciclib ------------------------------------------------------------------------------------------------------------------------------------ Current treatment: Zoladex with letrozole and abemaciclib started 08/22/2022  Abemaciclib toxicities:  Return to clinic in 2 weeks to meet with Gilford Rile or clinical pharmacy specialist for follow-up

## 2021-09-19 ENCOUNTER — Ambulatory Visit: Payer: 59 | Admitting: Pharmacist

## 2021-09-19 ENCOUNTER — Other Ambulatory Visit: Payer: 59

## 2021-09-19 ENCOUNTER — Other Ambulatory Visit (HOSPITAL_COMMUNITY): Payer: Self-pay

## 2021-09-19 ENCOUNTER — Telehealth: Payer: Self-pay

## 2021-09-19 NOTE — Telephone Encounter (Signed)
Obtained a copay card for patient's Verzenio. Patient is allowed $25,000/year an dup to that point patient's copay should be $0.  Bin: [id number] PCN: [id number] Group: [id number] ID: [id number] Expires 09/01/22  [facility] [place] Phone [phone] Fax [phone] 09/19/2021 4:25 PM

## 2021-09-26 ENCOUNTER — Ambulatory Visit: Payer: 59 | Admitting: Pharmacist

## 2021-09-26 ENCOUNTER — Other Ambulatory Visit: Payer: 59

## 2021-10-15 ENCOUNTER — Other Ambulatory Visit (HOSPITAL_COMMUNITY): Payer: Self-pay

## 2021-10-15 ENCOUNTER — Other Ambulatory Visit: Payer: Self-pay | Admitting: Hematology and Oncology

## 2021-10-15 DIAGNOSIS — C50919 Malignant neoplasm of unspecified site of unspecified female breast: Secondary | ICD-10-CM

## 2021-10-15 NOTE — Telephone Encounter (Signed)
Shannon Hale, it looks like you see this pt on the 15th.  Just and FYI when you see her to refill her medication.

## 2021-10-16 ENCOUNTER — Other Ambulatory Visit (HOSPITAL_COMMUNITY): Payer: Self-pay

## 2021-10-16 ENCOUNTER — Encounter: Payer: Self-pay | Admitting: Hematology and Oncology

## 2021-10-17 ENCOUNTER — Inpatient Hospital Stay: Payer: 59

## 2021-10-17 ENCOUNTER — Inpatient Hospital Stay: Payer: 59 | Attending: Hematology and Oncology

## 2021-10-17 ENCOUNTER — Inpatient Hospital Stay: Payer: 59 | Admitting: Pharmacist

## 2021-10-17 ENCOUNTER — Other Ambulatory Visit (HOSPITAL_COMMUNITY): Payer: Self-pay

## 2021-10-17 ENCOUNTER — Other Ambulatory Visit: Payer: Self-pay

## 2021-10-17 VITALS — BP 130/71 | HR 79 | Temp 97.7°F | Resp 16 | Ht 69.0 in | Wt 205.8 lb

## 2021-10-17 DIAGNOSIS — C50811 Malignant neoplasm of overlapping sites of right female breast: Secondary | ICD-10-CM | POA: Insufficient documentation

## 2021-10-17 DIAGNOSIS — Z5111 Encounter for antineoplastic chemotherapy: Secondary | ICD-10-CM | POA: Insufficient documentation

## 2021-10-17 DIAGNOSIS — Z79899 Other long term (current) drug therapy: Secondary | ICD-10-CM | POA: Insufficient documentation

## 2021-10-17 DIAGNOSIS — C50919 Malignant neoplasm of unspecified site of unspecified female breast: Secondary | ICD-10-CM

## 2021-10-17 DIAGNOSIS — C50112 Malignant neoplasm of central portion of left female breast: Secondary | ICD-10-CM | POA: Diagnosis not present

## 2021-10-17 DIAGNOSIS — Z5112 Encounter for antineoplastic immunotherapy: Secondary | ICD-10-CM | POA: Diagnosis present

## 2021-10-17 LAB — CMP (CANCER CENTER ONLY)
ALT: 12 U/L (ref 0–44)
AST: 14 U/L — ABNORMAL LOW (ref 15–41)
Albumin: 4.6 g/dL (ref 3.5–5.0)
Alkaline Phosphatase: 74 U/L (ref 38–126)
Anion gap: 6 (ref 5–15)
BUN: 15 mg/dL (ref 6–20)
CO2: 29 mmol/L (ref 22–32)
Calcium: 10.1 mg/dL (ref 8.9–10.3)
Chloride: 104 mmol/L (ref 98–111)
Creatinine: 1.03 mg/dL — ABNORMAL HIGH (ref 0.44–1.00)
GFR, Estimated: >60 mL/min (ref >60)
Glucose, Bld: 96 mg/dL (ref 70–99)
Potassium: 4.1 mmol/L (ref 3.5–5.1)
Sodium: 139 mmol/L (ref 135–145)
Total Bilirubin: 0.3 mg/dL (ref 0.3–1.2)
Total Protein: 7.9 g/dL (ref 6.5–8.1)

## 2021-10-17 LAB — CBC WITH DIFFERENTIAL (CANCER CENTER ONLY)
Abs Immature Granulocytes: 0.01 10*3/uL (ref 0.00–0.07)
Basophils Absolute: 0 10*3/uL (ref 0.0–0.1)
Basophils Relative: 1 %
Eosinophils Absolute: 0.1 10*3/uL (ref 0.0–0.5)
Eosinophils Relative: 2 %
HCT: 37.7 % (ref 36.0–46.0)
Hemoglobin: 12.6 g/dL (ref 12.0–15.0)
Immature Granulocytes: 0 %
Lymphocytes Relative: 28 %
Lymphs Abs: 1.9 10*3/uL (ref 0.7–4.0)
MCH: 27.2 pg (ref 26.0–34.0)
MCHC: 33.4 g/dL (ref 30.0–36.0)
MCV: 81.3 fL (ref 80.0–100.0)
Monocytes Absolute: 0.4 10*3/uL (ref 0.1–1.0)
Monocytes Relative: 6 %
Neutro Abs: 4.5 10*3/uL (ref 1.7–7.7)
Neutrophils Relative %: 63 %
Platelet Count: 299 10*3/uL (ref 150–400)
RBC: 4.64 MIL/uL (ref 3.87–5.11)
RDW: 13.2 % (ref 11.5–15.5)
WBC Count: 6.9 10*3/uL (ref 4.0–10.5)
nRBC: 0 % (ref 0.0–0.2)

## 2021-10-17 MED ORDER — ABEMACICLIB 150 MG PO TABS
150.0000 mg | ORAL_TABLET | Freq: Two times a day (BID) | ORAL | 0 refills | Status: DC
  Filled 2021-10-17: qty 56, 28d supply, fill #0

## 2021-10-17 MED ORDER — GOSERELIN ACETATE 3.6 MG ~~LOC~~ IMPL
3.6000 mg | DRUG_IMPLANT | Freq: Once | SUBCUTANEOUS | Status: AC
  Administered 2021-10-17: 3.6 mg via SUBCUTANEOUS
  Filled 2021-10-17: qty 3.6

## 2021-10-17 NOTE — Progress Notes (Signed)
[place]       Telephone: [phone]?Fax: [phone]   Oncology Clinical Pharmacist Practitioner Initial Assessment  Shannon Hale is a 41 y.o. female with a diagnosis of breast cancer. They were contacted today via in-person visit.  Indication/Regimen Abemaciclib (Verzenio) is being used appropriately for treatment of breast cancer in the adjuvant setting by Dr. Nicholas Lose.    Wt Readings from Last 1 Encounters:  10/17/21 205 lb 12.8 oz (93.4 kg)    Estimated body surface area is 2.13 meters squared as calculated from the following:   Height as of this encounter: 5\' 9"  (1.753 m).   Weight as of this encounter: 205 lb 12.8 oz (93.4 kg).  The dosing regimen is 100 mg by mouth every 12 hours on days 1 to 28 of a 28-day cycle. This is being given  in combination with letrozole started 08/22/21 and goserelin started 08/22/21 . Abemaciclib is planned to continue until  2 years in the adjuvant setting per the monarchE trial data.  .  Shannon Hale was seen today by clinical pharmacy after being referred by Dr. Lindi Adie.  She started abemaciclib at 50 mg every 12 hours on January 2.  She then increased to 100 mg every 12 hours about a month later.  She states that she is tolerating this dose well and is not reporting of any side effects at this time.  Shannon Hale has about 2 weeks left of the 100 mg dose tablets and she is comfortable increasing to the 150 mg every 12 hour dose once the 100 mg dose tablets have been exhausted.  A new prescription has been sent to the [facility] with a start date of March 1.  We reviewed possible side effects of abemaciclib which include but are not limited to diarrhea, neutropenia, pneumonitis, hepatotoxicity, VTE, fatigue, alopecia, nausea, and vomiting.  Shannon Hale has ondansetron at home that she takes as needed for migraines.  We explained that we could send her prochlorperazine if needed at a later time.  She also knows to take  loperamide as needed for diarrhea but as above has not needed it yet.  We reviewed that she should not take grapefruit products while on abemaciclib, and we reviewed some items that she could do to try to reduce her risk of breast cancer recurrence such as exercise, eating properly, and limiting alcohol consumption.  Since she started letrozole, we have ordered a baseline DEXA scan which she prefers to get at [facility].  She will continue to get her goserelin at [facility] every 4 weeks and we will see her again with labs at that time.  Once she is tolerating the 150 mg dose of abemaciclib, she will see Dr. Lindi Adie again.  Shannon Hale asked great questions and was very engaged during our discussion.    Dose Modifications Started at 50 mg every 12 hours on 09/03/21 and then increased to 100 mg every 12 hours (current dose) for cycle 2. She has about 2 weeks left per her estimation and she will increase to 150 mg every 12 hours around 10/31/21.  A new prescription has been sent to [place].  Access Assessment Shannon Hale will be receiving abemaciclib through [facility] Concerns: none Start date if known: 09/03/21  Allergies No Known Allergies  Vitals Vitals with BMI 10/17/2021 09/18/2021 09/10/2021  Height 5\' 9"  5\' 9"  -  Weight 205 lbs 13 oz 204 lbs 207 lbs  BMI 22.63 33.54 -  Systolic 562 563 -  Diastolic 71 76 -  Pulse 79 80 -     Laboratory Data CBC EXTENDED Latest Ref Rng & Units 10/17/2021 09/18/2021 03/20/2021  WBC 4.0 - 10.5 K/uL 6.9 6.4 5.9  RBC 3.87 - 5.11 MIL/uL 4.64 4.75 4.50  HGB 12.0 - 15.0 g/dL 12.6 13.2 12.9  HCT 36.0 - 46.0 % 37.7 39.2 37.6  PLT 150 - 400 K/uL 299 315 283  NEUTROABS 1.7 - 7.7 K/uL 4.5 4.1 3.4  LYMPHSABS 0.7 - 4.0 K/uL 1.9 1.7 1.8    CMP Latest Ref Rng & Units 10/17/2021 09/18/2021 03/20/2021  Glucose 70 - 99 mg/dL 96 91 106(H)  BUN 6 - 20 mg/dL 15 17 8   Creatinine 0.44 - 1.00 mg/dL 1.03(H) 1.17(H) 0.75  Sodium 135 -  145 mmol/L 139 138 141  Potassium 3.5 - 5.1 mmol/L 4.1 4.3 4.0  Chloride 98 - 111 mmol/L 104 103 107  CO2 22 - 32 mmol/L 29 27 26   Calcium 8.9 - 10.3 mg/dL 10.1 10.0 10.0  Total Protein 6.5 - 8.1 g/dL 7.9 8.5(H) 7.6  Total Bilirubin 0.3 - 1.2 mg/dL 0.3 0.4 0.5  Alkaline Phos 38 - 126 U/L 74 91 61  AST 15 - 41 U/L 14(L) 20 17  ALT 0 - 44 U/L 12 14 15    No results found for: MG   Contraindications Contraindications were reviewed? Yes Contraindications to therapy were identified? No   Safety Precautions The following safety precautions for the use of abemaciclib were reviewed:  Diarrhea: we reviewed that diarrhea is common with abemaciclib and confirmed that she does have loperamide (Imodium) at home.  We reviewed how to take this medication PRN and gave her information on abemaciclib Neutropenia: we discussed the importance of having a thermometer and what the Centers for Disease Control and Prevention (CDC) considers a fever which is 100.75F (38C) or higher.  Gave patient 24/7 triage line to call if any fevers or symptoms ILD/Pneumonitis: we reviewed potential symptoms including cough, shortness, and fatigue. Hepatotoxicity: reviewed to contact clinic for RUQ pain that will not subside, yellowing of eyes/skin Venous thromboembolism (VTE): reviewed signs of deep vein thrombosis (DVT) such as leg swelling, redness, pain, or tenderness and signs of pulmonary embolism (PE) such as shortness of breath, rapid or irregular heartbeat, cough, chest pain, or lightheadedness Reviewed to take the medication every 12 hours (with food sometimes can be easier on the stomach) and to take it at the same time every day. Discussed proper storage and handling of abemaciclib  Medication Reconciliation Current Outpatient Medications  Medication Sig Dispense Refill   [START ON 10/31/2021] abemaciclib (VERZENIO) 150 MG tablet Take 1 tablet (150 mg total) by mouth 2 (two) times daily. Start once 100 mg tablets  have been exhausted 60 tablet 0   ascorbic acid (VITAMIN C) 1000 MG tablet Take by mouth.     Cholecalciferol (VITAMIN D-3) 125 MCG (5000 UT) TABS Take 125 mcg by mouth.     l-methylfolate-B6-B12 (METANX) 3-35-2 MG TABS tablet Take 1 tablet by mouth daily.     letrozole (FEMARA) 2.5 MG tablet Take 1 tablet (2.5 mg total) by mouth daily. 90 tablet 3   Turmeric 450 MG CAPS Take by mouth.     vitamin E 180 MG (400 UNITS) capsule Take 400 Units by mouth daily.     zinc gluconate 50 MG tablet Take by mouth.     abemaciclib (VERZENIO) 100 MG tablet Take 1 tablet (  100 mg total) by mouth 2 (two) times daily. Swallow tablets whole. Do not chew, crush, or split tablets before swallowing. 60 tablet 0   Acetylcysteine (N-ACETYL-L-CYSTEINE) 600 MG CAPS Take 600 mg by mouth. (Patient not taking: Reported on 10/17/2021)     AMBULATORY NON FORMULARY MEDICATION Eg Cg Boost supplement (Patient not taking: Reported on 10/17/2021)     APPLE CIDER VINEGAR PO Take 450 mg by mouth. (Patient not taking: Reported on 10/17/2021)     methocarbamol (ROBAXIN) 500 MG tablet Take 1 tablet (500 mg total) by mouth 2 (two) times daily as needed for muscle spasms. (Patient not taking: Reported on 10/17/2021) 14 tablet 0   NON FORMULARY  (Patient not taking: Reported on 10/17/2021)     ondansetron (ZOFRAN ODT) 4 MG disintegrating tablet Take 1 tablet (4 mg total) by mouth every 8 (eight) hours as needed for nausea or vomiting. (Patient not taking: Reported on 10/17/2021) 20 tablet 0   ondansetron (ZOFRAN) 4 MG tablet Take 1 tablet (4 mg total) by mouth every 8 (eight) hours as needed for nausea or vomiting. (Patient not taking: Reported on 10/17/2021) 30 tablet 2   Rimegepant Sulfate (NURTEC) 75 MG TBDP Use one tablet daily as needed for migraine (Patient not taking: Reported on 10/17/2021) 5 tablet 1   No current facility-administered medications for this visit.    Medication reconciliation is based on the patient's most recent  medication list in the electronic medical record (EMR) including herbal products and OTC medications.   The patient's medication list was reviewed today with the patient? Yes   Drug-drug interactions (DDIs) DDIs were evaluated? Yes Significant DDIs identified? No   Drug-Food Interactions Drug-food interactions were evaluated? Yes Drug-food interactions identified?  Avoid grapefruit products  Follow-up Plan  Goserelin today and every 4 weeks (visits scheduled) Labs and pharmacy clinic visit when receiving goserelin (will send scheduling request) Increase to abemaciclib 150 mg every 12 hours when 100 mg tablets have been exhausted (~ 10/31/21)  Shannon Hale participated in the discussion, expressed understanding, and voiced agreement with the above plan. All questions were answered to her satisfaction. The patient was advised to contact the clinic at [phone] with any questions or concerns prior to her return visit.   I spent 30 minutes assessing the patient.  Raina Mina, RPH-CPP, 10/17/2021 3:54 PM  **Disclaimer: This note was dictated with voice recognition software. Similar sounding words can inadvertently be transcribed and this note may contain transcription errors which may not have been corrected upon publication of note.**

## 2021-10-23 ENCOUNTER — Other Ambulatory Visit (HOSPITAL_COMMUNITY): Payer: Self-pay

## 2021-10-25 ENCOUNTER — Encounter (HOSPITAL_COMMUNITY): Payer: Self-pay

## 2021-10-26 ENCOUNTER — Telehealth: Payer: Self-pay

## 2021-10-26 NOTE — Telephone Encounter (Signed)
PA approved.   The request has been approved. The authorization is effective for a maximum of 12 fills from 10/26/2021 to 10/25/2022, as long as the member is enrolled in their current health plan. The request was approved with a quantity restriction. This has been approved for a max daily dosage of 0. A written notification letter will follow with additional details.

## 2021-10-26 NOTE — Telephone Encounter (Signed)
PA initiated via Covermymeds; KEY: [id number]. Awaiting determination.

## 2021-11-14 ENCOUNTER — Ambulatory Visit: Payer: 59

## 2021-11-14 ENCOUNTER — Ambulatory Visit: Payer: 59 | Admitting: Pharmacist

## 2021-11-14 ENCOUNTER — Other Ambulatory Visit: Payer: 59

## 2021-11-15 ENCOUNTER — Other Ambulatory Visit (HOSPITAL_COMMUNITY): Payer: Self-pay

## 2021-11-20 ENCOUNTER — Other Ambulatory Visit: Payer: Self-pay | Admitting: Pharmacist

## 2021-11-20 ENCOUNTER — Other Ambulatory Visit (HOSPITAL_COMMUNITY): Payer: Self-pay

## 2021-11-20 ENCOUNTER — Inpatient Hospital Stay: Payer: 59 | Attending: Hematology and Oncology

## 2021-11-20 ENCOUNTER — Inpatient Hospital Stay: Payer: 59 | Admitting: Pharmacist

## 2021-11-20 ENCOUNTER — Encounter: Payer: Self-pay | Admitting: Pharmacist

## 2021-11-20 ENCOUNTER — Inpatient Hospital Stay: Payer: 59

## 2021-11-20 ENCOUNTER — Other Ambulatory Visit: Payer: Self-pay

## 2021-11-20 VITALS — BP 125/74 | HR 87 | Temp 98.1°F | Resp 16 | Ht 69.0 in | Wt 205.8 lb

## 2021-11-20 VITALS — BP 123/76 | HR 72 | Temp 98.5°F

## 2021-11-20 DIAGNOSIS — C50811 Malignant neoplasm of overlapping sites of right female breast: Secondary | ICD-10-CM

## 2021-11-20 DIAGNOSIS — Z5111 Encounter for antineoplastic chemotherapy: Secondary | ICD-10-CM | POA: Insufficient documentation

## 2021-11-20 DIAGNOSIS — C50112 Malignant neoplasm of central portion of left female breast: Secondary | ICD-10-CM | POA: Insufficient documentation

## 2021-11-20 DIAGNOSIS — C50919 Malignant neoplasm of unspecified site of unspecified female breast: Secondary | ICD-10-CM

## 2021-11-20 DIAGNOSIS — Z170 Estrogen receptor positive status [ER+]: Secondary | ICD-10-CM | POA: Insufficient documentation

## 2021-11-20 LAB — CBC WITH DIFFERENTIAL (CANCER CENTER ONLY)
Abs Immature Granulocytes: 0.02 10*3/uL (ref 0.00–0.07)
Basophils Absolute: 0.1 10*3/uL (ref 0.0–0.1)
Basophils Relative: 1 %
Eosinophils Absolute: 0.1 10*3/uL (ref 0.0–0.5)
Eosinophils Relative: 2 %
HCT: 37 % (ref 36.0–46.0)
Hemoglobin: 12.6 g/dL (ref 12.0–15.0)
Immature Granulocytes: 0 %
Lymphocytes Relative: 33 %
Lymphs Abs: 1.6 10*3/uL (ref 0.7–4.0)
MCH: 28.4 pg (ref 26.0–34.0)
MCHC: 34.1 g/dL (ref 30.0–36.0)
MCV: 83.5 fL (ref 80.0–100.0)
Monocytes Absolute: 0.4 10*3/uL (ref 0.1–1.0)
Monocytes Relative: 7 %
Neutro Abs: 2.8 10*3/uL (ref 1.7–7.7)
Neutrophils Relative %: 57 %
Platelet Count: 293 10*3/uL (ref 150–400)
RBC: 4.43 MIL/uL (ref 3.87–5.11)
RDW: 13.8 % (ref 11.5–15.5)
WBC Count: 4.9 10*3/uL (ref 4.0–10.5)
nRBC: 0 % (ref 0.0–0.2)

## 2021-11-20 LAB — CMP (CANCER CENTER ONLY)
ALT: 14 U/L (ref 0–44)
AST: 18 U/L (ref 15–41)
Albumin: 4.7 g/dL (ref 3.5–5.0)
Alkaline Phosphatase: 75 U/L (ref 38–126)
Anion gap: 7 (ref 5–15)
BUN: 8 mg/dL (ref 6–20)
CO2: 30 mmol/L (ref 22–32)
Calcium: 10.4 mg/dL — ABNORMAL HIGH (ref 8.9–10.3)
Chloride: 103 mmol/L (ref 98–111)
Creatinine: 1.02 mg/dL — ABNORMAL HIGH (ref 0.44–1.00)
GFR, Estimated: >60 mL/min (ref >60)
Glucose, Bld: 93 mg/dL (ref 70–99)
Potassium: 4.3 mmol/L (ref 3.5–5.1)
Sodium: 140 mmol/L (ref 135–145)
Total Bilirubin: 0.4 mg/dL (ref 0.3–1.2)
Total Protein: 8.3 g/dL — ABNORMAL HIGH (ref 6.5–8.1)

## 2021-11-20 MED ORDER — GOSERELIN ACETATE 3.6 MG ~~LOC~~ IMPL
3.6000 mg | DRUG_IMPLANT | Freq: Once | SUBCUTANEOUS | Status: AC
  Administered 2021-11-20: 3.6 mg via SUBCUTANEOUS
  Filled 2021-11-20: qty 3.6

## 2021-11-20 MED ORDER — ABEMACICLIB 150 MG PO TABS
150.0000 mg | ORAL_TABLET | Freq: Two times a day (BID) | ORAL | 3 refills | Status: DC
Start: 2021-12-09 — End: 2022-03-21
  Filled 2021-11-20 – 2021-11-27 (×2): qty 56, 28d supply, fill #0
  Filled 2021-12-27: qty 56, 28d supply, fill #1
  Filled 2022-01-23: qty 56, 28d supply, fill #2
  Filled 2022-02-25: qty 56, 28d supply, fill #3

## 2021-11-20 NOTE — Progress Notes (Signed)
[place]  ?     Telephone: [phone]?Fax: [phone]  ? ?Oncology Clinical Pharmacist Practitioner Progress Note ? ?Shannon Hale was contacted via in-person to discuss her chemotherapy regimen for abemaciclib which they receive under the care of Dr. Nicholas Lose. ? ?Current treatment regimen and start date ?Abemaciclib (09/03/21) ?Goserelin (08/22/21) ?Letrozole (08/02/21) ? ?Interval History ?She continues on abemaciclib 150 mg by mouth every 12 hours on days 1 to 28 of a 28-day cycle. This is being given  in combination with letrozole and goserelin . Therapy is planned to continue until  2 years in the adjuvant setting per the monarchE trial data .  ? ?Response to Therapy ?Shannon Hale is doing well.  She was last seen by clinical pharmacy on February 21, and last saw Dr. Lindi Adie on January 17.  She states that she started the abemaciclib 150 mg every 12 hour dose around March 12.  She states this was delayed because she still had some of the 100 mg tablets left.  During our discussion, she states that she missed about 7 tablets of the 100 mg dose due to not feeling well.  She also states that there were some times where she did not take one of the doses.  We reviewed some reminder options and she did state that she has now added alarms on her phone to remind her to take her medications and she has not missed any since.  She reports about 4 episodes of diarrhea, all starting after she increased her dose of abemaciclib 250 mg every 12 hours.  She states the diarrhea has been manageable and she has not needed to take any over-the-counter loperamide.  We again reviewed how to take this medication and she verbalized understanding of the plan.  Shannon Hale also reports 1 episode of vomiting.  She had taken ondansetron prior to this episode as directed.  She reports no other episodes of vomiting and she has not needed to take the antinausea medications.  She is not taking her abemaciclib with  food, because she does intermittent fasting.  With the schedule she usually skips breakfast, and then depending on if she is hungry or not, she will eat lunch or an afternoon snack.  She then eats dinner which consists of vegetables and then a meat such as chicken or [place].  She reports not eating much fish.  She states that her fatigue has been manageable and stable.  She is reporting no other side effects at this time. ? ?Shannon Hale has been summoned for jury duty which she believes will be around May 20.  She is tentatively 6 planned for goserelin injection on May 16.  She inquired about having a letter from Dr. Lindi Adie stating that she is on an every 4-week injection for the prevention of breast cancer recurrence.  Clinical pharmacy will notify nursing about a potential letter that can be sent via [place].  Shannon Hale was in agreement to this plan. ? ?Shannon Hale serum creatinine continues to be slightly elevated but lower than her baseline value.  We again reviewed the importance of drinking plenty of fluids.  Her calcium was also slightly above the upper limit of normal today.  We reviewed her medications and recommended taking her vitamin D supplement every other day for the next month. DEXA ordered. Will check to see when it will be scheduled. Labs, vitals, treatment parameters, and manufacturer guidelines assessing toxicity were reviewed with Shannon Hale today. Based  on these values, patient is in agreement to continue abemaciclib therapy at this time. ? ?Allergies ?No Known Allergies ? ?Vitals ?Vitals with BMI 11/20/2021 11/20/2021 10/17/2021  ?Height - '5\' 9"'$  '5\' 9"'$   ?Weight - 205 lbs 13 oz 205 lbs 13 oz  ?BMI - 30.38 30.38  ?Systolic 803 212 248  ?Diastolic 76 74 71  ?Pulse 72 87 79  ?  ? ?Laboratory Data ?CBC EXTENDED Latest Ref Rng & Units 11/20/2021 10/17/2021 09/18/2021  ?WBC 4.0 - 10.5 K/uL 4.9 6.9 6.4  ?RBC 3.87 - 5.11 MIL/uL 4.43 4.64 4.75  ?HGB 12.0 - 15.0 g/dL 12.6 12.6 13.2  ?HCT 36.0 - 46.0 %  37.0 37.7 39.2  ?PLT 150 - 400 K/uL 293 299 315  ?NEUTROABS 1.7 - 7.7 K/uL 2.8 4.5 4.1  ?LYMPHSABS 0.7 - 4.0 K/uL 1.6 1.9 1.7  ?  ?CMP Latest Ref Rng & Units 11/20/2021 10/17/2021 09/18/2021  ?Glucose 70 - 99 mg/dL 93 96 91  ?BUN 6 - 20 mg/dL '8 15 17  '$ ?Creatinine 0.44 - 1.00 mg/dL 1.02(H) 1.03(H) 1.17(H)  ?Sodium 135 - 145 mmol/L 140 139 138  ?Potassium 3.5 - 5.1 mmol/L 4.3 4.1 4.3  ?Chloride 98 - 111 mmol/L 103 104 103  ?CO2 22 - 32 mmol/L '30 29 27  '$ ?Calcium 8.9 - 10.3 mg/dL 10.4(H) 10.1 10.0  ?Total Protein 6.5 - 8.1 g/dL 8.3(H) 7.9 8.5(H)  ?Total Bilirubin 0.3 - 1.2 mg/dL 0.4 0.3 0.4  ?Alkaline Phos 38 - 126 U/L 75 74 91  ?AST 15 - 41 U/L 18 14(L) 20  ?ALT 0 - 44 U/L '14 12 14  '$ ?  ?No results found for: MG ? ?Adverse Effects Assessment ?Diarrhea: controlled with loperamide ?Nausea: controlled with ondansetron. Removed ODT prescription. Has tablets ?Elevated serum creatinine: stable, slightly decreased from baseline value ?Elevated calcium: recommended every other day vitamin D for 1 month ? ?Adherence Assessment ?Shannon Hale reports missing 7 doses over the past 4 weeks.   ?Reason for missed dose: not feeling well or forgot ?Patient was re-educated on importance of adherence.  ? ?Access Assessment ?Shannon Hale is currently receiving her abemaciclib through [facility]  ?Insurance concerns:  none ? ?Medication Reconciliation ?The patient's medication list was reviewed today with the patient? Yes ?New medications or herbal supplements have recently been started? No  ?Any medications have been discontinued? No  ?The medication list was updated and reconciled based on the patient's most recent medication list in the electronic medical record (EMR) including herbal products and OTC medications.  ? ?Medications ?Current Outpatient Medications  ?Medication Sig Dispense Refill  ? abemaciclib (VERZENIO) 150 MG tablet Take 1 tablet (150 mg total) by mouth 2 (two) times daily. Start once 100 mg  tablets have been exhausted 60 tablet 0  ? Acetylcysteine (N-ACETYL-L-CYSTEINE) 600 MG CAPS Take 600 mg by mouth. (Patient not taking: Reported on 10/17/2021)    ? AMBULATORY NON FORMULARY MEDICATION Eg Cg Boost supplement (Patient not taking: Reported on 10/17/2021)    ? APPLE CIDER VINEGAR PO Take 450 mg by mouth. (Patient not taking: Reported on 10/17/2021)    ? ascorbic acid (VITAMIN C) 1000 MG tablet Take by mouth.    ? Cholecalciferol (VITAMIN D-3) 125 MCG (5000 UT) TABS Take 125 mcg by mouth.    ? l-methylfolate-B6-B12 (METANX) 3-35-2 MG TABS tablet Take 1 tablet by mouth daily.    ? letrozole (FEMARA) 2.5 MG tablet Take 1 tablet (2.5 mg total) by mouth daily. 90 tablet  3  ? methocarbamol (ROBAXIN) 500 MG tablet Take 1 tablet (500 mg total) by mouth 2 (two) times daily as needed for muscle spasms. (Patient not taking: Reported on 10/17/2021) 14 tablet 0  ? NON FORMULARY  (Patient not taking: Reported on 10/17/2021)    ? ondansetron (ZOFRAN) 4 MG tablet Take 1 tablet (4 mg total) by mouth every 8 (eight) hours as needed for nausea or vomiting. (Patient not taking: Reported on 10/17/2021) 30 tablet 2  ? Rimegepant Sulfate (NURTEC) 75 MG TBDP Use one tablet daily as needed for migraine (Patient not taking: Reported on 10/17/2021) 5 tablet 1  ? Turmeric 450 MG CAPS Take by mouth.    ? vitamin E 180 MG (400 UNITS) capsule Take 400 Units by mouth daily.    ? zinc gluconate 50 MG tablet Take by mouth.    ? ?No current facility-administered medications for this visit.  ? ? ?Drug-Drug Interactions (DDIs) ?DDIs were evaluated? Yes ?Significant DDIs? No  ?The patient was instructed to speak with their health care provider and/or the oral chemotherapy pharmacist before starting any new drug, including prescription or over the counter, natural / herbal products, or vitamins. ? ?Supportive Care ?Diarrhea: we reviewed that diarrhea is common with abemaciclib and confirmed that she does have loperamide (Imodium) at home.  We  reviewed how to take this medication PRN ?Neutropenia: we discussed the importance of having a thermometer and what the Centers for Disease Control and Prevention (CDC) considers a fever which is 100.4?F (38?C) or

## 2021-11-27 ENCOUNTER — Other Ambulatory Visit (HOSPITAL_COMMUNITY): Payer: Self-pay

## 2021-12-04 ENCOUNTER — Other Ambulatory Visit (HOSPITAL_COMMUNITY): Payer: Self-pay

## 2021-12-12 ENCOUNTER — Other Ambulatory Visit: Payer: 59

## 2021-12-12 ENCOUNTER — Ambulatory Visit: Payer: 59

## 2021-12-12 ENCOUNTER — Ambulatory Visit: Payer: 59 | Admitting: Pharmacist

## 2021-12-18 ENCOUNTER — Other Ambulatory Visit: Payer: Self-pay

## 2021-12-18 ENCOUNTER — Inpatient Hospital Stay: Payer: 59 | Admitting: Pharmacist

## 2021-12-18 ENCOUNTER — Inpatient Hospital Stay: Payer: 59

## 2021-12-18 ENCOUNTER — Inpatient Hospital Stay: Payer: 59 | Attending: Hematology and Oncology

## 2021-12-18 VITALS — BP 107/67 | HR 69 | Temp 97.9°F | Resp 18 | Ht 69.0 in | Wt 205.7 lb

## 2021-12-18 DIAGNOSIS — Z170 Estrogen receptor positive status [ER+]: Secondary | ICD-10-CM

## 2021-12-18 DIAGNOSIS — C50811 Malignant neoplasm of overlapping sites of right female breast: Secondary | ICD-10-CM | POA: Insufficient documentation

## 2021-12-18 DIAGNOSIS — Z5111 Encounter for antineoplastic chemotherapy: Secondary | ICD-10-CM | POA: Insufficient documentation

## 2021-12-18 DIAGNOSIS — Z79899 Other long term (current) drug therapy: Secondary | ICD-10-CM | POA: Insufficient documentation

## 2021-12-18 DIAGNOSIS — C50112 Malignant neoplasm of central portion of left female breast: Secondary | ICD-10-CM | POA: Diagnosis not present

## 2021-12-18 LAB — CBC WITH DIFFERENTIAL (CANCER CENTER ONLY)
Abs Immature Granulocytes: 0.02 10*3/uL (ref 0.00–0.07)
Basophils Absolute: 0 10*3/uL (ref 0.0–0.1)
Basophils Relative: 1 %
Eosinophils Absolute: 0.1 10*3/uL (ref 0.0–0.5)
Eosinophils Relative: 2 %
HCT: 33.7 % — ABNORMAL LOW (ref 36.0–46.0)
Hemoglobin: 11.7 g/dL — ABNORMAL LOW (ref 12.0–15.0)
Immature Granulocytes: 1 %
Lymphocytes Relative: 38 %
Lymphs Abs: 1.7 10*3/uL (ref 0.7–4.0)
MCH: 29.6 pg (ref 26.0–34.0)
MCHC: 34.7 g/dL (ref 30.0–36.0)
MCV: 85.3 fL (ref 80.0–100.0)
Monocytes Absolute: 0.3 10*3/uL (ref 0.1–1.0)
Monocytes Relative: 7 %
Neutro Abs: 2.3 10*3/uL (ref 1.7–7.7)
Neutrophils Relative %: 51 %
Platelet Count: 257 10*3/uL (ref 150–400)
RBC: 3.95 MIL/uL (ref 3.87–5.11)
RDW: 14.3 % (ref 11.5–15.5)
WBC Count: 4.4 10*3/uL (ref 4.0–10.5)
nRBC: 0 % (ref 0.0–0.2)

## 2021-12-18 LAB — CMP (CANCER CENTER ONLY)
ALT: 10 U/L (ref 0–44)
AST: 16 U/L (ref 15–41)
Albumin: 4.6 g/dL (ref 3.5–5.0)
Alkaline Phosphatase: 67 U/L (ref 38–126)
Anion gap: 7 (ref 5–15)
BUN: 7 mg/dL (ref 6–20)
CO2: 26 mmol/L (ref 22–32)
Calcium: 9.6 mg/dL (ref 8.9–10.3)
Chloride: 105 mmol/L (ref 98–111)
Creatinine: 0.98 mg/dL (ref 0.44–1.00)
GFR, Estimated: >60 mL/min (ref >60)
Glucose, Bld: 88 mg/dL (ref 70–99)
Potassium: 3.7 mmol/L (ref 3.5–5.1)
Sodium: 138 mmol/L (ref 135–145)
Total Bilirubin: 0.5 mg/dL (ref 0.3–1.2)
Total Protein: 7.8 g/dL (ref 6.5–8.1)

## 2021-12-18 MED ORDER — GOSERELIN ACETATE 3.6 MG ~~LOC~~ IMPL
3.6000 mg | DRUG_IMPLANT | Freq: Once | SUBCUTANEOUS | Status: AC
  Administered 2021-12-18: 3.6 mg via SUBCUTANEOUS
  Filled 2021-12-18: qty 3.6

## 2021-12-18 NOTE — Patient Instructions (Signed)
Goserelin injection ?What is this medication? ?GOSERELIN (GOE se rel in) is similar to a hormone found in the body. It lowers the amount of sex hormones that the body makes. Men will have lower testosterone levels and women will have lower estrogen levels while taking this medicine. In men, this medicine is used to treat prostate cancer; the injection is either given once per month or once every 12 weeks. A once per month injection (only) is used to treat women with endometriosis, dysfunctional uterine bleeding, or advanced breast cancer. ?This medicine may be used for other purposes; ask your health care provider or pharmacist if you have questions. ?COMMON BRAND NAME(S): Zoladex, Zoladex 59-Month ?What should I tell my care team before I take this medication? ?They need to know if you have any of these conditions: ?bone problems ?diabetes ?heart disease ?history of irregular heartbeat ?an unusual or allergic reaction to goserelin, other medicines, foods, dyes, or preservatives ?pregnant or trying to get pregnant ?breast-feeding ?How should I use this medication? ?This medicine is for injection under the skin. It is given by a health care professional in a hospital or clinic setting. ?Talk to your pediatrician regarding the use of this medicine in children. Special care may be needed. ?Overdosage: If you think you have taken too much of this medicine contact a poison control center or emergency room at once. ?NOTE: This medicine is only for you. Do not share this medicine with others. ?What if I miss a dose? ?It is important not to miss your dose. Call your doctor or health care professional if you are unable to keep an appointment. ?What may interact with this medication? ?Do not take this medicine with any of the following medications: ?cisapride ?dronedarone ?pimozide ?thioridazine ?This medicine may also interact with the following medications: ?other medicines that prolong the QT interval (an abnormal heart  rhythm) ?This list may not describe all possible interactions. Give your health care provider a list of all the medicines, herbs, non-prescription drugs, or dietary supplements you use. Also tell them if you smoke, drink alcohol, or use illegal drugs. Some items may interact with your medicine. ?What should I watch for while using this medication? ?Visit your doctor or health care provider for regular checks on your progress. Your symptoms may appear to get worse during the first weeks of this therapy. Tell your doctor or healthcare provider if your symptoms do not start to get better or if they get worse after this time. ?Your bones may get weaker if you take this medicine for a long time. If you smoke or frequently drink alcohol you may increase your risk of bone loss. A family history of osteoporosis, chronic use of drugs for seizures (convulsions), or corticosteroids can also increase your risk of bone loss. Talk to your doctor about how to keep your bones strong. ?This medicine should stop regular monthly menstruation in women. Tell your doctor if you continue to menstruate. ?Women should not become pregnant while taking this medicine or for 12 weeks after stopping this medicine. Women should inform their doctor if they wish to become pregnant or think they might be pregnant. There is a potential for serious side effects to an unborn child. Talk to your health care professional or pharmacist for more information. Do not breast-feed an infant while taking this medicine. ?Men should inform their doctors if they wish to father a child. This medicine may lower sperm counts. Talk to your health care professional or pharmacist for more information. ?This  medicine may increase blood sugar. Ask your healthcare provider if changes in diet or medicines are needed if you have diabetes. What side effects may I notice from receiving this medication? Side effects that you should report to your doctor or health care  professional as soon as possible: allergic reactions like skin rash, itching or hives, swelling of the face, lips, or tongue bone pain breathing problems changes in vision chest pain feeling faint or lightheaded, falls fever, chills pain, swelling, warmth in the leg pain, tingling, numbness in the hands or feet signs and symptoms of high blood sugar such as being more thirsty or hungry or having to urinate more than normal. You may also feel very tired or have blurry vision signs and symptoms of low blood pressure like dizziness; feeling faint or lightheaded, falls; unusually weak or tired stomach pain swelling of the ankles, feet, hands trouble passing urine or change in the amount of urine unusually high or low blood pressure unusually weak or tired Side effects that usually do not require medical attention (report to your doctor or health care professional if they continue or are bothersome): change in sex drive or performance changes in breast size in both males and females changes in emotions or moods headache hot flashes irritation at site where injected loss of appetite skin problems like acne, dry skin vaginal dryness This list may not describe all possible side effects. Call your doctor for medical advice about side effects. You may report side effects to FDA at 1-800-FDA-1088. Where should I keep my medication? This drug is given in a hospital or clinic and will not be stored at home. NOTE: This sheet is a summary. It may not cover all possible information. If you have questions about this medicine, talk to your doctor, pharmacist, or health care provider.  2023 Elsevier/Gold Standard (2018-12-18 00:00:00)  

## 2021-12-18 NOTE — Progress Notes (Signed)
[place]  ?     Telephone: [phone]?Fax: [phone]  ? ?Oncology Clinical Pharmacist Practitioner Progress Note ? ?Shannon Hale was contacted via in-person to discuss her chemotherapy regimen for abemaciclib which they receive under the care of Dr. Nicholas Lose. ?  ?Current treatment regimen and start date ?Abemaciclib (09/03/21) ?Goserelin (08/22/21) ?Letrozole (08/02/21) ?  ?Interval History ?She continues on abemaciclib 150 mg by mouth every 12 hours on days 1 to 28 of a 28-day cycle. This is being given  in combination with letrozole and goserelin . Therapy is planned to continue until  2 years in the adjuvant setting per the monarchE trial data .  ? ?Response to Therapy ?Shannon Hale is seen today by clinical pharmacy as follow-up to her abemaciclib medication she was last seen on 11/20/21.  She has a follow-up appointment with Dr. Lindi Adie and labs on 01/15/22.  Shannon Hale states that she is doing well.  She has been having some diarrhea mixed with constipation.  She states that the episodes are more irregular than anything.  She states that she is eating plenty of vegetables, exercising, and drinking plenty of fluids.  Her serum creatinine has normalized.  We discussed potentially adding some fruits into her diet for increased fiber intake and to continue to avoid white Pasta, white rice, and white bread when constipated.  We also discussed some different options such as Miralax and senna S.  Shannon Hale states that she might also try a probiotic.  She knows to contact the clinic should she have diarrhea that does not respond to her current antidiarrheal medications or for constipation lasting several days. ? ?At her last visit, her calcium was slightly elevated and we had discussed going to every other day with her vitamin D supplement.  Today her calcium has normalized.  She also states that the nausea she was having is now resolved.  We did order a DEXA scan in February with an  expected date of early April.  Shannon Hale prefers the [facility] location.  This is yet to be scheduled and we will reach out to imaging to see when she can be scheduled for this exam.  She has been on letrozole since December of last year.  She reports 3 missed doses of her abemaciclib.  This was due to her traveling and not having her abemaciclib with her.  We again reviewed the signs and symptoms of pneumonitis and DVT.  She will receive goserelin today and then see Dr. Lindi Adie again in 4 weeks with labs and goserelin.  After that appointment, if she is tolerating abemaciclib well, we discussed having labs done every other month.  She is in agreement with this plan. ? ?At her last visit we had discussed needing a form for her upcoming jury duty.  After her visit today, she says that this is no longer needed.. Labs, vitals, treatment parameters, and manufacturer guidelines assessing toxicity were reviewed with Shannon Hale today. Based on these values, patient is in agreement to continue abemaciclib therapy at this time. ? ?Allergies ?No Known Allergies ? ?Vitals ? ?  11/20/2021  ?  2:00 PM 11/20/2021  ?  1:44 PM 10/17/2021  ?  2:56 PM  ?Vitals with BMI  ?Height  '5\' 9"'$  '5\' 9"'$   ?Weight  205 lbs 13 oz 205 lbs 13 oz  ?BMI  30.38 30.38  ?Systolic 811 572 620  ?Diastolic 76 74 71  ?Pulse 72 87 79  ?  ? ?  Laboratory Data ? ?  Latest Ref Rng & Units 11/20/2021  ?  1:20 PM 10/17/2021  ?  2:48 PM 09/18/2021  ?  2:36 PM  ?CBC EXTENDED  ?WBC 4.0 - 10.5 K/uL 4.9   6.9   6.4    ?RBC 3.87 - 5.11 MIL/uL 4.43   4.64   4.75    ?Hemoglobin 12.0 - 15.0 g/dL 12.6   12.6   13.2    ?HCT 36.0 - 46.0 % 37.0   37.7   39.2    ?Platelets 150 - 400 K/uL 293   299   315    ?NEUT# 1.7 - 7.7 K/uL 2.8   4.5   4.1    ?Lymph# 0.7 - 4.0 K/uL 1.6   1.9   1.7    ?  ? ?  Latest Ref Rng & Units 11/20/2021  ?  1:20 PM 10/17/2021  ?  2:48 PM 09/18/2021  ?  2:36 PM  ?CMP  ?Glucose 70 - 99 mg/dL 93   96   91    ?BUN 6 - 20 mg/dL '8   15   17    '$ ?Creatinine 0.44 - 1.00  mg/dL 1.02   1.03   1.17    ?Sodium 135 - 145 mmol/L 140   139   138    ?Potassium 3.5 - 5.1 mmol/L 4.3   4.1   4.3    ?Chloride 98 - 111 mmol/L 103   104   103    ?CO2 22 - 32 mmol/L '30   29   27    '$ ?Calcium 8.9 - 10.3 mg/dL 10.4   10.1   10.0    ?Total Protein 6.5 - 8.1 g/dL 8.3   7.9   8.5    ?Total Bilirubin 0.3 - 1.2 mg/dL 0.4   0.3   0.4    ?Alkaline Phos 38 - 126 U/L 75   74   91    ?AST 15 - 41 U/L '18   14   20    '$ ?ALT 0 - 44 U/L '14   12   14    '$ ?  ?No results found for: MG ? ?Adverse Effects Assessment ?Diarrhea / Constipation: as above, Shannon Hale states she has not been regular. She may try a pro-biotic and we also gave over the counter options such as Miralax and Senna-S when she is constipated.  We also encouraged Shannon Hale to increase her fruit intake for fiber.  She reports drinking plenty of fluids, eating the recommended servings of vegetables, and exercising regularly.  ?Nausea: resolved ?Hypercalcemia: resolved ?Increase Scr: resolved ? ?Adherence Assessment ?Shannon Hale reports missing 3 doses over the past 4 weeks.   ?Reason for missed dose: traveling during her scheduled administration time ?Patient was re-educated on importance of adherence.  ? ?Access Assessment ?Shannon Hale is currently receiving her abemaciclib through [facility]  ?Insurance concerns:  none ? ?Medication Reconciliation ?The patient's medication list was reviewed today with the patient? Yes ?New medications or herbal supplements have recently been started? No  ?Any medications have been discontinued? No  ?The medication list was updated and reconciled based on the patient's most recent medication list in the electronic medical record (EMR) including herbal products and OTC medications.  ? ?Medications ?Current Outpatient Medications  ?Medication Sig Dispense Refill  ? abemaciclib (VERZENIO) 150 MG tablet Take 1 tablet (150 mg total) by mouth 2 (two) times daily. 56 tablet 3  ?  Acetylcysteine  (N-ACETYL-L-CYSTEINE) 600 MG CAPS Take 600 mg by mouth. (Patient not taking: Reported on 10/17/2021)    ? AMBULATORY NON FORMULARY MEDICATION Eg Cg Boost supplement (Patient not taking: Reported on 10/17/2021)    ? APPLE CIDER VINEGAR PO Take 450 mg by mouth. (Patient not taking: Reported on 10/17/2021)    ? ascorbic acid (VITAMIN C) 1000 MG tablet Take by mouth.    ? Cholecalciferol (VITAMIN D-3) 125 MCG (5000 UT) TABS Take 125 mcg by mouth.    ? l-methylfolate-B6-B12 (METANX) 3-35-2 MG TABS tablet Take 1 tablet by mouth daily.    ? letrozole (FEMARA) 2.5 MG tablet Take 1 tablet (2.5 mg total) by mouth daily. 90 tablet 3  ? methocarbamol (ROBAXIN) 500 MG tablet Take 1 tablet (500 mg total) by mouth 2 (two) times daily as needed for muscle spasms. (Patient not taking: Reported on 10/17/2021) 14 tablet 0  ? NON FORMULARY  (Patient not taking: Reported on 10/17/2021)    ? ondansetron (ZOFRAN) 4 MG tablet Take 1 tablet (4 mg total) by mouth every 8 (eight) hours as needed for nausea or vomiting. (Patient not taking: Reported on 10/17/2021) 30 tablet 2  ? Rimegepant Sulfate (NURTEC) 75 MG TBDP Use one tablet daily as needed for migraine (Patient not taking: Reported on 10/17/2021) 5 tablet 1  ? Turmeric 450 MG CAPS Take by mouth.    ? vitamin E 180 MG (400 UNITS) capsule Take 400 Units by mouth daily.    ? zinc gluconate 50 MG tablet Take by mouth.    ? ?No current facility-administered medications for this visit.  ? ? ?Drug-Drug Interactions (DDIs) ?DDIs were evaluated? Yes ?Significant DDIs? No  ?The patient was instructed to speak with their health care provider and/or the oral chemotherapy pharmacist before starting any new drug, including prescription or over the counter, natural / herbal products, or vitamins. ? ?Supportive Care ?Diarrhea: we reviewed that diarrhea is common with abemaciclib and confirmed that she does have loperamide (Imodium) at home.  We reviewed how to take this medication PRN ?Neutropenia: we  discussed the importance of having a thermometer and what the Centers for Disease Control and Prevention (CDC) considers a fever which is 100.4?F (38?C) or higher.  Gave patient 24/7 triage line to call if any fe

## 2021-12-24 ENCOUNTER — Ambulatory Visit: Payer: 59 | Attending: General Surgery

## 2021-12-24 VITALS — Wt 210.4 lb

## 2021-12-24 DIAGNOSIS — Z483 Aftercare following surgery for neoplasm: Secondary | ICD-10-CM | POA: Insufficient documentation

## 2021-12-24 NOTE — Therapy (Signed)
?OUTPATIENT PHYSICAL THERAPY SOZO SCREENING NOTE ? ? ?Patient Name: Shannon Hale ?MRN: [id number] ?DOB:[birth year], 41 y.o., female ?Today's Date: 12/24/2021 ? ?PCP: Darreld Mclean, MD ?REFERRING PROVIDER: Rolm Bookbinder, MD ? ? PT End of Session - 12/24/21 1657   ? ? Visit Number 4   # unchanged due to screen only  ? PT Start Time 1700   ? PT Stop Time 1749   ? PT Time Calculation (min) 9 min   ? Activity Tolerance Patient tolerated treatment well   ? Behavior During Therapy [facility] for tasks assessed/performed   ? ?  ?  ? ?  ? ? ?Past Medical History:  ?Diagnosis Date  ? Abnormal Pap smear   ? Headache(784.0)   ? migraines  ? Hepatitis 03/2004  ? unknown form of hepatitis, no current liver problems  ? History of chicken pox   ? History of D&C   ? D&E  ? Migraine   ? Nausea   ? PONV (postoperative nausea and vomiting)   ? Vaginal Pap smear, abnormal   ? ?Past Surgical History:  ?Procedure Laterality Date  ? BREAST RECONSTRUCTION Bilateral 06/06/2021  ? Procedure: BILATERAL BREAST RECONSTRUCTION WITH TISSUE REARRANGEMENT;  Surgeon: Wallace Going, DO;  Location: [facility];  Service: Plastics;  Laterality: Bilateral;  ? CHOLECYSTECTOMY N/A 12/01/2013  ? Procedure: LAPAROSCOPIC CHOLECYSTECTOMY ;  Surgeon: Harl Bowie, MD;  Location: [facility];  Service: General;  Laterality: N/A;  ? DILATION AND EVACUATION N/A 07/14/2014  ? Procedure: DILATATION AND EVACUATION;  Surgeon: Marylynn Pearson, MD;  Location: [facility];  Service: Gynecology;  Laterality: N/A;  ? MASTECTOMY W/ SENTINEL NODE BIOPSY Right 06/06/2021  ? Procedure: RIGHT MASTECTOMY WITH AXILLARY SENTINEL LYMPH NODE BIOPSY;  Surgeon: Rolm Bookbinder, MD;  Location: [place];  Service: General;  Laterality: Right;  ? RADIOACTIVE SEED GUIDED AXILLARY SENTINEL LYMPH NODE Right 06/06/2021  ? Procedure: RADIOACTIVE SEED GUIDED RIGHT AXILLARY SENTINEL LYMPH NODE EXCISION;  Surgeon: Rolm Bookbinder, MD;  Location: [place];  Service: General;  Laterality: Right;  ? SEPTOPLASTY    ? TONSILLECTOMY    ? TOTAL MASTECTOMY Left 06/06/2021  ? Procedure: LEFT TOTAL MASTECTOMY;  Surgeon: Rolm Bookbinder, MD;  Location: [place];  Service: General;  Laterality: Left;  ? WISDOM TOOTH EXTRACTION    ? ?Patient Active Problem List  ? Diagnosis Date Noted  ? S/P bilateral mastectomy 06/06/2021  ? Breast cancer ([place]) 03/27/2021  ? Carcinoma of overlapping sites of right breast in female, estrogen receptor positive ([place]) 03/22/2021  ? Headache 06/26/2020  ? Rash 06/26/2020  ? New daily persistent headache 05/21/2018  ? Disorder of ligament of left hand 07/20/2015  ? Obesity 09/25/2014  ? Dermatitis 12/31/2013  ? Cholelithiasis 02/29/2012  ? BACK PAIN, THORACIC REGION 11/19/2010  ? APHTHOUS STOMATITIS 08/10/2009  ? MIGRAINE HEADACHE 09/23/2008  ? ? ?REFERRING DIAG: right breast cancer at risk for lymphedema ? ?THERAPY DIAG:  ?Aftercare following surgery for neoplasm ? ?PERTINENT HISTORY: right breast cancer ER/PR+, Her2-, Ki 67 20% . Bilateral mastectomy with no reconstruction 06/06/21. No chemotherapy. Still thinking about radiation.  4/4 nodes positive on the Rt.  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  She was told in the past that she has "scapular rotation" on the right  ? ?PRECAUTIONS: right UE Lymphedema risk, None ? ?SUBJECTIVE: Pt returns for her 3 month L-Dex screen.  ? ?PAIN:  ?Are you having  pain? No ? ?SOZO SCREENING: ?Patient was assessed today using the SOZO machine to determine the lymphedema index score. This was compared to her baseline score. It was determined that she is within the recommended range when compared to her baseline and no further action is needed at this time. She will continue SOZO screenings. These are done every 3 months for 2 years post operatively followed by every 6 months for 2 years, and then annually. ? ? ? ?Otelia Limes,  PTA ?12/24/2021, 5:07 PM ? ?  ? ?

## 2021-12-27 ENCOUNTER — Other Ambulatory Visit (HOSPITAL_COMMUNITY): Payer: Self-pay

## 2021-12-31 ENCOUNTER — Other Ambulatory Visit: Payer: Self-pay

## 2022-01-01 NOTE — Progress Notes (Signed)
? ?Patient Care Team: ?Copland, Gay Filler, MD as PCP - General (Family Medicine) ?Theodore Demark, RN as Oncology Nurse Navigator ?Rockwell Germany, RN as Oncology Nurse Navigator ?Mauro Kaufmann, RN as Oncology Nurse Navigator ?Nicholas Lose, MD as Medical Oncologist (Hematology and Oncology) ? ?DIAGNOSIS:  ?Encounter Diagnosis  ?Name Primary?  ? Carcinoma of overlapping sites of right breast in female, estrogen receptor positive ([place])   ? ? ?SUMMARY OF ONCOLOGIC HISTORY: ?Oncology History  ?Carcinoma of overlapping sites of right breast in female, estrogen receptor positive ([place])  ?03/14/2021 Initial Diagnosis  ? Patient palpated a lump in the retroareolar right breast with nipple retraction.  Mammogram revealed right breast mass 2.6 cm and a single right axillary lymph node measuring 1.2 cm.  (PET/CT negative) breast MRI revealed 4.2 cm right retroareolar mass with non-mass enhancement total measuring 14 cm, intraparenchymal lymph node 1.1 cm.  2 abnormal right axillary lymph nodes, biopsy revealed grade 2 IDC with DCIS ER 95%, PR 95%, Ki-67 20% focally 50%, HER2 negative, lymph node positive ?  ?04/10/2021 Cancer Staging  ? Staging form: Breast, AJCC 8th Edition ?- Clinical: Stage IIA (cT2, cN1, cM0, G2, ER+, PR+, HER2-) - Signed by Nicholas Lose, MD on 04/10/2021 ?Stage prefix: Initial diagnosis ?Histologic grading system: 3 grade system ? ?  ?04/24/2021 -  Anti-estrogen oral therapy  ? Tamoxifen 10 mg daily started neoadjuvantly because of slight delay in surgery ?  ?06/06/2021 Surgery  ? Bilateral mastectomies 06/06/2021: [facility]: ?Left mastectomy: Grade 2 IDC 0.3 cm, margins negative ?Right mastectomy: Grade 3 IDC with DCIS 7.5 cm, margins negative, 4/4 lymph nodes positive ER 95%, PR 95%, HER2 1+, Ki-67 20% focally 50% ?  ?09/02/2021 -  Anti-estrogen oral therapy  ? Verzinio with Zoladex and Letrozole ?  ? ? ?CHIEF COMPLIANT:  Follow-up of right breast cancer ? ?INTERVAL HISTORY: Shannon Hale is a  41 y.o.  with above-mentioned history of right breast cancer having undergone bilateral mastectomies. She presents to the clinic today for follow-up.   ? ? ?ALLERGIES:  has No Known Allergies. ? ?MEDICATIONS:  ?Current Outpatient Medications  ?Medication Sig Dispense Refill  ? abemaciclib (VERZENIO) 150 MG tablet Take 1 tablet (150 mg total) by mouth 2 (two) times daily. 56 tablet 3  ? Acetylcysteine (N-ACETYL-L-CYSTEINE) 600 MG CAPS Take 600 mg by mouth. (Patient not taking: Reported on 10/17/2021)    ? AMBULATORY NON FORMULARY MEDICATION Eg Cg Boost supplement (Patient not taking: Reported on 10/17/2021)    ? APPLE CIDER VINEGAR PO Take 450 mg by mouth. (Patient not taking: Reported on 10/17/2021)    ? ascorbic acid (VITAMIN C) 1000 MG tablet Take by mouth.    ? Cholecalciferol (VITAMIN D-3) 125 MCG (5000 UT) TABS Take 125 mcg by mouth.    ? l-methylfolate-B6-B12 (METANX) 3-35-2 MG TABS tablet Take 1 tablet by mouth daily.    ? letrozole (FEMARA) 2.5 MG tablet Take 1 tablet (2.5 mg total) by mouth daily. 90 tablet 3  ? methocarbamol (ROBAXIN) 500 MG tablet Take 1 tablet (500 mg total) by mouth 2 (two) times daily as needed for muscle spasms. (Patient not taking: Reported on 10/17/2021) 14 tablet 0  ? NON FORMULARY  (Patient not taking: Reported on 10/17/2021)    ? ondansetron (ZOFRAN) 4 MG tablet Take 1 tablet (4 mg total) by mouth every 8 (eight) hours as needed for nausea or vomiting. (Patient not taking: Reported on 10/17/2021) 30 tablet 2  ? Rimegepant Sulfate (NURTEC) 75 MG  TBDP Use one tablet daily as needed for migraine (Patient not taking: Reported on 10/17/2021) 5 tablet 1  ? Turmeric 450 MG CAPS Take by mouth.    ? vitamin E 180 MG (400 UNITS) capsule Take 400 Units by mouth daily.    ? zinc gluconate 50 MG tablet Take by mouth.    ? ?No current facility-administered medications for this visit.  ? ?Facility-Administered Medications Ordered in Other Visits  ?Medication Dose Route Frequency Provider Last Rate Last Admin   ? goserelin (ZOLADEX) injection 3.6 mg  3.6 mg Subcutaneous Once Nicholas Lose, MD      ? ? ?PHYSICAL EXAMINATION: ?ECOG PERFORMANCE STATUS: 1 - Symptomatic but completely ambulatory ? ?Vitals:  ? 01/15/22 1506  ?BP: 128/87  ?Pulse: 88  ?Resp: 18  ?Temp: 98.1 ?F (36.7 ?C)  ?SpO2: 100%  ? ?Filed Weights  ? 01/15/22 1506  ?Weight: 206 lb (93.4 kg)  ? ? ?BREAST: No palpable masses or nodules in either right or left breasts. No palpable axillary supraclavicular or infraclavicular adenopathy no breast tenderness or nipple discharge. (exam performed in the presence of a chaperone) ? ?LABORATORY DATA:  ?I have reviewed the data as listed ? ?  Latest Ref Rng & Units 01/15/2022  ?  2:56 PM 12/18/2021  ?  1:41 PM 11/20/2021  ?  1:20 PM  ?CMP  ?Glucose 70 - 99 mg/dL 110   88   93    ?BUN 6 - 20 mg/dL _0 ?Creatinine 0.44 - 1.00 mg/dL 1.16   0.98   1.02    ?Sodium 135 - 145 mmol/L 139   138   140    ?Potassium 3.5 - 5.1 mmol/L 4.1   3.7   4.3    ?Chloride 98 - 111 mmol/L 104   105   103    ?CO2 22 - 32 mmol/L _1 ?Calcium 8.9 - 10.3 mg/dL 10.0   9.6   10.4    ?Total Protein 6.5 - 8.1 g/dL 8.2   7.8   8.3    ?Total Bilirubin 0.3 - 1.2 mg/dL 0.6   0.5   0.4    ?Alkaline Phos 38 - 126 U/L 90   67   75    ?AST 15 - 41 U/L _2 ?ALT 0 - 44 U/L _3 ? ? ?Lab Results  ?Component Value Date  ? WBC 9.2 01/15/2022  ? HGB 13.3 01/15/2022  ? HCT 38.3 01/15/2022  ? MCV 86.7 01/15/2022  ? PLT 346 01/15/2022  ? NEUTROABS 6.8 01/15/2022  ? ? ?ASSESSMENT & PLAN:  ?Carcinoma of overlapping sites of right breast in female, estrogen receptor positive ([place]) ?03/14/2021: Patient palpated a lump in the retroareolar right breast with nipple retraction.  Mammogram revealed right breast mass 2.6 cm and a single right axillary lymph node measuring 1.2 cm.  (PET/CT negative) breast MRI revealed 4.2 cm right retroareolar mass with non-mass enhancement total measuring 14 cm, intraparenchymal lymph node 1.1 cm.  2  abnormal right axillary lymph nodes, biopsy revealed grade 2 IDC with DCIS ER 95%, PR 95%, Ki-67 20% focally 50%, HER2 negative, lymph node positive ?  ?PET CT scan: Negative for distant metastatic disease ?MammaPrint: High risk ?  ?Treatment plan: ?1.  Bilateral mastectomies 06/06/2021: [facility]: ?Left mastectomy: Grade 2 IDC 0.3  cm, margins negative ?Right mastectomy: Grade 3 IDC with DCIS 7.5 cm, margins negative, 4/4 lymph nodes positive ER 95%, PR 95%, HER2 1+, Ki-67 20% focally 50% ?2.  adjuvant chemotherapy with dose dense Adriamycin and Cytoxan x4 followed by Taxol x12 (patient refused chemo and radiation) ?3. Adjuvant antiestrogen therapy with AI plus ovarian suppression plus or minus abemaciclib versus tamoxifen with or without abemaciclib ?------------------------------------------------------------------------------------------------------------------------------------ ?Current treatment: Zoladex with letrozole and abemaciclib started 08/22/2022 ?  ?Abemaciclib toxicities: Current dosing: 150 mg p.o. twice daily ?No significant diarrhea ?Tolerating it extremely well. ?  ?Return to clinic in  2 months with labs and follow-up with Jenny Reichmann. ?She comes monthly for injections. ? ?Abdominal pain: CT CAP will be obtained before her appointment in July. ?After the follow-up in July we can see her every 3 months ? ? ? ?Orders Placed This Encounter  ?Procedures  ? CT CHEST ABDOMEN PELVIS W CONTRAST  ?  Standing Status:   Future  ?  Standing Expiration Date:   01/16/2023  ?  Order Specific Question:   Is patient pregnant?  ?  Answer:   No  ?  Order Specific Question:   Preferred imaging location?  ?  Answer:   [facility]  ?  Order Specific Question:   Release to patient  ?  Answer:   Immediate  ?  Order Specific Question:   Is Oral Contrast requested for this exam?  ?  Answer:   Yes, Per Radiology protocol  ? ?The patient has a good understanding of the overall plan. she agrees with it. she will call with any  problems that may develop before the next visit here. ?Total time spent: 30 mins including face to face time and time spent for planning, charting and co-ordination of care ? ? Harriette Ohara, MD ?01/15/22 ?

## 2022-01-02 ENCOUNTER — Other Ambulatory Visit (HOSPITAL_COMMUNITY): Payer: Self-pay

## 2022-01-09 ENCOUNTER — Ambulatory Visit: Payer: 59 | Admitting: Pharmacist

## 2022-01-09 ENCOUNTER — Other Ambulatory Visit: Payer: 59

## 2022-01-09 ENCOUNTER — Ambulatory Visit: Payer: 59

## 2022-01-15 ENCOUNTER — Inpatient Hospital Stay: Payer: 59

## 2022-01-15 ENCOUNTER — Other Ambulatory Visit: Payer: Self-pay

## 2022-01-15 ENCOUNTER — Inpatient Hospital Stay: Payer: 59 | Admitting: Hematology and Oncology

## 2022-01-15 ENCOUNTER — Inpatient Hospital Stay: Payer: 59 | Attending: Hematology and Oncology

## 2022-01-15 DIAGNOSIS — C50112 Malignant neoplasm of central portion of left female breast: Secondary | ICD-10-CM | POA: Insufficient documentation

## 2022-01-15 DIAGNOSIS — Z5111 Encounter for antineoplastic chemotherapy: Secondary | ICD-10-CM | POA: Diagnosis not present

## 2022-01-15 DIAGNOSIS — Z170 Estrogen receptor positive status [ER+]: Secondary | ICD-10-CM

## 2022-01-15 DIAGNOSIS — C50811 Malignant neoplasm of overlapping sites of right female breast: Secondary | ICD-10-CM

## 2022-01-15 DIAGNOSIS — Z79899 Other long term (current) drug therapy: Secondary | ICD-10-CM | POA: Diagnosis not present

## 2022-01-15 LAB — CMP (CANCER CENTER ONLY)
ALT: 21 U/L (ref 0–44)
AST: 22 U/L (ref 15–41)
Albumin: 4.8 g/dL (ref 3.5–5.0)
Alkaline Phosphatase: 90 U/L (ref 38–126)
Anion gap: 6 (ref 5–15)
BUN: 13 mg/dL (ref 6–20)
CO2: 29 mmol/L (ref 22–32)
Calcium: 10 mg/dL (ref 8.9–10.3)
Chloride: 104 mmol/L (ref 98–111)
Creatinine: 1.16 mg/dL — ABNORMAL HIGH (ref 0.44–1.00)
GFR, Estimated: >60 mL/min (ref >60)
Glucose, Bld: 110 mg/dL — ABNORMAL HIGH (ref 70–99)
Potassium: 4.1 mmol/L (ref 3.5–5.1)
Sodium: 139 mmol/L (ref 135–145)
Total Bilirubin: 0.6 mg/dL (ref 0.3–1.2)
Total Protein: 8.2 g/dL — ABNORMAL HIGH (ref 6.5–8.1)

## 2022-01-15 LAB — CBC WITH DIFFERENTIAL (CANCER CENTER ONLY)
Abs Immature Granulocytes: 0.02 10*3/uL (ref 0.00–0.07)
Basophils Absolute: 0.1 10*3/uL (ref 0.0–0.1)
Basophils Relative: 1 %
Eosinophils Absolute: 0.1 10*3/uL (ref 0.0–0.5)
Eosinophils Relative: 1 %
HCT: 38.3 % (ref 36.0–46.0)
Hemoglobin: 13.3 g/dL (ref 12.0–15.0)
Immature Granulocytes: 0 %
Lymphocytes Relative: 18 %
Lymphs Abs: 1.7 10*3/uL (ref 0.7–4.0)
MCH: 30.1 pg (ref 26.0–34.0)
MCHC: 34.7 g/dL (ref 30.0–36.0)
MCV: 86.7 fL (ref 80.0–100.0)
Monocytes Absolute: 0.5 10*3/uL (ref 0.1–1.0)
Monocytes Relative: 6 %
Neutro Abs: 6.8 10*3/uL (ref 1.7–7.7)
Neutrophils Relative %: 74 %
Platelet Count: 346 10*3/uL (ref 150–400)
RBC: 4.42 MIL/uL (ref 3.87–5.11)
RDW: 13.1 % (ref 11.5–15.5)
WBC Count: 9.2 10*3/uL (ref 4.0–10.5)
nRBC: 0 % (ref 0.0–0.2)

## 2022-01-15 MED ORDER — GOSERELIN ACETATE 3.6 MG ~~LOC~~ IMPL
3.6000 mg | DRUG_IMPLANT | Freq: Once | SUBCUTANEOUS | Status: AC
  Administered 2022-01-15: 3.6 mg via SUBCUTANEOUS
  Filled 2022-01-15: qty 3.6

## 2022-01-15 NOTE — Assessment & Plan Note (Signed)
03/14/2021:?Patient palpated a lump in the retroareolar right breast with nipple retraction. ?Mammogram revealed right breast mass 2.6 cm and a single right axillary lymph node measuring 1.2 cm. ?(PET/CT negative) breast MRI revealed 4.2 cm right retroareolar mass with non-mass enhancement total measuring 14 cm, intraparenchymal lymph node 1.1 cm. ?2 abnormal right axillary lymph nodes, biopsy revealed grade 2 IDC with DCIS ER 95%, PR 95%, Ki-67 20% focally 50%, HER2 negative, lymph node positive ?? ?PET CT scan: Negative for distant metastatic disease ?MammaPrint: High risk ?? ?Treatment plan: ?1.??Bilateral mastectomies 06/06/2021: [facility]: ?Left mastectomy: Grade 2 IDC 0.3 cm, margins negative ?Right mastectomy: Grade 3 IDC with DCIS 7.5 cm, margins negative, 4/4 lymph nodes positive ER 95%, PR 95%, HER2 1+, Ki-67 20% focally 50% ?2.??adjuvant chemotherapy?with dose dense Adriamycin and Cytoxan x4 followed by Taxol x12?(patient refused chemo and radiation) ?3.?Adjuvant antiestrogen therapy with AI plus ovarian suppression plus or minus abemaciclib versus tamoxifen with or without abemaciclib ?------------------------------------------------------------------------------------------------------------------------------------ ?Current treatment: Zoladex with letrozole and abemaciclib started 08/22/2022 ?? ?Abemaciclib toxicities: Current dosing: 150 mg p.o. twice daily ? ?? ?Return to clinic in  2 months with labs and follow-up ?

## 2022-01-23 ENCOUNTER — Other Ambulatory Visit (HOSPITAL_COMMUNITY): Payer: Self-pay

## 2022-01-31 ENCOUNTER — Other Ambulatory Visit (HOSPITAL_COMMUNITY): Payer: Self-pay

## 2022-02-06 ENCOUNTER — Ambulatory Visit: Payer: 59

## 2022-02-12 ENCOUNTER — Other Ambulatory Visit: Payer: Self-pay

## 2022-02-12 ENCOUNTER — Inpatient Hospital Stay: Payer: 59 | Attending: Hematology and Oncology

## 2022-02-12 VITALS — BP 117/78 | HR 89 | Temp 98.5°F | Resp 18

## 2022-02-12 DIAGNOSIS — Z5111 Encounter for antineoplastic chemotherapy: Secondary | ICD-10-CM | POA: Diagnosis not present

## 2022-02-12 DIAGNOSIS — Z79899 Other long term (current) drug therapy: Secondary | ICD-10-CM | POA: Diagnosis not present

## 2022-02-12 DIAGNOSIS — Z170 Estrogen receptor positive status [ER+]: Secondary | ICD-10-CM

## 2022-02-12 DIAGNOSIS — C50112 Malignant neoplasm of central portion of left female breast: Secondary | ICD-10-CM | POA: Insufficient documentation

## 2022-02-12 DIAGNOSIS — C50811 Malignant neoplasm of overlapping sites of right female breast: Secondary | ICD-10-CM | POA: Insufficient documentation

## 2022-02-12 MED ORDER — GOSERELIN ACETATE 3.6 MG ~~LOC~~ IMPL
3.6000 mg | DRUG_IMPLANT | Freq: Once | SUBCUTANEOUS | Status: AC
  Administered 2022-02-12: 3.6 mg via SUBCUTANEOUS
  Filled 2022-02-12: qty 3.6

## 2022-02-25 ENCOUNTER — Other Ambulatory Visit (HOSPITAL_COMMUNITY): Payer: Self-pay

## 2022-02-28 ENCOUNTER — Other Ambulatory Visit (HOSPITAL_COMMUNITY): Payer: Self-pay

## 2022-03-03 ENCOUNTER — Ambulatory Visit (HOSPITAL_BASED_OUTPATIENT_CLINIC_OR_DEPARTMENT_OTHER)
Admission: RE | Admit: 2022-03-03 | Discharge: 2022-03-03 | Disposition: A | Discharge status: Home / Self Care | Payer: 59 | Source: Ambulatory Visit | Attending: Hematology and Oncology | Admitting: Hematology and Oncology

## 2022-03-03 DIAGNOSIS — Z170 Estrogen receptor positive status [ER+]: Secondary | ICD-10-CM | POA: Insufficient documentation

## 2022-03-03 DIAGNOSIS — C50811 Malignant neoplasm of overlapping sites of right female breast: Secondary | ICD-10-CM | POA: Insufficient documentation

## 2022-03-03 DIAGNOSIS — C50919 Malignant neoplasm of unspecified site of unspecified female breast: Secondary | ICD-10-CM | POA: Diagnosis not present

## 2022-03-03 MED ORDER — IOHEXOL 300 MG/ML  SOLN
100.0000 mL | Freq: Once | INTRAMUSCULAR | Status: AC | PRN
  Administered 2022-03-03: 100 mL via INTRAVENOUS

## 2022-03-06 ENCOUNTER — Ambulatory Visit: Payer: 59

## 2022-03-13 ENCOUNTER — Other Ambulatory Visit: Payer: Self-pay

## 2022-03-13 ENCOUNTER — Inpatient Hospital Stay: Payer: 59 | Admitting: Pharmacist

## 2022-03-13 ENCOUNTER — Encounter: Payer: Self-pay | Admitting: Pharmacist

## 2022-03-13 ENCOUNTER — Inpatient Hospital Stay: Payer: 59 | Attending: Hematology and Oncology

## 2022-03-13 ENCOUNTER — Inpatient Hospital Stay: Payer: 59

## 2022-03-13 VITALS — BP 127/80 | HR 76 | Temp 98.1°F | Resp 16 | Ht 69.0 in | Wt 209.5 lb

## 2022-03-13 DIAGNOSIS — Z5111 Encounter for antineoplastic chemotherapy: Secondary | ICD-10-CM | POA: Insufficient documentation

## 2022-03-13 DIAGNOSIS — C50811 Malignant neoplasm of overlapping sites of right female breast: Secondary | ICD-10-CM

## 2022-03-13 DIAGNOSIS — C50112 Malignant neoplasm of central portion of left female breast: Secondary | ICD-10-CM | POA: Insufficient documentation

## 2022-03-13 DIAGNOSIS — Z79899 Other long term (current) drug therapy: Secondary | ICD-10-CM | POA: Insufficient documentation

## 2022-03-13 LAB — CMP (CANCER CENTER ONLY)
ALT: 18 U/L (ref 0–44)
AST: 18 U/L (ref 15–41)
Albumin: 4.8 g/dL (ref 3.5–5.0)
Alkaline Phosphatase: 78 U/L (ref 38–126)
Anion gap: 9 (ref 5–15)
BUN: 12 mg/dL (ref 6–20)
CO2: 26 mmol/L (ref 22–32)
Calcium: 10.6 mg/dL — ABNORMAL HIGH (ref 8.9–10.3)
Chloride: 106 mmol/L (ref 98–111)
Creatinine: 1.02 mg/dL — ABNORMAL HIGH (ref 0.44–1.00)
GFR, Estimated: >60 mL/min (ref >60)
Glucose, Bld: 95 mg/dL (ref 70–99)
Potassium: 4.2 mmol/L (ref 3.5–5.1)
Sodium: 141 mmol/L (ref 135–145)
Total Bilirubin: 0.3 mg/dL (ref 0.3–1.2)
Total Protein: 8.7 g/dL — ABNORMAL HIGH (ref 6.5–8.1)

## 2022-03-13 LAB — CBC WITH DIFFERENTIAL (CANCER CENTER ONLY)
Abs Immature Granulocytes: 0.01 10*3/uL (ref 0.00–0.07)
Basophils Absolute: 0.1 10*3/uL (ref 0.0–0.1)
Basophils Relative: 1 %
Eosinophils Absolute: 0.1 10*3/uL (ref 0.0–0.5)
Eosinophils Relative: 2 %
HCT: 37.5 % (ref 36.0–46.0)
Hemoglobin: 13 g/dL (ref 12.0–15.0)
Immature Granulocytes: 0 %
Lymphocytes Relative: 30 %
Lymphs Abs: 1.8 10*3/uL (ref 0.7–4.0)
MCH: 30.1 pg (ref 26.0–34.0)
MCHC: 34.7 g/dL (ref 30.0–36.0)
MCV: 86.8 fL (ref 80.0–100.0)
Monocytes Absolute: 0.4 10*3/uL (ref 0.1–1.0)
Monocytes Relative: 7 %
Neutro Abs: 3.6 10*3/uL (ref 1.7–7.7)
Neutrophils Relative %: 60 %
Platelet Count: 333 10*3/uL (ref 150–400)
RBC: 4.32 MIL/uL (ref 3.87–5.11)
RDW: 11.9 % (ref 11.5–15.5)
WBC Count: 6 10*3/uL (ref 4.0–10.5)
nRBC: 0 % (ref 0.0–0.2)

## 2022-03-13 MED ORDER — GOSERELIN ACETATE 3.6 MG ~~LOC~~ IMPL
3.6000 mg | DRUG_IMPLANT | Freq: Once | SUBCUTANEOUS | Status: AC
  Administered 2022-03-13: 3.6 mg via SUBCUTANEOUS
  Filled 2022-03-13: qty 3.6

## 2022-03-13 NOTE — Progress Notes (Signed)
Shannon Hale       Telephone: [phone]?Fax: [phone]   Oncology Clinical Pharmacist Practitioner Progress Note  SHYNA Hale was contacted via in-person to discuss her chemotherapy regimen for abemaciclib which they receive under the care of Dr. Nicholas Lose.   Current treatment regimen and start date Abemaciclib (09/03/21) Goserelin (08/22/21) Letrozole (08/02/21)   Interval History She continues on abemaciclib 150 mg by mouth every 12 hours on days 1 to 28 of a 28-day cycle. This is being given  in combination with letrozole and goserelin . Therapy is planned to continue until  2 years in the adjuvant setting per the monarchE trial data.   Response to Therapy Shannon Hale was seen today by clinical pharmacy as a follow-up to her abemaciclib management.  She was last seen by clinical pharmacy on 12/18/21 and Dr. Lindi Adie on 01/15/22.  At that time she was doing well and he recommended that after our visit today that she could be seen every 2 months if she continues to tolerate abemaciclib well.  Overall, Shannon Hale states that she is doing well.  She has been under a lot of stress over the last month or 2 with several deaths in her family including her mother.  She is having some trouble sleeping which she says may be attributed to all of the stress that she has been under.  We did recommend her to practice good sleep hygiene and to possibly consider melatonin at night since she feels she is maybe not getting good sleep although she is sleeping 7 to 8 hours a night.  She continues on every 28-day goserelin which will be given today.  She also continues on letrozole daily.  Clinical pharmacy had ordered a DEXA scan in February and we had follow-up with scheduling in April but today Shannon Hale said that she never did receive a call for the DEXA scan.  We contacted scheduling today while she was in our office, and her DEXA scan is now scheduled for 04/17/22 and we discussed that  this will likely be done every 2 years to measure her bone density.  She does report for missed doses of the abemaciclib over the last 4 weeks.  She does have alerts on her phone and her missing doses is quite uncommon.  She again attributes this to all the stress and things that they had going on over the past month.  She did have a scan done on 03/03/22 and per the radiologist report "Fluid attenuation lesion in the inferior right axilla, new compared to prior examination, measuring 1.1 x 1.1 cm, given attenuation most likely a small postoperative seroma, new metastatic lymphadenopathy not favored but not strictly excluded. This could be further evaluated for fluid composition by targeted mammographic ultrasound.  Shannon Hale inquired about whether any follow-up would be needed and she would like me to forward my visit note from today to Dr. Lindi Adie to see if further imaging is required.  Her calcium today is estimated at 10.6 mg/dL.  It was last elevated on 11/20/21 when it was 10.4 mg/dL.  At that time, we had recommended every other day vitamin D supplementation as at that time she was taking it daily.  These labs resulted after our visit today and we have sent a MyChart message to her recommending to hold the vitamin D supplementation until her next visit in 2 months with Dr. Lindi Adie.  We will also be moving her appointments for goserelin up a  week as her next injection appointment is currently scheduled for 3 weeks from today. Labs, vitals, treatment parameters, and manufacturer guidelines assessing toxicity were reviewed with Shannon Hale today. Based on these values, patient is in agreement to continue abemaciclib therapy at this time.  Allergies No Known Allergies  Vitals    03/13/2022    2:53 PM 02/12/2022    3:48 PM 01/15/2022    3:06 PM  Vitals with BMI  Height '5\' 9"'$   '5\' 9"'$   Weight 209 lbs 8 oz  206 lbs  BMI 24.82  50.03  Systolic 704 888 916  Diastolic 80 78 87  Pulse 76 89 88      Laboratory Data    Latest Ref Rng & Units 03/13/2022    2:27 PM 01/15/2022    2:56 PM 12/18/2021    1:41 PM  CBC EXTENDED  WBC 4.0 - 10.5 K/uL 6.0  9.2  4.4   RBC 3.87 - 5.11 MIL/uL 4.32  4.42  3.95   Hemoglobin 12.0 - 15.0 g/dL 13.0  13.3  11.7   HCT 36.0 - 46.0 % 37.5  38.3  33.7   Platelets 150 - 400 K/uL 333  346  257   NEUT# 1.7 - 7.7 K/uL 3.6  6.8  2.3   Lymph# 0.7 - 4.0 K/uL 1.8  1.7  1.7        Latest Ref Rng & Units 03/13/2022    2:27 PM 01/15/2022    2:56 PM 12/18/2021    1:41 PM  CMP  Glucose 70 - 99 mg/dL 95  110  88   BUN 6 - 20 mg/dL '12  13  7   '$ Creatinine 0.44 - 1.00 mg/dL 1.02  1.16  0.98   Sodium 135 - 145 mmol/L 141  139  138   Potassium 3.5 - 5.1 mmol/L 4.2  4.1  3.7   Chloride 98 - 111 mmol/L 106  104  105   CO2 22 - 32 mmol/L '26  29  26   '$ Calcium 8.9 - 10.3 mg/dL 10.6  10.0  9.6   Total Protein 6.5 - 8.1 g/dL 8.7  8.2  7.8   Total Bilirubin 0.3 - 1.2 mg/dL 0.3  0.6  0.5   Alkaline Phos 38 - 126 U/L 78  90  67   AST 15 - 41 U/L '18  22  16   '$ ALT 0 - 44 U/L '18  21  10     '$ No results found for: "MG"  Adverse Effects Assessment Difficulty sleeping: Likely not due from abemaciclib.  As above, patient has been under an extreme amount of stress the last 2 months with several deaths in the family.  We advised good sleep hygiene practices and to consider nightly melatonin Elevated calcium: As above, she has had elevated calcium in the past and at that time we had recommended every other day vitamin D supplementation.  She will hold vitamin D supplementation for now until she sees Dr. Lindi Adie again in 2 months.  A MyChart message has been sent. Serum creatinine elevation: Today's serum creatinine is estimated at 1.02 mg/dL which is down from her last visit.  Her baseline is estimated at 1.17 mg/dL.  We did recommend to continue to drink plenty of fluids especially with the temperature and humidity increasing lately.  Adherence Assessment SHAKEELA RABADAN reports  missing 4 doses over the past 4 weeks.   Reason for missed dose: Patient had several deaths in the family  and was under an extreme amount of stress.  She forgot to take the medication. Patient was re-educated on importance of adherence.   Access Assessment JASSICA ZAZUETA is currently receiving her abemaciclib through [employer] concerns: None  Medication Reconciliation The patient's medication list was reviewed today with the patient?  Yes New medications or herbal supplements have recently been started?  No Any medications have been discontinued?  No The medication list was updated and reconciled based on the patient's most recent medication list in the electronic medical record (EMR) including herbal products and OTC medications.   Medications Current Outpatient Medications  Medication Sig Dispense Refill   abemaciclib (VERZENIO) 150 MG tablet Take 1 tablet (150 mg total) by mouth 2 (two) times daily. 56 tablet 3   Acetylcysteine (N-ACETYL-L-CYSTEINE) 600 MG CAPS Take 600 mg by mouth. (Patient not taking: Reported on 10/17/2021)     AMBULATORY NON FORMULARY MEDICATION Eg Cg Boost supplement (Patient not taking: Reported on 10/17/2021)     APPLE CIDER VINEGAR PO Take 450 mg by mouth. (Patient not taking: Reported on 10/17/2021)     ascorbic acid (VITAMIN C) 1000 MG tablet Take by mouth.     Cholecalciferol (VITAMIN D-3) 125 MCG (5000 UT) TABS Take 125 mcg by mouth.     l-methylfolate-B6-B12 (METANX) 3-35-2 MG TABS tablet Take 1 tablet by mouth daily.     letrozole (FEMARA) 2.5 MG tablet Take 1 tablet (2.5 mg total) by mouth daily. 90 tablet 3   methocarbamol (ROBAXIN) 500 MG tablet Take 1 tablet (500 mg total) by mouth 2 (two) times daily as needed for muscle spasms. (Patient not taking: Reported on 10/17/2021) 14 tablet 0   NON FORMULARY  (Patient not taking: Reported on 10/17/2021)     ondansetron (ZOFRAN) 4 MG tablet Take 1 tablet (4 mg total) by mouth  every 8 (eight) hours as needed for nausea or vomiting. (Patient not taking: Reported on 10/17/2021) 30 tablet 2   Rimegepant Sulfate (NURTEC) 75 MG TBDP Use one tablet daily as needed for migraine (Patient not taking: Reported on 10/17/2021) 5 tablet 1   Turmeric 450 MG CAPS Take by mouth.     vitamin E 180 MG (400 UNITS) capsule Take 400 Units by mouth daily.     zinc gluconate 50 MG tablet Take by mouth.     No current facility-administered medications for this visit.    Drug-Drug Interactions (DDIs) DDIs were evaluated?  Yes Significant DDIs?  No The patient was instructed to speak with their health care provider and/or the oral chemotherapy pharmacist before starting any new drug, including prescription or over the counter, natural / herbal products, or vitamins.  Supportive Care Reviewed sleep hygiene habits and discussed potentially doing a trial of melatonin MyChart message sent to hold vitamin D supplementation until next visit with Dr. Lindi Adie due to elevated calcium levels again.  Dosing Assessment Hepatic adjustments needed?  No Renal adjustments needed?  No Toxicity adjustments needed?  No The current dosing regimen is appropriate to continue at this time.  Follow-Up Plan Continue abemaciclib 150 mg by mouth every 12 hours Continue goserelin 3.6 mg subcutaneously every 28 days which will be administered today..  Ms. Roscher is considering a bilateral salpingo-oophorectomy Continue letrozole 2.5 mg by mouth daily Hold vitamin D supplementation due to elevated calcium levels.  To be reassessed in 2 months at her next visit with Dr. Lindi Adie. DEXA scan scheduled for 04/17/22 at [facility] per patient  request Visit note to be forwarded to Dr. Lindi Adie per patient request to review if any additional imaging is needed from her CT CAP on 03/03/22 based on radiology's recommendations Goserelin injection appointments to be moved 1 week into the future starting with next  appointment on 04/03/22 being moved to 04/10/22 which is 4 weeks from today. Labs, Dr. Lindi Adie visit, in 8 weeks Labs, pharmacy clinic visit, in 10 weeks  Shannon Hale participated in the discussion, expressed understanding, and voiced agreement with the above plan. All questions were answered to her satisfaction. The patient was advised to contact the clinic at [phone] with any questions or concerns prior to her return visit.   I spent 30 minutes assessing and educating the patient.  Raina Mina, RPH-CPP, 03/13/2022  6:32 PM   **Disclaimer: This note was dictated with voice recognition software. Similar sounding words can inadvertently be transcribed and this note may contain transcription errors which may not have been corrected upon publication of note.**

## 2022-03-21 ENCOUNTER — Other Ambulatory Visit (HOSPITAL_COMMUNITY): Payer: Self-pay

## 2022-03-21 ENCOUNTER — Other Ambulatory Visit: Payer: Self-pay | Admitting: Hematology and Oncology

## 2022-03-21 MED ORDER — ABEMACICLIB 150 MG PO TABS
150.0000 mg | ORAL_TABLET | Freq: Two times a day (BID) | ORAL | 3 refills | Status: DC
  Filled 2022-03-21: qty 56, 28d supply, fill #0
  Filled 2022-04-22: qty 56, 28d supply, fill #1
  Filled 2022-05-16: qty 56, 28d supply, fill #2
  Filled 2022-06-13: qty 56, 28d supply, fill #3

## 2022-03-21 NOTE — Telephone Encounter (Signed)
Hi Shannon Hale.  Can you please review and refill if needed.

## 2022-03-28 ENCOUNTER — Other Ambulatory Visit (HOSPITAL_COMMUNITY): Payer: Self-pay

## 2022-04-03 ENCOUNTER — Ambulatory Visit: Payer: 59

## 2022-04-10 ENCOUNTER — Inpatient Hospital Stay: Payer: 59 | Attending: Hematology and Oncology

## 2022-04-10 ENCOUNTER — Other Ambulatory Visit: Payer: Self-pay

## 2022-04-10 VITALS — BP 124/83 | HR 75 | Resp 18

## 2022-04-10 DIAGNOSIS — Z5111 Encounter for antineoplastic chemotherapy: Secondary | ICD-10-CM | POA: Diagnosis not present

## 2022-04-10 DIAGNOSIS — Z79899 Other long term (current) drug therapy: Secondary | ICD-10-CM | POA: Insufficient documentation

## 2022-04-10 DIAGNOSIS — C50811 Malignant neoplasm of overlapping sites of right female breast: Secondary | ICD-10-CM | POA: Diagnosis not present

## 2022-04-10 DIAGNOSIS — C50112 Malignant neoplasm of central portion of left female breast: Secondary | ICD-10-CM | POA: Diagnosis not present

## 2022-04-10 DIAGNOSIS — Z170 Estrogen receptor positive status [ER+]: Secondary | ICD-10-CM

## 2022-04-10 MED ORDER — GOSERELIN ACETATE 3.6 MG ~~LOC~~ IMPL
3.6000 mg | DRUG_IMPLANT | Freq: Once | SUBCUTANEOUS | Status: AC
  Administered 2022-04-10: 3.6 mg via SUBCUTANEOUS
  Filled 2022-04-10: qty 3.6

## 2022-04-17 ENCOUNTER — Other Ambulatory Visit: Payer: 59

## 2022-04-18 ENCOUNTER — Other Ambulatory Visit (HOSPITAL_COMMUNITY): Payer: Self-pay

## 2022-04-22 ENCOUNTER — Other Ambulatory Visit (HOSPITAL_COMMUNITY): Payer: Self-pay

## 2022-04-25 ENCOUNTER — Other Ambulatory Visit: Payer: Self-pay

## 2022-04-26 ENCOUNTER — Other Ambulatory Visit (HOSPITAL_COMMUNITY): Payer: Self-pay

## 2022-04-29 ENCOUNTER — Ambulatory Visit: Payer: 59 | Attending: General Surgery

## 2022-04-29 VITALS — Wt 217.5 lb

## 2022-04-29 DIAGNOSIS — Z483 Aftercare following surgery for neoplasm: Secondary | ICD-10-CM | POA: Insufficient documentation

## 2022-04-29 NOTE — Therapy (Signed)
OUTPATIENT PHYSICAL THERAPY SOZO SCREENING NOTE   Patient Name: Shannon Hale MRN: [id number] DOB:08/12/1981, 41 y.o., female Today's Date: 04/29/2022  PCP: Darreld Mclean, MD REFERRING PROVIDER: Rolm Bookbinder, MD   PT End of Session - 04/29/22 1647     Visit Number 4   # unchanged due to screen only   PT Start Time 2330    PT Stop Time 1650    PT Time Calculation (min) 6 min    Activity Tolerance Patient tolerated treatment well    Behavior During Therapy [facility] for tasks assessed/performed             Past Medical History:  Diagnosis Date   Abnormal Pap smear    Headache(784.0)    migraines   Hepatitis 03/2004   unknown form of hepatitis, no current liver problems   History of chicken pox    History of D&C    D&E   Migraine    Nausea    PONV (postoperative nausea and vomiting)    Vaginal Pap smear, abnormal    Past Surgical History:  Procedure Laterality Date   BREAST RECONSTRUCTION Bilateral 06/06/2021   Procedure: BILATERAL BREAST RECONSTRUCTION WITH TISSUE REARRANGEMENT;  Surgeon: Wallace Going, DO;  Location: [place];  Service: Plastics;  Laterality: Bilateral;   CHOLECYSTECTOMY N/A 12/01/2013   Procedure: LAPAROSCOPIC CHOLECYSTECTOMY ;  Surgeon: Harl Bowie, MD;  Location: [facility];  Service: General;  Laterality: N/A;   DILATION AND EVACUATION N/A 07/14/2014   Procedure: DILATATION AND EVACUATION;  Surgeon: Marylynn Pearson, MD;  Location: [place] [facility];  Service: Gynecology;  Laterality: N/A;   MASTECTOMY W/ SENTINEL NODE BIOPSY Right 06/06/2021   Procedure: RIGHT MASTECTOMY WITH AXILLARY SENTINEL LYMPH NODE BIOPSY;  Surgeon: Rolm Bookbinder, MD;  Location: [place];  Service: General;  Laterality: Right;   RADIOACTIVE SEED GUIDED AXILLARY SENTINEL LYMPH NODE Right 06/06/2021   Procedure: RADIOACTIVE SEED GUIDED RIGHT AXILLARY SENTINEL LYMPH NODE EXCISION;  Surgeon: Rolm Bookbinder, MD;  Location: [place];  Service: General;  Laterality: Right;   SEPTOPLASTY     TONSILLECTOMY     TOTAL MASTECTOMY Left 06/06/2021   Procedure: LEFT TOTAL MASTECTOMY;  Surgeon: Rolm Bookbinder, MD;  Location: [place];  Service: General;  Laterality: Left;   [place] EXTRACTION     Patient Active Problem List   Diagnosis Date Noted   S/P bilateral mastectomy 06/06/2021   Breast cancer ([place]) 03/27/2021   Carcinoma of overlapping sites of right breast in female, estrogen receptor positive ([place]) 03/22/2021   Headache 06/26/2020   Rash 06/26/2020   New daily persistent headache 05/21/2018   Disorder of ligament of left hand 07/20/2015   Obesity 09/25/2014   Dermatitis 12/31/2013   Cholelithiasis 02/29/2012   BACK PAIN, THORACIC REGION 11/19/2010   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 Bilateral mastectomy with no reconstruction 06/06/21. No chemotherapy. Still thinking about radiation.  4/4 nodes positive on the Rt.  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  She was told in the past that she has "scapular rotation" on the right   PRECAUTIONS: right UE Lymphedema risk, None  SUBJECTIVE: Pt returns for her 3 month L-Dex screen.   PAIN:  Are you having pain?  No  SOZO SCREENING: Patient was assessed today using the SOZO machine to determine the lymphedema index score. This was compared to her baseline score. It was determined that she is within the recommended range when compared to her baseline and no further action is needed at this time. She will continue SOZO screenings. These are done every 3 months for 2 years post operatively followed by every 6 months for 2 years, and then annually.   L-DEX FLOWSHEETS - 04/29/22 1600        L-DEX LYMPHEDEMA SCREENING   Measurement Type Unilateral    L-DEX MEASUREMENT EXTREMITY Upper Extremity    POSITION  Standing    DOMINANT SIDE Right    At Risk Side Right    BASELINE SCORE (UNILATERAL) -3.3    L-DEX SCORE (UNILATERAL) -2.8    VALUE CHANGE (UNILAT) 0.5              Otelia Limes, PTA 04/29/2022, 4:51 PM

## 2022-05-01 ENCOUNTER — Ambulatory Visit: Payer: 59

## 2022-05-09 NOTE — Progress Notes (Signed)
Patient Care Team: Copland, Gay Filler, MD as PCP - General (Family Medicine) Theodore Demark, RN (Inactive) as Oncology Nurse Navigator Rockwell Germany, RN as Oncology Nurse Navigator Mauro Kaufmann, RN as Oncology Nurse Navigator Nicholas Lose, MD as Medical Oncologist (Hematology and Oncology)  DIAGNOSIS: No diagnosis found.  SUMMARY OF ONCOLOGIC HISTORY: Oncology History  Carcinoma of overlapping sites of right breast in female, estrogen receptor positive ([place])  03/14/2021 Initial Diagnosis   Patient palpated a lump in the retroareolar right breast with nipple retraction.  Mammogram revealed right breast mass 2.6 cm and a single right axillary lymph node measuring 1.2 cm.  (PET/CT negative) breast MRI revealed 4.2 cm right retroareolar mass with non-mass enhancement total measuring 14 cm, intraparenchymal lymph node 1.1 cm.  2 abnormal right axillary lymph nodes, biopsy revealed grade 2 IDC with DCIS ER 95%, PR 95%, Ki-67 20% focally 50%, HER2 negative, lymph node positive   04/10/2021 Cancer Staging   Staging form: Breast, AJCC 8th Edition - Clinical: Stage IIA (cT2, cN1, cM0, G2, ER+, PR+, HER2-) - Signed by Nicholas Lose, MD on 04/10/2021 Stage prefix: Initial diagnosis Histologic grading system: 3 grade system   04/24/2021 -  Anti-estrogen oral therapy   Tamoxifen 10 mg daily started neoadjuvantly because of slight delay in surgery   06/06/2021 Surgery   Bilateral mastectomies 06/06/2021: [facility]: Left mastectomy: Grade 2 IDC 0.3 cm, margins negative Right mastectomy: Grade 3 IDC with DCIS 7.5 cm, margins negative, 4/4 lymph nodes positive ER 95%, PR 95%, HER2 1+, Ki-67 20% focally 50%   09/02/2021 -  Anti-estrogen oral therapy   Verzinio with Zoladex and Letrozole     CHIEF COMPLIANT: Follow-up of right breast cancer    INTERVAL HISTORY: Shannon Hale is a 41 y.o. with above-mentioned history of right breast cancer having undergone bilateral mastectomies. She presents to  the clinic today for follow-up.       ALLERGIES:  has No Known Allergies.  MEDICATIONS:  Current Outpatient Medications  Medication Sig Dispense Refill   abemaciclib (VERZENIO) 150 MG tablet Take 1 tablet (150 mg total) by mouth 2 (two) times daily. 56 tablet 3   Acetylcysteine (N-ACETYL-L-CYSTEINE) 600 MG CAPS Take 600 mg by mouth. (Patient not taking: Reported on 10/17/2021)     AMBULATORY NON FORMULARY MEDICATION Eg Cg Boost supplement (Patient not taking: Reported on 10/17/2021)     APPLE CIDER VINEGAR PO Take 450 mg by mouth. (Patient not taking: Reported on 10/17/2021)     ascorbic acid (VITAMIN C) 1000 MG tablet Take by mouth.     Cholecalciferol (VITAMIN D-3) 125 MCG (5000 UT) TABS Take 125 mcg by mouth.     l-methylfolate-B6-B12 (METANX) 3-35-2 MG TABS tablet Take 1 tablet by mouth daily.     letrozole (FEMARA) 2.5 MG tablet Take 1 tablet (2.5 mg total) by mouth daily. 90 tablet 3   methocarbamol (ROBAXIN) 500 MG tablet Take 1 tablet (500 mg total) by mouth 2 (two) times daily as needed for muscle spasms. (Patient not taking: Reported on 10/17/2021) 14 tablet 0   NON FORMULARY  (Patient not taking: Reported on 10/17/2021)     ondansetron (ZOFRAN) 4 MG tablet Take 1 tablet (4 mg total) by mouth every 8 (eight) hours as needed for nausea or vomiting. (Patient not taking: Reported on 10/17/2021) 30 tablet 2   Rimegepant Sulfate (NURTEC) 75 MG TBDP Use one tablet daily as needed for migraine (Patient not taking: Reported on 10/17/2021) 5 tablet 1  Turmeric 450 MG CAPS Take by mouth.     vitamin E 180 MG (400 UNITS) capsule Take 400 Units by mouth daily.     zinc gluconate 50 MG tablet Take by mouth.     No current facility-administered medications for this visit.    PHYSICAL EXAMINATION: ECOG PERFORMANCE STATUS: {CHL ONC ECOG [phone]}  There were no vitals filed for this visit. There were no vitals filed for this visit.  BREAST:*** No palpable masses or nodules in either right  or left breasts. No palpable axillary supraclavicular or infraclavicular adenopathy no breast tenderness or nipple discharge. (exam performed in the presence of a chaperone)  LABORATORY DATA:  I have reviewed the data as listed    Latest Ref Rng & Units 03/13/2022    2:27 PM 01/15/2022    2:56 PM 12/18/2021    1:41 PM  CMP  Glucose 70 - 99 mg/dL 95  110  88   BUN 6 - 20 mg/dL '12  13  7   ' Creatinine 0.44 - 1.00 mg/dL 1.02  1.16  0.98   Sodium 135 - 145 mmol/L 141  139  138   Potassium 3.5 - 5.1 mmol/L 4.2  4.1  3.7   Chloride 98 - 111 mmol/L 106  104  105   CO2 22 - 32 mmol/L '26  29  26   ' Calcium 8.9 - 10.3 mg/dL 10.6  10.0  9.6   Total Protein 6.5 - 8.1 g/dL 8.7  8.2  7.8   Total Bilirubin 0.3 - 1.2 mg/dL 0.3  0.6  0.5   Alkaline Phos 38 - 126 U/L 78  90  67   AST 15 - 41 U/L '18  22  16   ' ALT 0 - 44 U/L '18  21  10     ' Lab Results  Component Value Date   WBC 6.0 03/13/2022   HGB 13.0 03/13/2022   HCT 37.5 03/13/2022   MCV 86.8 03/13/2022   PLT 333 03/13/2022   NEUTROABS 3.6 03/13/2022    ASSESSMENT & PLAN:  No problem-specific Assessment & Plan notes found for this encounter.    No orders of the defined types were placed in this encounter.  The patient has a good understanding of the overall plan. she agrees with it. she will call with any problems that may develop before the next visit here. Total time spent: 30 mins including face to face time and time spent for planning, charting and co-ordination of care   Suzzette Righter, [place] 05/09/22    I Gardiner Coins am scribing for Dr. Lindi Adie  ***

## 2022-05-13 ENCOUNTER — Inpatient Hospital Stay: Payer: 59 | Attending: Hematology and Oncology

## 2022-05-13 ENCOUNTER — Inpatient Hospital Stay: Payer: 59

## 2022-05-13 ENCOUNTER — Other Ambulatory Visit: Payer: Self-pay

## 2022-05-13 ENCOUNTER — Inpatient Hospital Stay: Payer: 59 | Admitting: Hematology and Oncology

## 2022-05-13 DIAGNOSIS — Z170 Estrogen receptor positive status [ER+]: Secondary | ICD-10-CM | POA: Diagnosis not present

## 2022-05-13 DIAGNOSIS — C50811 Malignant neoplasm of overlapping sites of right female breast: Secondary | ICD-10-CM

## 2022-05-13 DIAGNOSIS — C50112 Malignant neoplasm of central portion of left female breast: Secondary | ICD-10-CM | POA: Diagnosis not present

## 2022-05-13 DIAGNOSIS — G43009 Migraine without aura, not intractable, without status migrainosus: Secondary | ICD-10-CM

## 2022-05-13 DIAGNOSIS — Z5111 Encounter for antineoplastic chemotherapy: Secondary | ICD-10-CM | POA: Diagnosis not present

## 2022-05-13 DIAGNOSIS — Z79899 Other long term (current) drug therapy: Secondary | ICD-10-CM | POA: Diagnosis not present

## 2022-05-13 LAB — CBC WITH DIFFERENTIAL (CANCER CENTER ONLY)
Abs Immature Granulocytes: 0.02 10*3/uL (ref 0.00–0.07)
Basophils Absolute: 0.1 10*3/uL (ref 0.0–0.1)
Basophils Relative: 1 %
Eosinophils Absolute: 0.1 10*3/uL (ref 0.0–0.5)
Eosinophils Relative: 2 %
HCT: 36 % (ref 36.0–46.0)
Hemoglobin: 12.8 g/dL (ref 12.0–15.0)
Immature Granulocytes: 0 %
Lymphocytes Relative: 37 %
Lymphs Abs: 2.1 10*3/uL (ref 0.7–4.0)
MCH: 30.3 pg (ref 26.0–34.0)
MCHC: 35.6 g/dL (ref 30.0–36.0)
MCV: 85.1 fL (ref 80.0–100.0)
Monocytes Absolute: 0.4 10*3/uL (ref 0.1–1.0)
Monocytes Relative: 7 %
Neutro Abs: 2.9 10*3/uL (ref 1.7–7.7)
Neutrophils Relative %: 53 %
Platelet Count: 292 10*3/uL (ref 150–400)
RBC: 4.23 MIL/uL (ref 3.87–5.11)
RDW: 12.2 % (ref 11.5–15.5)
WBC Count: 5.5 10*3/uL (ref 4.0–10.5)
nRBC: 0 % (ref 0.0–0.2)

## 2022-05-13 LAB — CMP (CANCER CENTER ONLY)
ALT: 11 U/L (ref 0–44)
AST: 17 U/L (ref 15–41)
Albumin: 4.7 g/dL (ref 3.5–5.0)
Alkaline Phosphatase: 75 U/L (ref 38–126)
Anion gap: 6 (ref 5–15)
BUN: 9 mg/dL (ref 6–20)
CO2: 27 mmol/L (ref 22–32)
Calcium: 10.3 mg/dL (ref 8.9–10.3)
Chloride: 103 mmol/L (ref 98–111)
Creatinine: 1.03 mg/dL — ABNORMAL HIGH (ref 0.44–1.00)
GFR, Estimated: >60 mL/min (ref >60)
Glucose, Bld: 105 mg/dL — ABNORMAL HIGH (ref 70–99)
Potassium: 3.7 mmol/L (ref 3.5–5.1)
Sodium: 136 mmol/L (ref 135–145)
Total Bilirubin: 0.4 mg/dL (ref 0.3–1.2)
Total Protein: 8.1 g/dL (ref 6.5–8.1)

## 2022-05-13 MED ORDER — GOSERELIN ACETATE 3.6 MG ~~LOC~~ IMPL
3.6000 mg | DRUG_IMPLANT | Freq: Once | SUBCUTANEOUS | Status: AC
  Administered 2022-05-13: 3.6 mg via SUBCUTANEOUS
  Filled 2022-05-13: qty 3.6

## 2022-05-13 MED ORDER — ONDANSETRON HCL 4 MG PO TABS
4.0000 mg | ORAL_TABLET | Freq: Three times a day (TID) | ORAL | 6 refills | Status: AC | PRN
  Filled 2022-05-13: qty 30, 10d supply, fill #0
  Filled 2022-07-04: qty 30, 10d supply, fill #1
  Filled 2022-12-18: qty 30, 10d supply, fill #2
  Filled 2023-02-17: qty 30, 10d supply, fill #0

## 2022-05-13 NOTE — Assessment & Plan Note (Signed)
03/14/2021:Patient palpated a lump in the retroareolar right breast with nipple retraction. Mammogram revealed right breast mass 2.6 cm and a single right axillary lymph node measuring 1.2 cm. (PET/CT negative) breast MRI revealed 4.2 cm right retroareolar mass with non-mass enhancement total measuring 14 cm, intraparenchymal lymph node 1.1 cm. 2 abnormal right axillary lymph nodes, biopsy revealed grade 2 IDC with DCIS ER 95%, PR 95%, Ki-67 20% focally 50%, HER2 negative, lymph node positive  PET CT scan: Negative for distant metastatic disease MammaPrint: High risk  Treatment plan: 1.Bilateral mastectomies 06/06/2021: [facility]: Left mastectomy: Grade 2 IDC 0.3 cm, margins negative Right mastectomy: Grade 3 IDC with DCIS 7.5 cm, margins negative, 4/4 lymph nodes positive ER 95%, PR 95%, HER2 1+, Ki-67 20% focally 50% 2.adjuvant chemotherapywith dose dense Adriamycin and Cytoxan x4 followed by Taxol x12(patient refused chemo and radiation) 3.Adjuvant antiestrogen therapy with AI plus ovarian suppression plus or minus abemaciclib versus tamoxifen with or without abemaciclib ------------------------------------------------------------------------------------------------------------------------------------ Current treatment: Zoladex with letrozole and abemaciclib started 08/22/2022  Abemaciclibtoxicities: Current dosing: 150 mg p.o. twice daily No significant diarrhea Tolerating it extremely well.  She comes monthly for injections.  Abdominal pain: CT CAP 03/03/22: No evidence of Met disease, 1.1 cm seroma rt Axilla RTC in 3 months

## 2022-05-16 ENCOUNTER — Other Ambulatory Visit (HOSPITAL_COMMUNITY): Payer: Self-pay

## 2022-05-21 ENCOUNTER — Encounter: Payer: Self-pay | Admitting: Radiology

## 2022-05-21 ENCOUNTER — Ambulatory Visit
Admission: RE | Admit: 2022-05-21 | Discharge: 2022-05-21 | Disposition: A | Discharge status: Home / Self Care | Payer: 59 | Source: Ambulatory Visit | Attending: Hematology and Oncology | Admitting: Hematology and Oncology

## 2022-05-21 DIAGNOSIS — C50811 Malignant neoplasm of overlapping sites of right female breast: Secondary | ICD-10-CM | POA: Insufficient documentation

## 2022-05-21 DIAGNOSIS — Z13820 Encounter for screening for osteoporosis: Secondary | ICD-10-CM | POA: Diagnosis not present

## 2022-05-21 DIAGNOSIS — Z170 Estrogen receptor positive status [ER+]: Secondary | ICD-10-CM | POA: Diagnosis not present

## 2022-05-22 ENCOUNTER — Other Ambulatory Visit (HOSPITAL_COMMUNITY): Payer: Self-pay

## 2022-05-29 ENCOUNTER — Ambulatory Visit: Payer: 59

## 2022-06-03 ENCOUNTER — Other Ambulatory Visit: Payer: Self-pay | Admitting: *Deleted

## 2022-06-04 ENCOUNTER — Encounter: Payer: Self-pay | Admitting: General Surgery

## 2022-06-10 ENCOUNTER — Other Ambulatory Visit: Payer: Self-pay

## 2022-06-10 ENCOUNTER — Inpatient Hospital Stay: Payer: 59 | Attending: Hematology and Oncology

## 2022-06-10 DIAGNOSIS — C50811 Malignant neoplasm of overlapping sites of right female breast: Secondary | ICD-10-CM | POA: Insufficient documentation

## 2022-06-10 DIAGNOSIS — Z5111 Encounter for antineoplastic chemotherapy: Secondary | ICD-10-CM | POA: Diagnosis not present

## 2022-06-10 DIAGNOSIS — Z79899 Other long term (current) drug therapy: Secondary | ICD-10-CM | POA: Insufficient documentation

## 2022-06-10 DIAGNOSIS — C50112 Malignant neoplasm of central portion of left female breast: Secondary | ICD-10-CM | POA: Diagnosis not present

## 2022-06-10 MED ORDER — GOSERELIN ACETATE 3.6 MG ~~LOC~~ IMPL
3.6000 mg | DRUG_IMPLANT | Freq: Once | SUBCUTANEOUS | Status: AC
  Administered 2022-06-10: 3.6 mg via SUBCUTANEOUS
  Filled 2022-06-10: qty 3.6

## 2022-06-12 ENCOUNTER — Other Ambulatory Visit (HOSPITAL_COMMUNITY): Payer: Self-pay

## 2022-06-13 ENCOUNTER — Other Ambulatory Visit (HOSPITAL_COMMUNITY): Payer: Self-pay

## 2022-06-18 ENCOUNTER — Other Ambulatory Visit (HOSPITAL_COMMUNITY): Payer: Self-pay

## 2022-06-21 ENCOUNTER — Encounter: Payer: Self-pay | Admitting: Hematology and Oncology

## 2022-06-21 ENCOUNTER — Other Ambulatory Visit (HOSPITAL_BASED_OUTPATIENT_CLINIC_OR_DEPARTMENT_OTHER): Payer: Self-pay

## 2022-06-21 MED ORDER — FLUARIX QUADRIVALENT 0.5 ML IM SUSY
PREFILLED_SYRINGE | INTRAMUSCULAR | 0 refills | Status: DC
  Filled 2022-06-21: qty 0.5, 1d supply, fill #0

## 2022-06-26 ENCOUNTER — Ambulatory Visit: Payer: 59

## 2022-07-01 ENCOUNTER — Inpatient Hospital Stay: Payer: 59

## 2022-07-01 ENCOUNTER — Inpatient Hospital Stay: Payer: 59 | Admitting: Pharmacist

## 2022-07-04 ENCOUNTER — Other Ambulatory Visit: Payer: Self-pay

## 2022-07-09 ENCOUNTER — Other Ambulatory Visit (HOSPITAL_COMMUNITY): Payer: Self-pay

## 2022-07-11 ENCOUNTER — Inpatient Hospital Stay: Payer: 59 | Admitting: Pharmacist

## 2022-07-11 ENCOUNTER — Inpatient Hospital Stay: Payer: 59

## 2022-07-11 ENCOUNTER — Other Ambulatory Visit (HOSPITAL_COMMUNITY): Payer: Self-pay

## 2022-07-15 ENCOUNTER — Other Ambulatory Visit (HOSPITAL_COMMUNITY): Payer: Self-pay

## 2022-07-15 ENCOUNTER — Other Ambulatory Visit: Payer: Self-pay | Admitting: Hematology and Oncology

## 2022-07-15 MED ORDER — ABEMACICLIB 150 MG PO TABS
150.0000 mg | ORAL_TABLET | Freq: Two times a day (BID) | ORAL | 3 refills | Status: DC
  Filled 2022-07-15: qty 56, 28d supply, fill #0
  Filled 2022-08-07: qty 56, 28d supply, fill #1
  Filled 2022-08-29: qty 56, 28d supply, fill #2
  Filled 2022-09-26: qty 56, 28d supply, fill #3

## 2022-07-15 NOTE — Telephone Encounter (Signed)
Shannon Hale, can you please review and refill if needed. Thanks

## 2022-07-18 ENCOUNTER — Other Ambulatory Visit (HOSPITAL_COMMUNITY): Payer: Self-pay

## 2022-07-23 ENCOUNTER — Other Ambulatory Visit: Payer: Self-pay

## 2022-07-24 ENCOUNTER — Ambulatory Visit: Payer: 59

## 2022-07-24 ENCOUNTER — Inpatient Hospital Stay: Payer: 59 | Admitting: Pharmacist

## 2022-07-24 ENCOUNTER — Inpatient Hospital Stay: Payer: 59

## 2022-07-24 ENCOUNTER — Other Ambulatory Visit: Payer: Self-pay

## 2022-07-24 ENCOUNTER — Inpatient Hospital Stay: Payer: 59 | Attending: Hematology and Oncology

## 2022-07-24 VITALS — BP 120/66 | HR 73 | Temp 98.1°F | Resp 16 | Ht 69.0 in | Wt 217.7 lb

## 2022-07-24 DIAGNOSIS — Z170 Estrogen receptor positive status [ER+]: Secondary | ICD-10-CM

## 2022-07-24 DIAGNOSIS — Z5111 Encounter for antineoplastic chemotherapy: Secondary | ICD-10-CM | POA: Insufficient documentation

## 2022-07-24 DIAGNOSIS — Z79899 Other long term (current) drug therapy: Secondary | ICD-10-CM | POA: Diagnosis not present

## 2022-07-24 DIAGNOSIS — C50112 Malignant neoplasm of central portion of left female breast: Secondary | ICD-10-CM | POA: Diagnosis not present

## 2022-07-24 DIAGNOSIS — C50811 Malignant neoplasm of overlapping sites of right female breast: Secondary | ICD-10-CM | POA: Diagnosis not present

## 2022-07-24 LAB — CMP (CANCER CENTER ONLY)
ALT: 20 U/L (ref 0–44)
AST: 21 U/L (ref 15–41)
Albumin: 5.1 g/dL — ABNORMAL HIGH (ref 3.5–5.0)
Alkaline Phosphatase: 104 U/L (ref 38–126)
Anion gap: 7 (ref 5–15)
BUN: 11 mg/dL (ref 6–20)
CO2: 29 mmol/L (ref 22–32)
Calcium: 10.5 mg/dL — ABNORMAL HIGH (ref 8.9–10.3)
Chloride: 104 mmol/L (ref 98–111)
Creatinine: 1.13 mg/dL — ABNORMAL HIGH (ref 0.44–1.00)
GFR, Estimated: >60 mL/min (ref >60)
Glucose, Bld: 95 mg/dL (ref 70–99)
Potassium: 4 mmol/L (ref 3.5–5.1)
Sodium: 140 mmol/L (ref 135–145)
Total Bilirubin: 0.4 mg/dL (ref 0.3–1.2)
Total Protein: 8.4 g/dL — ABNORMAL HIGH (ref 6.5–8.1)

## 2022-07-24 LAB — CBC WITH DIFFERENTIAL (CANCER CENTER ONLY)
Abs Immature Granulocytes: 0.03 10*3/uL (ref 0.00–0.07)
Basophils Absolute: 0.1 10*3/uL (ref 0.0–0.1)
Basophils Relative: 1 %
Eosinophils Absolute: 0.1 10*3/uL (ref 0.0–0.5)
Eosinophils Relative: 2 %
HCT: 37.1 % (ref 36.0–46.0)
Hemoglobin: 13 g/dL (ref 12.0–15.0)
Immature Granulocytes: 1 %
Lymphocytes Relative: 34 %
Lymphs Abs: 1.9 10*3/uL (ref 0.7–4.0)
MCH: 30.3 pg (ref 26.0–34.0)
MCHC: 35 g/dL (ref 30.0–36.0)
MCV: 86.5 fL (ref 80.0–100.0)
Monocytes Absolute: 0.4 10*3/uL (ref 0.1–1.0)
Monocytes Relative: 7 %
Neutro Abs: 3.2 10*3/uL (ref 1.7–7.7)
Neutrophils Relative %: 55 %
Platelet Count: 341 10*3/uL (ref 150–400)
RBC: 4.29 MIL/uL (ref 3.87–5.11)
RDW: 12.4 % (ref 11.5–15.5)
WBC Count: 5.7 10*3/uL (ref 4.0–10.5)
nRBC: 0 % (ref 0.0–0.2)

## 2022-07-24 MED ORDER — GOSERELIN ACETATE 3.6 MG ~~LOC~~ IMPL
3.6000 mg | DRUG_IMPLANT | Freq: Once | SUBCUTANEOUS | Status: AC
  Administered 2022-07-24: 3.6 mg via SUBCUTANEOUS
  Filled 2022-07-24: qty 3.6

## 2022-07-24 NOTE — Progress Notes (Signed)
[place]       Telephone: [phone]?Fax: [phone]   Oncology Clinical Pharmacist Practitioner Progress Note  Expand All Collapse All [place]       Telephone: [phone]?Fax: [phone]    Oncology Clinical Pharmacist Practitioner Progress Note   Shannon Hale was contacted via in-person to discuss her chemotherapy regimen for abemaciclib which they receive under the care of Dr. Nicholas Hale.   Current treatment regimen and start date Abemaciclib (09/03/21) Goserelin (08/22/21) Letrozole (08/02/21)   Interval History She continues on abemaciclib 150 mg by mouth every 12 hours on days 1 to 28 of a 28-day cycle. This is being given  in combination with letrozole and goserelin . Therapy is planned to continue until  2 years in the adjuvant setting per the monarchE trial data. She last saw Dr. Lindi Hale on 05/13/22 and clinical pharmacy on 03/13/22.  Her last visit with Dr. Lindi Hale, her calcium had normalized.  Today it is slightly elevated again and she will continue to hold off on calcium or vitamin D supplements.      Response to Therapy Overall, Shannon Hale is tolerating abemaciclib well.  She states that she is sleeping better and some of the stress that she was experiencing at her last visit has resolved.  She does report some work-related stress but her 2 main concerns today are joint pain and reflux.  She had a great trip to [place] but has noticed recently that her finger and toes seem to be very stiff and somewhat painful.  This is likely multifactorial but may be due to the letrozole which she has been on since 08/22/21.  She also reports stopping tumeric approximately 6 months ago.  We have discussed that turmeric appears to be safe when compared to her active medication list.  She has also used over-the-counter ibuprofen with some relief.  She is not interested in switching aromatase inhibitors currently but we did discuss that this  could be an option if other recommendations are not successful.  She is also having some acid reflux which has made her nauseous at times.  She has taken ondansetron which has helped.  We did discuss that she could consider taking over-the-counter famotidine (Pepcid) which she will consider.  She stated calcium carbonate (Tums) cannot be taken because it makes her gag.  We also discussed that this would likely elevate her calcium levels even further.  For the time being, she will try famotidine as needed and we discussed that if that is not successful, she could also try a proton pump inhibitor at a later time.  Have added famotidine to her medication list.  We have also removed several medications as well.  She is due for goserelin today and again in 4 weeks.  Her injection appointments did get off while she was on vacation so we will update these for her future visits.  She will tentatively see Dr. Lindi Hale again with labs hand goserelin on 09/18/21 and clinical pharmacy with labs and goserelin on 11/12/21.  She knows to continue to hold her vitamin D supplement and can contact us with any questions or concerns in the interim.  We did also recommend to increase her fluid intake as her serum creatinine was slightly elevated since her last visit.  Shannon Hale verbalized understanding of the plan. Labs, vitals, treatment parameters, and manufacturer guidelines assessing toxicity were reviewed with Shannon Hale today. Based on these values, patient is in  agreement to continue abemaciclib therapy at this time.  Allergies No Known Allergies  Vitals    07/24/2022    3:05 PM 05/13/2022    2:48 PM 04/29/2022    4:46 PM  Oncology Vitals  Height 175 cm 175 cm   Weight 98.748 kg 98.294 kg 98.657 kg  Weight (lbs) 217 lbs 11 oz 216 lbs 11 oz 217 lbs 8 oz  BMI 32.15 kg/m2   32.15 kg/m2 32 kg/m2   32 kg/m2 32.12 kg/m2   32.12 kg/m2  Temp 98.1 F (36.7 C) 97.7 F (36.5 C)   Pulse Rate 73 79   BP 120/66  136/74   Resp 16 18   SpO2 97 % 98 %   BSA (m2) 2.19 m2   2.19 m2 2.19 m2   2.19 m2 2.19 m2   2.19 m2    Laboratory Data    Latest Ref Rng & Units 07/24/2022    2:35 PM 05/13/2022    2:05 PM 03/13/2022    2:27 PM  CBC EXTENDED  WBC 4.0 - 10.5 K/uL 5.7  5.5  6.0   RBC 3.87 - 5.11 MIL/uL 4.29  4.23  4.32   Hemoglobin 12.0 - 15.0 g/dL 13.0  12.8  13.0   HCT 36.0 - 46.0 % 37.1  36.0  37.5   Platelets 150 - 400 K/uL 341  292  333   NEUT# 1.7 - 7.7 K/uL 3.2  2.9  3.6   Lymph# 0.7 - 4.0 K/uL 1.9  2.1  1.8        Latest Ref Rng & Units 07/24/2022    2:35 PM 05/13/2022    2:05 PM 03/13/2022    2:27 PM  CMP  Glucose 70 - 99 mg/dL 95  105  95   BUN 6 - 20 mg/dL '11  9  12   '$ Creatinine 0.44 - 1.00 mg/dL 1.13  1.03  1.02   Sodium 135 - 145 mmol/L 140  136  141   Potassium 3.5 - 5.1 mmol/L 4.0  3.7  4.2   Chloride 98 - 111 mmol/L 104  103  106   CO2 22 - 32 mmol/L '29  27  26   '$ Calcium 8.9 - 10.3 mg/dL 10.5  10.3  10.6   Total Protein 6.5 - 8.1 g/dL 8.4  8.1  8.7   Total Bilirubin 0.3 - 1.2 mg/dL 0.4  0.4  0.3   Alkaline Phos 38 - 126 U/L 104  75  78   AST 15 - 41 U/L '21  17  18   '$ ALT 0 - 44 U/L '20  11  18    '$ Adverse Effects Assessment Calcium elevation: She will continue to hold vitamin D supplementation.  Her calcium has fluctuated slightly and today is slightly above the upper limit of normal.  She is asymptomatic. Joint pain: May be due to letrozole.  She may restart turmeric and possibly switch to another aromatase inhibitor at a later time. Acid reflux: She may start famotidine and possibly a proton pump inhibitor if famotidine does not work for her.  Adherence Assessment Shannon Hale reports missing 3 doses over the past 8 weeks.   Reason for missed dose: Was on vacation and missed doses while out Patient was re-educated on importance of adherence.   Access Assessment Shannon Hale is currently receiving her abemaciclib through the [facility] outpatient  pharmacy Insurance concerns: None  Medication Reconciliation The patient's medication list was reviewed today with the patient?  Yes New medications or herbal supplements have recently been started?  No, but will start famotidine over-the-counter which has been added to her medication list. Any medications have been discontinued?  Yes, her medication list has been updated. The medication list was updated and reconciled based on the patient's most recent medication list in the electronic medical record (EMR) including herbal products and OTC medications.   Medications Current Outpatient Medications  Medication Sig Dispense Refill   abemaciclib (VERZENIO) 150 MG tablet Take 1 tablet (150 mg total) by mouth 2 (two) times daily. 56 tablet 3   AMBULATORY NON FORMULARY MEDICATION Eg Cg Boost supplement     ascorbic acid (VITAMIN C) 1000 MG tablet Take by mouth.     famotidine (PEPCID) 10 MG tablet 10 mg daily as needed for heartburn or indigestion.     l-methylfolate-B6-B12 (METANX) 3-35-2 MG TABS tablet Take 1 tablet by mouth daily.     letrozole (FEMARA) 2.5 MG tablet Take 1 tablet (2.5 mg total) by mouth daily. 90 tablet 3   ondansetron (ZOFRAN) 4 MG tablet Take 1 tablet (4 mg total) by mouth every 8 (eight) hours as needed for nausea or vomiting. 30 tablet 6   Turmeric 450 MG CAPS Take by mouth.     APPLE CIDER VINEGAR PO Take 450 mg by mouth. (Patient not taking: Reported on 10/17/2021)     Cholecalciferol (VITAMIN D-3) 125 MCG (5000 UT) TABS Take 125 mcg by mouth. (Patient not taking: Reported on 07/24/2022)     Rimegepant Sulfate (NURTEC) 75 MG TBDP Use one tablet daily as needed for migraine (Patient not taking: Reported on 10/17/2021) 5 tablet 1   vitamin E 180 MG (400 UNITS) capsule Take 400 Units by mouth daily.     No current facility-administered medications for this visit.    Drug-Drug Interactions (DDIs) DDIs were evaluated?  Yes Significant DDIs?  No The patient was instructed  to speak with their health care provider and/or the oral chemotherapy pharmacist before starting any new drug, including prescription or over the counter, natural / herbal products, or vitamins.  Supportive Care Diarrhea: we reviewed that diarrhea is common with abemaciclib and confirmed that she does have loperamide (Imodium) at home.  We reviewed how to take this medication PRN. Neutropenia: we discussed the importance of having a thermometer and what the Centers for Disease Control and Prevention (CDC) considers a fever which is 100.63F (38C) or higher.  Gave patient 24/7 triage line to call if any fevers or symptoms. ILD/Pneumonitis: we reviewed potential symptoms including cough, shortness, and fatigue.  VTE: reviewed signs of DVT such as leg swelling, redness, pain, or tenderness and signs of PE such as shortness of breath, rapid or irregular heartbeat, cough, chest pain, or lightheadedness.  Dosing Assessment Hepatic adjustments needed?  No Renal adjustments needed?  No Toxicity adjustments needed?  No The current dosing regimen is appropriate to continue at this time.  Follow-Up Plan Continue abemaciclib 150 mg by mouth every 12 hours.  Refill request sent on 07/15/22 Continue letrozole 2.5 mg by mouth daily.  May consider switching to another aromatase inhibitor if joint pain does not get better Continue goserelin 3.6 mg subcutaneously every 28 days.  Dose will be given today and we will move upcoming appointments to reflect every 28-day frequency Start famotidine for acid reflux.  May consider proton pump inhibitor at a later time Continue to hold vitamin D supplementation for increased calcium level.  We will continue to monitor.  Currently asymptomatic  Next goserelin injection will be moved from 08/08/22 Labs, Dr. Lindi Hale visit, goserelin injection, on 09/18/21 Will add goserelin injection the for 10/16/22 We will add labs, pharmacy clinic visit, goserelin injection, on  11/13/22  Shannon Hale participated in the discussion, expressed understanding, and voiced agreement with the above plan. All questions were answered to her satisfaction. The patient was advised to contact the clinic at [phone] with any questions or concerns prior to her return visit.   I spent 30 minutes assessing and educating the patient.  Raina Mina, RPH-CPP, 07/24/2022  4:10 PM   **Disclaimer: This note was dictated with voice recognition software. Similar sounding words can inadvertently be transcribed and this note may contain transcription errors which may not have been corrected upon publication of note.**

## 2022-07-26 ENCOUNTER — Telehealth: Payer: Self-pay | Admitting: Pharmacist

## 2022-07-26 NOTE — Telephone Encounter (Signed)
Scheduled appointment per 11/22 los. Left voicemail.

## 2022-07-29 ENCOUNTER — Other Ambulatory Visit (HOSPITAL_COMMUNITY): Payer: Self-pay

## 2022-07-30 ENCOUNTER — Telehealth: Payer: Self-pay | Admitting: Pharmacy Technician

## 2022-07-30 ENCOUNTER — Other Ambulatory Visit (HOSPITAL_COMMUNITY): Payer: Self-pay

## 2022-07-30 NOTE — Telephone Encounter (Signed)
Oral Oncology Patient Advocate Encounter   Received notification that prior authorization for Verzenio is due for renewal.   PA submitted on 07/30/22 Key [id number] Status is pending     [name], CPhT-Adv Oncology Pharmacy Patient [place] Direct Number: [phone]  Fax: [phone]

## 2022-07-30 NOTE — Telephone Encounter (Signed)
Oral Oncology Patient Advocate Encounter  Prior Authorization for Shannon Hale has been approved.    PA# [id number] Effective dates: 07/30/22 through 07/30/23  Patient may continue to fill at [facility].    [name], CPhT-Adv Oncology Pharmacy Patient [place] Direct Number: [phone]  Fax: [phone]

## 2022-08-05 DIAGNOSIS — Z01419 Encounter for gynecological examination (general) (routine) without abnormal findings: Secondary | ICD-10-CM | POA: Diagnosis not present

## 2022-08-05 DIAGNOSIS — Z1151 Encounter for screening for human papillomavirus (HPV): Secondary | ICD-10-CM | POA: Diagnosis not present

## 2022-08-05 DIAGNOSIS — Z6832 Body mass index (BMI) 32.0-32.9, adult: Secondary | ICD-10-CM | POA: Diagnosis not present

## 2022-08-05 DIAGNOSIS — Z124 Encounter for screening for malignant neoplasm of cervix: Secondary | ICD-10-CM | POA: Diagnosis not present

## 2022-08-07 ENCOUNTER — Other Ambulatory Visit (HOSPITAL_COMMUNITY): Payer: Self-pay

## 2022-08-07 LAB — HM PAP SMEAR: HPV, high-risk: NEGATIVE

## 2022-08-08 ENCOUNTER — Inpatient Hospital Stay: Payer: 59

## 2022-08-12 ENCOUNTER — Ambulatory Visit: Payer: Commercial Managed Care - PPO | Attending: General Surgery

## 2022-08-12 VITALS — Wt 218.5 lb

## 2022-08-12 DIAGNOSIS — Z483 Aftercare following surgery for neoplasm: Secondary | ICD-10-CM | POA: Insufficient documentation

## 2022-08-12 NOTE — Therapy (Signed)
OUTPATIENT PHYSICAL THERAPY SOZO SCREENING NOTE   Patient Name: Shannon Hale MRN: [id number] DOB:September 21, 1980, 41 y.o., female Today's Date: 08/12/2022  PCP: Darreld Mclean, MD REFERRING PROVIDER: Rolm Bookbinder, MD   PT End of Session - 08/12/22 1650     Visit Number 4   # unchanged due to screen only   PT Start Time 3976    PT Stop Time 1652    PT Time Calculation (min) 5 min    Activity Tolerance Patient tolerated treatment well    Behavior During Therapy [facility] for tasks assessed/performed             Past Medical History:  Diagnosis Date   Abnormal Pap smear    Headache(784.0)    migraines   Hepatitis 03/2004   unknown form of hepatitis, no current liver problems   History of chicken pox    History of D&C    D&E   Migraine    Nausea    PONV (postoperative nausea and vomiting)    Vaginal Pap smear, abnormal    Past Surgical History:  Procedure Laterality Date   BREAST RECONSTRUCTION Bilateral 06/06/2021   Procedure: BILATERAL BREAST RECONSTRUCTION WITH TISSUE REARRANGEMENT;  Surgeon: Wallace Going, DO;  Location: [facility];  Service: Plastics;  Laterality: Bilateral;   CHOLECYSTECTOMY N/A 12/01/2013   Procedure: LAPAROSCOPIC CHOLECYSTECTOMY ;  Surgeon: Harl Bowie, MD;  Location: [facility];  Service: General;  Laterality: N/A;   DILATION AND EVACUATION N/A 07/14/2014   Procedure: DILATATION AND EVACUATION;  Surgeon: Marylynn Pearson, MD;  Location: [facility];  Service: Gynecology;  Laterality: N/A;   MASTECTOMY W/ SENTINEL NODE BIOPSY Right 06/06/2021   Procedure: RIGHT MASTECTOMY WITH AXILLARY SENTINEL LYMPH NODE BIOPSY;  Surgeon: Rolm Bookbinder, MD;  Location: [facility];  Service: General;  Laterality: Right;   RADIOACTIVE SEED GUIDED AXILLARY SENTINEL LYMPH NODE Right 06/06/2021   Procedure: RADIOACTIVE SEED GUIDED RIGHT AXILLARY SENTINEL LYMPH NODE EXCISION;  Surgeon: Rolm Bookbinder, MD;  Location: [facility];  Service: General;  Laterality: Right;   SEPTOPLASTY     TONSILLECTOMY     TOTAL MASTECTOMY Left 06/06/2021   Procedure: LEFT TOTAL MASTECTOMY;  Surgeon: Rolm Bookbinder, MD;  Location: [facility];  Service: General;  Laterality: Left;   [place] EXTRACTION     Patient Active Problem List   Diagnosis Date Noted   S/P bilateral mastectomy 06/06/2021   Breast cancer ([place]) 03/27/2021   Carcinoma of overlapping sites of right breast in female, estrogen receptor positive ([place]) 03/22/2021   Headache 06/26/2020   Rash 06/26/2020   New daily persistent headache 05/21/2018   Disorder of ligament of left hand 07/20/2015   Obesity 09/25/2014   Dermatitis 12/31/2013   Cholelithiasis 02/29/2012   BACK PAIN, THORACIC REGION 11/19/2010   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 Bilateral mastectomy with no reconstruction 06/06/21. No chemotherapy. Still thinking about radiation.  4/4 nodes positive on the Rt.  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  She was told in the past that she has "scapular rotation" on the right   PRECAUTIONS: right UE Lymphedema risk, None  SUBJECTIVE: Pt returns for her 3 month L-Dex screen.   PAIN:  Are you having pain?  No  SOZO SCREENING: Patient was assessed today using the SOZO machine to determine the lymphedema index score. This was compared to her baseline score. It was determined that she is within the recommended range when compared to her baseline and no further action is needed at this time. She will continue SOZO screenings. These are done every 3 months for 2 years post operatively followed by every 6 months for 2 years, and then annually.   L-DEX FLOWSHEETS - 08/12/22 1600        L-DEX LYMPHEDEMA SCREENING   Measurement Type Unilateral    L-DEX MEASUREMENT EXTREMITY Upper Extremity    POSITION  Standing    DOMINANT SIDE Right    At Risk Side Right    BASELINE SCORE (UNILATERAL) -3.3    L-DEX SCORE (UNILATERAL) -0.9    VALUE CHANGE (UNILAT) 2.4              Otelia Limes, PTA 08/12/2022, 4:51 PM

## 2022-08-13 ENCOUNTER — Other Ambulatory Visit: Payer: Self-pay

## 2022-08-14 ENCOUNTER — Other Ambulatory Visit: Payer: Self-pay

## 2022-08-14 ENCOUNTER — Telehealth: Payer: 59 | Admitting: Emergency Medicine

## 2022-08-14 DIAGNOSIS — R300 Dysuria: Secondary | ICD-10-CM | POA: Diagnosis not present

## 2022-08-14 MED ORDER — CEPHALEXIN 500 MG PO CAPS
500.0000 mg | ORAL_CAPSULE | Freq: Two times a day (BID) | ORAL | 0 refills | Status: DC
  Filled 2022-08-14: qty 14, 7d supply, fill #0

## 2022-08-14 NOTE — Progress Notes (Signed)

## 2022-08-15 ENCOUNTER — Encounter: Payer: Self-pay | Admitting: Hematology and Oncology

## 2022-08-17 ENCOUNTER — Encounter: Payer: Self-pay | Admitting: Hematology and Oncology

## 2022-08-21 ENCOUNTER — Other Ambulatory Visit: Payer: Self-pay

## 2022-08-21 ENCOUNTER — Inpatient Hospital Stay: Payer: 59 | Attending: Hematology and Oncology

## 2022-08-21 ENCOUNTER — Ambulatory Visit: Payer: 59

## 2022-08-21 VITALS — BP 132/80 | HR 74 | Temp 98.8°F | Resp 18

## 2022-08-21 DIAGNOSIS — Z5111 Encounter for antineoplastic chemotherapy: Secondary | ICD-10-CM | POA: Diagnosis not present

## 2022-08-21 DIAGNOSIS — C50811 Malignant neoplasm of overlapping sites of right female breast: Secondary | ICD-10-CM | POA: Diagnosis not present

## 2022-08-21 DIAGNOSIS — Z79899 Other long term (current) drug therapy: Secondary | ICD-10-CM | POA: Diagnosis not present

## 2022-08-21 DIAGNOSIS — Z170 Estrogen receptor positive status [ER+]: Secondary | ICD-10-CM

## 2022-08-21 MED ORDER — GOSERELIN ACETATE 3.6 MG ~~LOC~~ IMPL
3.6000 mg | DRUG_IMPLANT | Freq: Once | SUBCUTANEOUS | Status: AC
  Administered 2022-08-21: 3.6 mg via SUBCUTANEOUS
  Filled 2022-08-21: qty 3.6

## 2022-08-29 ENCOUNTER — Other Ambulatory Visit (HOSPITAL_COMMUNITY): Payer: Self-pay

## 2022-09-01 DIAGNOSIS — Z760 Encounter for issue of repeat prescription: Secondary | ICD-10-CM | POA: Diagnosis not present

## 2022-09-05 ENCOUNTER — Inpatient Hospital Stay: Payer: Commercial Managed Care - PPO | Admitting: Hematology and Oncology

## 2022-09-05 ENCOUNTER — Inpatient Hospital Stay: Payer: Commercial Managed Care - PPO

## 2022-09-06 ENCOUNTER — Other Ambulatory Visit: Payer: Self-pay

## 2022-09-09 ENCOUNTER — Other Ambulatory Visit (HOSPITAL_COMMUNITY): Payer: Self-pay

## 2022-09-17 NOTE — Assessment & Plan Note (Addendum)
03/14/2021: Patient palpated a lump in the retroareolar right breast with nipple retraction.  Mammogram revealed right breast mass 2.6 cm and a single right axillary lymph node measuring 1.2 cm.  (PET/CT negative) breast MRI revealed 4.2 cm right retroareolar mass with non-mass enhancement total measuring 14 cm, intraparenchymal lymph node 1.1 cm.  2 abnormal right axillary lymph nodes, biopsy revealed grade 2 IDC with DCIS ER 95%, PR 95%, Ki-67 20% focally 50%, HER2 negative, lymph node positive   PET CT scan: Negative for distant metastatic disease MammaPrint: High risk   Treatment plan: 1.  Bilateral mastectomies 06/06/2021: [place]: Left mastectomy: Grade 2 IDC 0.3 cm, margins negative Right mastectomy: Grade 3 IDC with DCIS 7.5 cm, margins negative, 4/4 lymph nodes positive ER 95%, PR 95%, HER2 1+, Ki-67 20% focally 50% 2.  adjuvant chemotherapy with dose dense Adriamycin and Cytoxan x4 followed by Taxol x12 (patient refused chemo and radiation) 3. Adjuvant antiestrogen therapy with AI plus ovarian suppression plus or minus abemaciclib versus tamoxifen with or without abemaciclib ------------------------------------------------------------------------------------------------------------------------------------ Current treatment: Zoladex with letrozole and abemaciclib started 08/22/2021   Abemaciclib toxicities: Current dosing: 150 mg p.o. twice daily No significant diarrhea Tolerating it extremely well.   She comes monthly for injections.  We discussed with the patient about oophorectomy as an option to avoid getting monthly injections.  She is open to it and will discuss with her gynecologist.   Abdominal pain: CT CAP 03/03/22: No evidence of Met disease, 1.1 cm seroma rt Axilla Bone density 05/21/2022: Z-score -0.1: Normal

## 2022-09-18 ENCOUNTER — Inpatient Hospital Stay: Payer: Commercial Managed Care - PPO

## 2022-09-18 ENCOUNTER — Inpatient Hospital Stay: Payer: Commercial Managed Care - PPO | Admitting: Hematology and Oncology

## 2022-09-18 ENCOUNTER — Other Ambulatory Visit: Payer: Self-pay

## 2022-09-18 ENCOUNTER — Inpatient Hospital Stay: Payer: Commercial Managed Care - PPO | Attending: Hematology and Oncology

## 2022-09-18 VITALS — BP 132/78 | HR 77 | Temp 97.7°F | Resp 15 | Wt 221.1 lb

## 2022-09-18 DIAGNOSIS — C50811 Malignant neoplasm of overlapping sites of right female breast: Secondary | ICD-10-CM | POA: Diagnosis not present

## 2022-09-18 DIAGNOSIS — Z5111 Encounter for antineoplastic chemotherapy: Secondary | ICD-10-CM | POA: Insufficient documentation

## 2022-09-18 DIAGNOSIS — Z170 Estrogen receptor positive status [ER+]: Secondary | ICD-10-CM | POA: Diagnosis not present

## 2022-09-18 DIAGNOSIS — Z79899 Other long term (current) drug therapy: Secondary | ICD-10-CM | POA: Diagnosis not present

## 2022-09-18 LAB — CMP (CANCER CENTER ONLY)
ALT: 19 U/L (ref 0–44)
AST: 22 U/L (ref 15–41)
Albumin: 4.7 g/dL (ref 3.5–5.0)
Alkaline Phosphatase: 84 U/L (ref 38–126)
Anion gap: 7 (ref 5–15)
BUN: 12 mg/dL (ref 6–20)
CO2: 27 mmol/L (ref 22–32)
Calcium: 9.9 mg/dL (ref 8.9–10.3)
Chloride: 103 mmol/L (ref 98–111)
Creatinine: 1.04 mg/dL — ABNORMAL HIGH (ref 0.44–1.00)
GFR, Estimated: >60 mL/min (ref >60)
Glucose, Bld: 95 mg/dL (ref 70–99)
Potassium: 4.2 mmol/L (ref 3.5–5.1)
Sodium: 137 mmol/L (ref 135–145)
Total Bilirubin: 0.2 mg/dL — ABNORMAL LOW (ref 0.3–1.2)
Total Protein: 8.2 g/dL — ABNORMAL HIGH (ref 6.5–8.1)

## 2022-09-18 LAB — CBC WITH DIFFERENTIAL (CANCER CENTER ONLY)
Abs Immature Granulocytes: 0.01 10*3/uL (ref 0.00–0.07)
Basophils Absolute: 0.1 10*3/uL (ref 0.0–0.1)
Basophils Relative: 1 %
Eosinophils Absolute: 0.1 10*3/uL (ref 0.0–0.5)
Eosinophils Relative: 2 %
HCT: 35.8 % — ABNORMAL LOW (ref 36.0–46.0)
Hemoglobin: 12.8 g/dL (ref 12.0–15.0)
Immature Granulocytes: 0 %
Lymphocytes Relative: 37 %
Lymphs Abs: 2.1 10*3/uL (ref 0.7–4.0)
MCH: 30.6 pg (ref 26.0–34.0)
MCHC: 35.8 g/dL (ref 30.0–36.0)
MCV: 85.6 fL (ref 80.0–100.0)
Monocytes Absolute: 0.4 10*3/uL (ref 0.1–1.0)
Monocytes Relative: 7 %
Neutro Abs: 3.1 10*3/uL (ref 1.7–7.7)
Neutrophils Relative %: 53 %
Platelet Count: 310 10*3/uL (ref 150–400)
RBC: 4.18 MIL/uL (ref 3.87–5.11)
RDW: 12.3 % (ref 11.5–15.5)
WBC Count: 5.7 10*3/uL (ref 4.0–10.5)
nRBC: 0 % (ref 0.0–0.2)

## 2022-09-18 MED ORDER — GOSERELIN ACETATE 3.6 MG ~~LOC~~ IMPL
3.6000 mg | DRUG_IMPLANT | Freq: Once | SUBCUTANEOUS | Status: AC
  Administered 2022-09-18: 3.6 mg via SUBCUTANEOUS
  Filled 2022-09-18: qty 3.6

## 2022-09-18 NOTE — Progress Notes (Signed)
**Note Shannon via Obfuscation** Patient Care Team: Copland, Gay Filler, MD as PCP - General (Family Medicine) Theodore Demark, RN (Inactive) as Oncology Nurse Navigator Rockwell Germany, RN as Oncology Nurse Navigator Mauro Kaufmann, RN as Oncology Nurse Navigator Nicholas Lose, MD as Medical Oncologist (Hematology and Oncology)  DIAGNOSIS:  Encounter Diagnosis  Name Primary?   Carcinoma of overlapping sites of right breast in female, estrogen receptor positive ([place]) Yes    SUMMARY OF ONCOLOGIC HISTORY: Oncology History  Carcinoma of overlapping sites of right breast in female, estrogen receptor positive ([place])  03/14/2021 Initial Diagnosis   Patient palpated a lump in the retroareolar right breast with nipple retraction.  Mammogram revealed right breast mass 2.6 cm and a single right axillary lymph node measuring 1.2 cm.  (PET/CT negative) breast MRI revealed 4.2 cm right retroareolar mass with non-mass enhancement total measuring 14 cm, intraparenchymal lymph node 1.1 cm.  2 abnormal right axillary lymph nodes, biopsy revealed grade 2 IDC with DCIS ER 95%, PR 95%, Ki-67 20% focally 50%, HER2 negative, lymph node positive   04/10/2021 Cancer Staging   Staging form: Breast, AJCC 8th Edition - Clinical: Stage IIA (cT2, cN1, cM0, G2, ER+, PR+, HER2-) - Signed by Nicholas Lose, MD on 04/10/2021 Stage prefix: Initial diagnosis Histologic grading system: 3 grade system   04/24/2021 -  Anti-estrogen oral therapy   Tamoxifen 10 mg daily started neoadjuvantly because of slight delay in surgery   06/06/2021 Surgery   Bilateral mastectomies 06/06/2021: [facility]: Left mastectomy: Grade 2 IDC 0.3 cm, margins negative Right mastectomy: Grade 3 IDC with DCIS 7.5 cm, margins negative, 4/4 lymph nodes positive ER 95%, PR 95%, HER2 1+, Ki-67 20% focally 50%   09/02/2021 -  Anti-estrogen oral therapy   Verzinio with Zoladex and Letrozole     CHIEF COMPLIANT: Follow-up of right breast cancer on Zoladex and Letrozole    INTERVAL  HISTORY: DEASHA Hale is a 42 y.o. with above-mentioned history of right breast cancer having undergone bilateral mastectomies. Currently on Zoladex and letrozole Verzinio. She presents to the clinic today for follow-up. She states that she still have diarrhea but t is not frequent. She still have joint paint but she started doing yoga. She reports the shots is doing fine.   ALLERGIES:  has No Known Allergies.  MEDICATIONS:  Current Outpatient Medications  Medication Sig Dispense Refill   abemaciclib (VERZENIO) 150 MG tablet Take 1 tablet (150 mg total) by mouth 2 (two) times daily. 56 tablet 3   AMBULATORY NON FORMULARY MEDICATION Eg Cg Boost supplement     APPLE CIDER VINEGAR PO Take 450 mg by mouth. (Patient not taking: Reported on 10/17/2021)     ascorbic acid (VITAMIN C) 1000 MG tablet Take by mouth.     cephALEXin (KEFLEX) 500 MG capsule Take 1 capsule (500 mg total) by mouth 2 (two) times daily. 14 capsule 0   Cholecalciferol (VITAMIN D-3) 125 MCG (5000 UT) TABS Take 125 mcg by mouth. (Patient not taking: Reported on 07/24/2022)     famotidine (PEPCID) 10 MG tablet 10 mg daily as needed for heartburn or indigestion.     l-methylfolate-B6-B12 (METANX) 3-35-2 MG TABS tablet Take 1 tablet by mouth daily.     letrozole (FEMARA) 2.5 MG tablet Take 1 tablet (2.5 mg total) by mouth daily. 90 tablet 3   ondansetron (ZOFRAN) 4 MG tablet Take 1 tablet (4 mg total) by mouth every 8 (eight) hours as needed for nausea or vomiting. 30 tablet 6  Rimegepant Sulfate (NURTEC) 75 MG TBDP Use one tablet daily as needed for migraine (Patient not taking: Reported on 10/17/2021) 5 tablet 1   Turmeric 450 MG CAPS Take by mouth.     vitamin E 180 MG (400 UNITS) capsule Take 400 Units by mouth daily.     No current facility-administered medications for this visit.   Facility-Administered Medications Ordered in Other Visits  Medication Dose Route Frequency Provider Last Rate Last Admin   goserelin  (ZOLADEX) injection 3.6 mg  3.6 mg Subcutaneous Once Nicholas Lose, MD        PHYSICAL EXAMINATION: ECOG PERFORMANCE STATUS: 1 - Symptomatic but completely ambulatory  Vitals:   09/18/22 1458  BP: 132/78  Pulse: 77  Resp: 15  Temp: 97.7 F (36.5 C)  SpO2: 99%   Filed Weights   09/18/22 1458  Weight: 221 lb 1.6 oz (100.3 kg)      LABORATORY DATA:  I have reviewed the data as listed    Latest Ref Rng & Units 07/24/2022    2:35 PM 05/13/2022    2:05 PM 03/13/2022    2:27 PM  CMP  Glucose 70 - 99 mg/dL 95  105  95   BUN 6 - 20 mg/dL '11  9  12   '$ Creatinine 0.44 - 1.00 mg/dL 1.13  1.03  1.02   Sodium 135 - 145 mmol/L 140  136  141   Potassium 3.5 - 5.1 mmol/L 4.0  3.7  4.2   Chloride 98 - 111 mmol/L 104  103  106   CO2 22 - 32 mmol/L '29  27  26   '$ Calcium 8.9 - 10.3 mg/dL 10.5  10.3  10.6   Total Protein 6.5 - 8.1 g/dL 8.4  8.1  8.7   Total Bilirubin 0.3 - 1.2 mg/dL 0.4  0.4  0.3   Alkaline Phos 38 - 126 U/L 104  75  78   AST 15 - 41 U/L '21  17  18   '$ ALT 0 - 44 U/L '20  11  18     '$ Lab Results  Component Value Date   WBC 5.7 09/18/2022   HGB 12.8 09/18/2022   HCT 35.8 (L) 09/18/2022   MCV 85.6 09/18/2022   PLT 310 09/18/2022   NEUTROABS 3.1 09/18/2022    ASSESSMENT & PLAN:  Carcinoma of overlapping sites of right breast in female, estrogen receptor positive ([place]) 03/14/2021: Patient palpated a lump in the retroareolar right breast with nipple retraction.  Mammogram revealed right breast mass 2.6 cm and a single right axillary lymph node measuring 1.2 cm.  (PET/CT negative) breast MRI revealed 4.2 cm right retroareolar mass with non-mass enhancement total measuring 14 cm, intraparenchymal lymph node 1.1 cm.  2 abnormal right axillary lymph nodes, biopsy revealed grade 2 IDC with DCIS ER 95%, PR 95%, Ki-67 20% focally 50%, HER2 negative, lymph node positive   PET CT scan: Negative for distant metastatic disease MammaPrint: High risk   Treatment plan: 1.  Bilateral  mastectomies 06/06/2021: [facility]: Left mastectomy: Grade 2 IDC 0.3 cm, margins negative Right mastectomy: Grade 3 IDC with DCIS 7.5 cm, margins negative, 4/4 lymph nodes positive ER 95%, PR 95%, HER2 1+, Ki-67 20% focally 50% 2.  adjuvant chemotherapy with dose dense Adriamycin and Cytoxan x4 followed by Taxol x12 (patient refused chemo and radiation) 3. Adjuvant antiestrogen therapy with AI plus ovarian suppression plus or minus abemaciclib versus tamoxifen with or without abemaciclib ------------------------------------------------------------------------------------------------------------------------------------ Current treatment: Zoladex with letrozole and abemaciclib  started 08/22/2021   Abemaciclib toxicities: Current dosing: 150 mg p.o. twice daily No significant diarrhea Tolerating it extremely well.   She comes monthly for injections.  We discussed with the patient about oophorectomy as an option to avoid getting monthly injections.  She is open to it and will discuss with her gynecologist.   Abdominal pain: CT CAP 03/03/22: No evidence of Met disease, 1.1 cm seroma rt Axilla Bone density 05/21/2022: Z-score -0.1: Normal    No orders of the defined types were placed in this encounter.  The patient has a good understanding of the overall plan. she agrees with it. she will call with any problems that may develop before the next visit here. Total time spent: 30 mins including face to face time and time spent for planning, charting and co-ordination of care   Harriette Ohara, MD 09/18/22    I Gardiner Coins am acting as a Education administrator for [employer]  I have reviewed the above documentation for accuracy and completeness, and I agree with the above.

## 2022-09-20 ENCOUNTER — Ambulatory Visit: Payer: Commercial Managed Care - PPO

## 2022-09-26 ENCOUNTER — Encounter: Payer: Self-pay | Admitting: Hematology and Oncology

## 2022-09-26 ENCOUNTER — Other Ambulatory Visit (HOSPITAL_COMMUNITY): Payer: Self-pay

## 2022-10-01 ENCOUNTER — Other Ambulatory Visit (HOSPITAL_COMMUNITY): Payer: Self-pay

## 2022-10-02 ENCOUNTER — Other Ambulatory Visit (HOSPITAL_COMMUNITY): Payer: Self-pay

## 2022-10-14 ENCOUNTER — Encounter: Payer: Self-pay | Admitting: Family Medicine

## 2022-10-16 ENCOUNTER — Inpatient Hospital Stay: Payer: Commercial Managed Care - PPO | Attending: Hematology and Oncology

## 2022-10-16 VITALS — BP 122/74 | HR 79 | Temp 98.4°F | Resp 20

## 2022-10-16 DIAGNOSIS — Z5111 Encounter for antineoplastic chemotherapy: Secondary | ICD-10-CM | POA: Diagnosis not present

## 2022-10-16 DIAGNOSIS — C50811 Malignant neoplasm of overlapping sites of right female breast: Secondary | ICD-10-CM | POA: Diagnosis not present

## 2022-10-16 DIAGNOSIS — Z79899 Other long term (current) drug therapy: Secondary | ICD-10-CM | POA: Diagnosis not present

## 2022-10-16 MED ORDER — GOSERELIN ACETATE 3.6 MG ~~LOC~~ IMPL
3.6000 mg | DRUG_IMPLANT | Freq: Once | SUBCUTANEOUS | Status: AC
  Administered 2022-10-16: 3.6 mg via SUBCUTANEOUS
  Filled 2022-10-16: qty 3.6

## 2022-10-28 ENCOUNTER — Other Ambulatory Visit: Payer: Self-pay | Admitting: Hematology and Oncology

## 2022-10-28 ENCOUNTER — Other Ambulatory Visit: Payer: Self-pay

## 2022-10-29 ENCOUNTER — Other Ambulatory Visit (HOSPITAL_COMMUNITY): Payer: Self-pay

## 2022-10-29 MED ORDER — ABEMACICLIB 150 MG PO TABS
150.0000 mg | ORAL_TABLET | Freq: Two times a day (BID) | ORAL | 3 refills | Status: DC
  Filled 2022-10-29: qty 56, 28d supply, fill #0
  Filled 2022-11-21: qty 56, 28d supply, fill #1
  Filled 2022-12-23: qty 56, 28d supply, fill #2
  Filled 2023-01-16: qty 56, 28d supply, fill #3

## 2022-10-29 MED ORDER — LETROZOLE 2.5 MG PO TABS
2.5000 mg | ORAL_TABLET | Freq: Every day | ORAL | 3 refills | Status: DC
  Filled 2022-10-29: qty 90, 90d supply, fill #0
  Filled 2023-04-17: qty 90, 90d supply, fill #1

## 2022-10-30 ENCOUNTER — Other Ambulatory Visit (HOSPITAL_COMMUNITY): Payer: Self-pay

## 2022-11-12 ENCOUNTER — Other Ambulatory Visit: Payer: Self-pay | Admitting: *Deleted

## 2022-11-12 ENCOUNTER — Encounter: Payer: Self-pay | Admitting: Pharmacist

## 2022-11-12 DIAGNOSIS — Z170 Estrogen receptor positive status [ER+]: Secondary | ICD-10-CM

## 2022-11-13 ENCOUNTER — Inpatient Hospital Stay: Payer: Commercial Managed Care - PPO

## 2022-11-13 ENCOUNTER — Other Ambulatory Visit: Payer: Self-pay

## 2022-11-13 ENCOUNTER — Inpatient Hospital Stay: Payer: Commercial Managed Care - PPO | Admitting: Pharmacist

## 2022-11-13 ENCOUNTER — Inpatient Hospital Stay: Payer: Commercial Managed Care - PPO | Attending: Hematology and Oncology

## 2022-11-13 VITALS — BP 112/71 | HR 73 | Temp 97.9°F | Resp 18 | Ht 69.0 in | Wt 222.1 lb

## 2022-11-13 DIAGNOSIS — C50811 Malignant neoplasm of overlapping sites of right female breast: Secondary | ICD-10-CM | POA: Insufficient documentation

## 2022-11-13 DIAGNOSIS — Z170 Estrogen receptor positive status [ER+]: Secondary | ICD-10-CM

## 2022-11-13 DIAGNOSIS — Z5111 Encounter for antineoplastic chemotherapy: Secondary | ICD-10-CM | POA: Diagnosis not present

## 2022-11-13 DIAGNOSIS — Z79899 Other long term (current) drug therapy: Secondary | ICD-10-CM | POA: Diagnosis not present

## 2022-11-13 LAB — CMP (CANCER CENTER ONLY)
ALT: 13 U/L (ref 0–44)
AST: 18 U/L (ref 15–41)
Albumin: 4.7 g/dL (ref 3.5–5.0)
Alkaline Phosphatase: 110 U/L (ref 38–126)
Anion gap: 7 (ref 5–15)
BUN: 9 mg/dL (ref 6–20)
CO2: 28 mmol/L (ref 22–32)
Calcium: 10 mg/dL (ref 8.9–10.3)
Chloride: 103 mmol/L (ref 98–111)
Creatinine: 1.15 mg/dL — ABNORMAL HIGH (ref 0.44–1.00)
GFR, Estimated: >60 mL/min (ref >60)
Glucose, Bld: 90 mg/dL (ref 70–99)
Potassium: 4.1 mmol/L (ref 3.5–5.1)
Sodium: 138 mmol/L (ref 135–145)
Total Bilirubin: 0.4 mg/dL (ref 0.3–1.2)
Total Protein: 8.2 g/dL — ABNORMAL HIGH (ref 6.5–8.1)

## 2022-11-13 LAB — CBC WITH DIFFERENTIAL (CANCER CENTER ONLY)
Abs Immature Granulocytes: 0.01 10*3/uL (ref 0.00–0.07)
Basophils Absolute: 0.1 10*3/uL (ref 0.0–0.1)
Basophils Relative: 1 %
Eosinophils Absolute: 0.1 10*3/uL (ref 0.0–0.5)
Eosinophils Relative: 2 %
HCT: 36.6 % (ref 36.0–46.0)
Hemoglobin: 12.8 g/dL (ref 12.0–15.0)
Immature Granulocytes: 0 %
Lymphocytes Relative: 36 %
Lymphs Abs: 1.8 10*3/uL (ref 0.7–4.0)
MCH: 30.3 pg (ref 26.0–34.0)
MCHC: 35 g/dL (ref 30.0–36.0)
MCV: 86.7 fL (ref 80.0–100.0)
Monocytes Absolute: 0.3 10*3/uL (ref 0.1–1.0)
Monocytes Relative: 6 %
Neutro Abs: 2.7 10*3/uL (ref 1.7–7.7)
Neutrophils Relative %: 55 %
Platelet Count: 303 10*3/uL (ref 150–400)
RBC: 4.22 MIL/uL (ref 3.87–5.11)
RDW: 12.2 % (ref 11.5–15.5)
WBC Count: 4.9 10*3/uL (ref 4.0–10.5)
nRBC: 0 % (ref 0.0–0.2)

## 2022-11-13 MED ORDER — GOSERELIN ACETATE 3.6 MG ~~LOC~~ IMPL
3.6000 mg | DRUG_IMPLANT | Freq: Once | SUBCUTANEOUS | Status: AC
  Administered 2022-11-13: 3.6 mg via SUBCUTANEOUS
  Filled 2022-11-13: qty 3.6

## 2022-11-13 NOTE — Progress Notes (Signed)
[place]       Telephone: [phone]?Fax: [phone]   Shannon Hale Progress Note  Shannon Hale was contacted via in-person to discuss her chemotherapy regimen for abemaciclib which they receive under the care of Shannon Hale.   Current treatment regimen and start date Abemaciclib (09/03/21) Goserelin (08/22/21) Letrozole (08/02/21)   Interval History She continues on abemaciclib 150 mg by mouth every 12 hours on days 1 to 28 of a 28-day cycle. This is being given in combination with letrozole and goserelin . Therapy is planned to continue until  2 years in the adjuvant setting per the monarchE trial data. She last saw Shannon Hale on 09/18/22 and clinical pharmacy on 07/24/22.  Her last visit with Shannon Hale, her calcium had normalized and it continues to be WNL off of calcium supplementation.  Response to Therapy Ms. Nin continues to do well on abemaciclib. She is using loperamide as needed. Usually one tablet when she has a few loose stools. She has missed about 3 doses since her last visit with Shannon Hale. The turmeric is working well for her. She states she has not followed up with her GYN regarding a BSO. She continues with every 4 week goserelin for now. Will be administered today. Her Scr is slightly elevated today but she admits to not drinking enough fluids lately. Labs, vitals, treatment parameters, and manufacturer guidelines assessing toxicity were reviewed with Shannon Hale today. Based on these values, patient is in agreement to continue abemaciclib therapy at this time.  Allergies No Known Allergies  Vitals    11/13/2022    2:10 PM 10/16/2022    4:18 PM 09/18/2022    2:58 PM  Shannon Vitals  Height 175 cm    Weight 100.744 kg  100.29 kg  Weight (lbs) 222 lbs 2 oz  221 lbs 2 oz  BMI 32.8 kg/m2   32.8 kg/m2  32.65 kg/m2   32.65 kg/m2  Temp 97.9 F (36.6 C) 98.4 F (36.9 C) 97.7 F (36.5 C)  Pulse Rate  73 79 77  BP 112/71 122/74 132/78  Resp '18 20 15  '$ SpO2 100 % 97 % 99 %  BSA (m2) 2.21 m2   2.21 m2  2.21 m2   2.21 m2    Laboratory Data    Latest Ref Rng & Units 11/13/2022    1:24 PM 09/18/2022    2:41 PM 07/24/2022    2:35 PM  CBC EXTENDED  WBC 4.0 - 10.5 K/uL 4.9  5.7  5.7   RBC 3.87 - 5.11 MIL/uL 4.22  4.18  4.29   Hemoglobin 12.0 - 15.0 g/dL 12.8  12.8  13.0   HCT 36.0 - 46.0 % 36.6  35.8  37.1   Platelets 150 - 400 K/uL 303  310  341   NEUT# 1.7 - 7.7 K/uL 2.7  3.1  3.2   Lymph# 0.7 - 4.0 K/uL 1.8  2.1  1.9        Latest Ref Rng & Units 11/13/2022    1:24 PM 09/18/2022    2:41 PM 07/24/2022    2:35 PM  CMP  Glucose 70 - 99 mg/dL 90  95  95   BUN 6 - 20 mg/dL '9  12  11   '$ Creatinine 0.44 - 1.00 mg/dL 1.15  1.04  1.13   Sodium 135 - 145 mmol/L 138  137  140   Potassium 3.5 - 5.1 mmol/L 4.1  4.2  4.0   Chloride 98 - 111 mmol/L 103  103  104   CO2 22 - 32 mmol/L '28  27  29   '$ Calcium 8.9 - 10.3 mg/dL 10.0  9.9  10.5   Total Protein 6.5 - 8.1 g/dL 8.2  8.2  8.4   Total Bilirubin 0.3 - 1.2 mg/dL 0.4  0.2  0.4   Alkaline Phos 38 - 126 U/L 110  84  104   AST 15 - 41 U/L '18  22  21   '$ ALT 0 - 44 U/L '13  19  20     '$ No results found for: "MG" Lab Results  Component Value Date   CA2729 29.3 03/20/2021     Adverse Effects Assessment Diarrhea: responding to loperamide Scr: encouraged to drink more fluids Hypercalcemia: resolved off of calcium supplementation  Adherence Assessment Clayre Brandy Seal reports missing 3 doses over the past 8 weeks.   Reason for missed dose: forgot Patient was re-educated on importance of adherence.   Access Assessment Shannon Hale is currently receiving her abemaciclib through [facility] concerns:  none  Medication Reconciliation The patient's medication list was reviewed today with the patient? Yes New medications or herbal supplements have recently been started? No  Any medications have been  discontinued? No  The medication list was updated and reconciled based on the patient's most recent medication list in the electronic medical record (EMR) including herbal products and OTC medications.   Medications Current Outpatient Medications  Medication Sig Dispense Refill   abemaciclib (VERZENIO) 150 MG tablet Take 1 tablet (150 mg total) by mouth 2 (two) times daily. 56 tablet 3   AMBULATORY NON FORMULARY MEDICATION Eg Cg Boost supplement     APPLE CIDER VINEGAR PO Take 450 mg by mouth. (Patient not taking: Reported on 10/17/2021)     ascorbic acid (VITAMIN C) 1000 MG tablet Take by mouth.     cephALEXin (KEFLEX) 500 MG capsule Take 1 capsule (500 mg total) by mouth 2 (two) times daily. 14 capsule 0   Cholecalciferol (VITAMIN D-3) 125 MCG (5000 UT) TABS Take 125 mcg by mouth. (Patient not taking: Reported on 07/24/2022)     famotidine (PEPCID) 10 MG tablet 10 mg daily as needed for heartburn or indigestion.     l-methylfolate-B6-B12 (METANX) 3-35-2 MG TABS tablet Take 1 tablet by mouth daily.     letrozole (FEMARA) 2.5 MG tablet Take 1 tablet (2.5 mg total) by mouth daily. 90 tablet 3   ondansetron (ZOFRAN) 4 MG tablet Take 1 tablet (4 mg total) by mouth every 8 (eight) hours as needed for nausea or vomiting. 30 tablet 6   Rimegepant Sulfate (NURTEC) 75 MG TBDP Use one tablet daily as needed for migraine (Patient not taking: Reported on 10/17/2021) 5 tablet 1   Turmeric 450 MG CAPS Take by mouth.     vitamin E 180 MG (400 UNITS) capsule Take 400 Units by mouth daily.     No current facility-administered medications for this visit.    Drug-Drug Interactions (DDIs) DDIs were evaluated? Yes Significant DDIs? No  The patient was instructed to speak with their health care provider and/or the oral chemotherapy pharmacist before starting any new drug, including prescription or over the counter, natural / herbal products, or vitamins.  Supportive Care Diarrhea: we reviewed that diarrhea is  common with abemaciclib and confirmed that she does have loperamide (Imodium) at home.  We reviewed how to take this medication PRN. Neutropenia:  we discussed the importance of having a thermometer and what the Centers for Disease Control and Prevention (CDC) considers a fever which is 100.1F (38C) or higher.  Gave patient 24/7 triage line to call if any fevers or symptoms. ILD/Pneumonitis: we reviewed potential symptoms including cough, shortness, and fatigue.  VTE: reviewed signs of DVT such as leg swelling, redness, pain, or tenderness and signs of PE such as shortness of breath, rapid or irregular heartbeat, cough, chest pain, or lightheadedness. Reviewed to take the medication every 12 hours (with food sometimes can be easier on the stomach) and to take it at the same time every day. Hepatotoxicity: WNL  Dosing Assessment Hepatic adjustments needed? No  Renal adjustments needed? No  Toxicity adjustments needed? No  The current dosing regimen is appropriate to continue at this time.  Follow-Up Plan Continue abemaciclib 150 mg by mouth every 12 hours. Continue letrozole 2.5 mg by mouth daily.  Continue goserelin 3.6 mg subcutaneously every 28 days.  Dose will be given today and we will move upcoming appointments to reflect every 28-day frequency Labs, Shannon Hale visit, goserelin injection, in 3 months Will add goserelin injection for 01/08/23 (12/11/22 already scheduled) We will add labs, pharmacy clinic visit, goserelin injection in 6 months  Shannon Hale participated in the discussion, expressed understanding, and voiced agreement with the above plan. All questions were answered to her satisfaction. The patient was advised to contact the clinic at [phone] with any questions or concerns prior to her return visit.   I spent 30 minutes assessing and educating the patient.  Raina Mina, RPH-CPP, 11/13/2022  2:34 PM   **Disclaimer: This note was dictated with voice  recognition software. Similar sounding words can inadvertently be transcribed and this note may contain transcription errors which may not have been corrected upon publication of note.**

## 2022-11-14 ENCOUNTER — Telehealth: Payer: Self-pay | Admitting: Pharmacist

## 2022-11-14 NOTE — Telephone Encounter (Signed)
Scheduled appointments per 3/13 los. Patient is aware of the made appointments.

## 2022-11-21 ENCOUNTER — Telehealth: Payer: Commercial Managed Care - PPO | Admitting: Physician Assistant

## 2022-11-21 ENCOUNTER — Other Ambulatory Visit: Payer: Self-pay

## 2022-11-21 ENCOUNTER — Encounter: Payer: Self-pay | Admitting: Hematology and Oncology

## 2022-11-21 ENCOUNTER — Other Ambulatory Visit (HOSPITAL_COMMUNITY): Payer: Self-pay

## 2022-11-21 DIAGNOSIS — R3989 Other symptoms and signs involving the genitourinary system: Secondary | ICD-10-CM | POA: Diagnosis not present

## 2022-11-21 MED ORDER — CEPHALEXIN 500 MG PO CAPS
500.0000 mg | ORAL_CAPSULE | Freq: Two times a day (BID) | ORAL | 0 refills | Status: AC
  Filled 2022-11-21: qty 14, 7d supply, fill #0

## 2022-11-21 NOTE — Progress Notes (Signed)
I have spent 5 minutes in review of e-visit questionnaire, review and updating patient chart, medical decision making and response to patient.   Samay Delcarlo Cody Cleo Santucci, PA-C    

## 2022-11-21 NOTE — Progress Notes (Signed)

## 2022-11-25 ENCOUNTER — Ambulatory Visit: Payer: Commercial Managed Care - PPO | Attending: General Surgery

## 2022-11-25 VITALS — Wt 220.2 lb

## 2022-11-25 DIAGNOSIS — Z483 Aftercare following surgery for neoplasm: Secondary | ICD-10-CM | POA: Insufficient documentation

## 2022-11-25 NOTE — Therapy (Signed)
OUTPATIENT PHYSICAL THERAPY SOZO SCREENING NOTE   Patient Name: Shannon Hale MRN: [id number] DOB:1981-05-21, 42 y.o., female Today's Date: 11/25/2022  PCP: Darreld Mclean, MD REFERRING PROVIDER: Rolm Bookbinder, MD   PT End of Session - 11/25/22 1639     Visit Number 4   # unchanged due to screen only   PT Start Time [id number]    PT Stop Time 1641    PT Time Calculation (min) 4 min    Activity Tolerance Patient tolerated treatment well    Behavior During Therapy [facility] for tasks assessed/performed             Past Medical History:  Diagnosis Date   Abnormal Pap smear    Headache(784.0)    migraines   Hepatitis 03/2004   unknown form of hepatitis, no current liver problems   History of chicken pox    History of D&C    D&E   Migraine    Nausea    PONV (postoperative nausea and vomiting)    Vaginal Pap smear, abnormal    Past Surgical History:  Procedure Laterality Date   BREAST RECONSTRUCTION Bilateral 06/06/2021   Procedure: BILATERAL BREAST RECONSTRUCTION WITH TISSUE REARRANGEMENT;  Surgeon: Wallace Going, DO;  Location: [place];  Service: Plastics;  Laterality: Bilateral;   CHOLECYSTECTOMY N/A 12/01/2013   Procedure: LAPAROSCOPIC CHOLECYSTECTOMY ;  Surgeon: Harl Bowie, MD;  Location: [facility];  Service: General;  Laterality: N/A;   DILATION AND EVACUATION N/A 07/14/2014   Procedure: DILATATION AND EVACUATION;  Surgeon: Marylynn Pearson, MD;  Location: [place] [facility];  Service: Gynecology;  Laterality: N/A;   MASTECTOMY W/ SENTINEL NODE BIOPSY Right 06/06/2021   Procedure: RIGHT MASTECTOMY WITH AXILLARY SENTINEL LYMPH NODE BIOPSY;  Surgeon: Rolm Bookbinder, MD;  Location: [place];  Service: General;  Laterality: Right;   RADIOACTIVE SEED GUIDED AXILLARY SENTINEL LYMPH NODE Right 06/06/2021   Procedure: RADIOACTIVE SEED GUIDED RIGHT AXILLARY SENTINEL LYMPH NODE EXCISION;  Surgeon: Rolm Bookbinder, MD;  Location: [place];  Service: General;  Laterality: Right;   SEPTOPLASTY     TONSILLECTOMY     TOTAL MASTECTOMY Left 06/06/2021   Procedure: LEFT TOTAL MASTECTOMY;  Surgeon: Rolm Bookbinder, MD;  Location: [place];  Service: General;  Laterality: Left;   [place] EXTRACTION     Patient Active Problem List   Diagnosis Date Noted   S/P bilateral mastectomy 06/06/2021   Breast cancer ([place]) 03/27/2021   Carcinoma of overlapping sites of right breast in female, estrogen receptor positive ([place]) 03/22/2021   Headache 06/26/2020   Rash 06/26/2020   New daily persistent headache 05/21/2018   Disorder of ligament of left hand 07/20/2015   Obesity 09/25/2014   Dermatitis 12/31/2013   Cholelithiasis 02/29/2012   BACK PAIN, THORACIC REGION 11/19/2010   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 Bilateral mastectomy with no reconstruction 06/06/21. No chemotherapy. Still thinking about radiation.  4/4 nodes positive on the Rt.  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  She was told in the past that she has "scapular rotation" on the right   PRECAUTIONS: right UE Lymphedema risk, None  SUBJECTIVE: Pt returns for her 3 month L-Dex screen.   PAIN:  Are you having pain?  No  SOZO SCREENING: Patient was assessed today using the SOZO machine to determine the lymphedema index score. This was compared to her baseline score. It was determined that she is within the recommended range when compared to her baseline and no further action is needed at this time. She will continue SOZO screenings. These are done every 3 months for 2 years post operatively followed by every 6 months for 2 years, and then annually.   L-DEX FLOWSHEETS - 11/25/22 1600        L-DEX LYMPHEDEMA SCREENING   Measurement Type Unilateral    L-DEX MEASUREMENT EXTREMITY Upper Extremity    POSITION  Standing    DOMINANT SIDE Right    At Risk Side Right    BASELINE SCORE (UNILATERAL) -3.3    L-DEX SCORE (UNILATERAL) -2.7    VALUE CHANGE (UNILAT) 0.6              Otelia Limes, PTA 11/25/2022, 4:41 PM

## 2022-11-26 ENCOUNTER — Other Ambulatory Visit: Payer: Self-pay

## 2022-11-27 ENCOUNTER — Other Ambulatory Visit (HOSPITAL_COMMUNITY): Payer: Self-pay

## 2022-12-11 ENCOUNTER — Other Ambulatory Visit: Payer: Self-pay

## 2022-12-11 ENCOUNTER — Inpatient Hospital Stay: Payer: Commercial Managed Care - PPO | Attending: Hematology and Oncology

## 2022-12-11 VITALS — BP 112/65 | HR 83 | Temp 97.7°F | Resp 18

## 2022-12-11 DIAGNOSIS — Z79899 Other long term (current) drug therapy: Secondary | ICD-10-CM | POA: Diagnosis not present

## 2022-12-11 DIAGNOSIS — C50811 Malignant neoplasm of overlapping sites of right female breast: Secondary | ICD-10-CM | POA: Diagnosis not present

## 2022-12-11 DIAGNOSIS — Z5111 Encounter for antineoplastic chemotherapy: Secondary | ICD-10-CM | POA: Insufficient documentation

## 2022-12-11 DIAGNOSIS — Z170 Estrogen receptor positive status [ER+]: Secondary | ICD-10-CM

## 2022-12-11 MED ORDER — GOSERELIN ACETATE 3.6 MG ~~LOC~~ IMPL
3.6000 mg | DRUG_IMPLANT | Freq: Once | SUBCUTANEOUS | Status: AC
  Administered 2022-12-11: 3.6 mg via SUBCUTANEOUS
  Filled 2022-12-11: qty 3.6

## 2022-12-18 ENCOUNTER — Other Ambulatory Visit: Payer: Self-pay

## 2022-12-19 ENCOUNTER — Other Ambulatory Visit (HOSPITAL_COMMUNITY): Payer: Self-pay

## 2022-12-23 ENCOUNTER — Other Ambulatory Visit (HOSPITAL_COMMUNITY): Payer: Self-pay

## 2022-12-25 ENCOUNTER — Other Ambulatory Visit (HOSPITAL_COMMUNITY): Payer: Self-pay

## 2022-12-30 ENCOUNTER — Other Ambulatory Visit: Payer: Self-pay

## 2022-12-30 MED ORDER — CHLORHEXIDINE GLUCONATE 0.12 % MT SOLN
OROMUCOSAL | 0 refills | Status: DC
  Filled 2022-12-30: qty 473, 14d supply, fill #0

## 2022-12-30 MED ORDER — BETAMETHASONE DIPROPIONATE AUG 0.05 % EX OINT
TOPICAL_OINTMENT | Freq: Four times a day (QID) | CUTANEOUS | 0 refills | Status: DC
  Filled 2022-12-30: qty 15, 10d supply, fill #0

## 2023-01-02 ENCOUNTER — Encounter: Payer: Self-pay | Admitting: Hematology and Oncology

## 2023-01-08 ENCOUNTER — Inpatient Hospital Stay: Payer: Commercial Managed Care - PPO | Attending: Hematology and Oncology

## 2023-01-08 ENCOUNTER — Other Ambulatory Visit: Payer: Self-pay

## 2023-01-08 VITALS — BP 124/79 | HR 96 | Temp 98.8°F | Resp 18

## 2023-01-08 DIAGNOSIS — Z5111 Encounter for antineoplastic chemotherapy: Secondary | ICD-10-CM | POA: Insufficient documentation

## 2023-01-08 DIAGNOSIS — Z170 Estrogen receptor positive status [ER+]: Secondary | ICD-10-CM

## 2023-01-08 DIAGNOSIS — Z79899 Other long term (current) drug therapy: Secondary | ICD-10-CM | POA: Insufficient documentation

## 2023-01-08 DIAGNOSIS — C50811 Malignant neoplasm of overlapping sites of right female breast: Secondary | ICD-10-CM | POA: Diagnosis not present

## 2023-01-08 MED ORDER — GOSERELIN ACETATE 3.6 MG ~~LOC~~ IMPL
3.6000 mg | DRUG_IMPLANT | Freq: Once | SUBCUTANEOUS | Status: AC
  Administered 2023-01-08: 3.6 mg via SUBCUTANEOUS
  Filled 2023-01-08: qty 3.6

## 2023-01-16 ENCOUNTER — Other Ambulatory Visit (HOSPITAL_COMMUNITY): Payer: Self-pay

## 2023-01-16 ENCOUNTER — Telehealth: Payer: Commercial Managed Care - PPO | Admitting: Physician Assistant

## 2023-01-16 ENCOUNTER — Other Ambulatory Visit: Payer: Self-pay

## 2023-01-16 DIAGNOSIS — J019 Acute sinusitis, unspecified: Secondary | ICD-10-CM

## 2023-01-16 DIAGNOSIS — B9689 Other specified bacterial agents as the cause of diseases classified elsewhere: Secondary | ICD-10-CM | POA: Diagnosis not present

## 2023-01-16 MED ORDER — AMOXICILLIN-POT CLAVULANATE 875-125 MG PO TABS
1.0000 | ORAL_TABLET | Freq: Two times a day (BID) | ORAL | 0 refills | Status: DC
Start: 2023-01-16 — End: 2023-02-06
  Filled 2023-01-16: qty 14, 7d supply, fill #0

## 2023-01-16 NOTE — Progress Notes (Signed)

## 2023-01-17 ENCOUNTER — Other Ambulatory Visit: Payer: Self-pay

## 2023-01-23 ENCOUNTER — Encounter: Payer: Self-pay | Admitting: Hematology and Oncology

## 2023-01-27 NOTE — Progress Notes (Signed)
Patient Care Team: Copland, Gwenlyn Found, MD as PCP - General (Family Medicine) Scarlett Presto, RN (Inactive) as Oncology Nurse Navigator Donnelly Angelica, RN as Oncology Nurse Navigator Pershing Proud, RN as Oncology Nurse Navigator Serena Croissant, MD as Medical Oncologist (Hematology and Oncology) Zelphia Cairo, MD as Consulting Physician (Obstetrics and Gynecology)  DIAGNOSIS:  Encounter Diagnosis  Name Primary?   Carcinoma of overlapping sites of right breast in female, estrogen receptor positive (HCC) Yes    SUMMARY OF ONCOLOGIC HISTORY: Oncology History  Carcinoma of overlapping sites of right breast in female, estrogen receptor positive (HCC)  03/14/2021 Initial Diagnosis   Patient palpated a lump in the retroareolar right breast with nipple retraction.  Mammogram revealed right breast mass 2.6 cm and a single right axillary lymph node measuring 1.2 cm.  (PET/CT negative) breast MRI revealed 4.2 cm right retroareolar mass with non-mass enhancement total measuring 14 cm, intraparenchymal lymph node 1.1 cm.  2 abnormal right axillary lymph nodes, biopsy revealed grade 2 IDC with DCIS ER 95%, PR 95%, Ki-67 20% focally 50%, HER2 negative, lymph node positive   04/10/2021 Cancer Staging   Staging form: Breast, AJCC 8th Edition - Clinical: Stage IIA (cT2, cN1, cM0, G2, ER+, PR+, HER2-) - Signed by Serena Croissant, MD on 04/10/2021 Stage prefix: Initial diagnosis Histologic grading system: 3 grade system   04/24/2021 -  Anti-estrogen oral therapy   Tamoxifen 10 mg daily started neoadjuvantly because of slight delay in surgery   06/06/2021 Surgery   Bilateral mastectomies 06/06/2021: [facility]: Left mastectomy: Grade 2 IDC 0.3 cm, margins negative Right mastectomy: Grade 3 IDC with DCIS 7.5 cm, margins negative, 4/4 lymph nodes positive ER 95%, PR 95%, HER2 1+, Ki-67 20% focally 50%   09/02/2021 -  Anti-estrogen oral therapy   Verzinio with Zoladex and Letrozole     CHIEF COMPLIANT:  Follow-up of right breast cancer on Zoladex and Letrozole   INTERVAL HISTORY: Shannon Hale is a 42 y.o. with above-mentioned history of right breast cancer having undergone bilateral mastectomies. Currently on Zoladex and letrozole Verzinio. She presents to the clinic today for follow-up. She reports that the injections is going ok. She does get diarrhea and some nausea. Some mild hot flashes and complains of joints being sore. She says especially the knees.   ALLERGIES:  has No Known Allergies.  MEDICATIONS:  Current Outpatient Medications  Medication Sig Dispense Refill   abemaciclib (VERZENIO) 150 MG tablet Take 1 tablet (150 mg total) by mouth 2 (two) times daily. 56 tablet 3   AMBULATORY NON FORMULARY MEDICATION Eg Cg Boost supplement     ascorbic acid (VITAMIN C) 1000 MG tablet Take by mouth.     augmented betamethasone dipropionate (DIPROLENE) 0.05 % ointment Dry off Mucosa. Apply to a cotton tip then to the ulcer 3 to 4 times daily. 15 g 0   chlorhexidine (PERIDEX) 0.12 % solution IRRIGATE AREA TWICE A DAY FOR 2 WEEKS WITH HALF A SYRINGE FULL OF SOLUTION. SPIT OUT EXCESS, DO NOT SWALLOW. 473 mL 0   famotidine (PEPCID) 10 MG tablet 10 mg daily as needed for heartburn or indigestion.     l-methylfolate-B6-B12 (METANX) 3-35-2 MG TABS tablet Take 1 tablet by mouth daily.     letrozole (FEMARA) 2.5 MG tablet Take 1 tablet (2.5 mg total) by mouth daily. 90 tablet 3   ondansetron (ZOFRAN) 4 MG tablet Take 1 tablet (4 mg total) by mouth every 8 (eight) hours as needed for nausea or vomiting.  30 tablet 6   Turmeric 450 MG CAPS Take by mouth.     vitamin E 180 MG (400 UNITS) capsule Take 400 Units by mouth daily.     APPLE CIDER VINEGAR PO Take 450 mg by mouth. (Patient not taking: Reported on 10/17/2021)     Cholecalciferol (VITAMIN D-3) 125 MCG (5000 UT) TABS Take 125 mcg by mouth. (Patient not taking: Reported on 07/24/2022)     Rimegepant Sulfate (NURTEC) 75 MG TBDP Use one tablet daily  as needed for migraine (Patient not taking: Reported on 10/17/2021) 5 tablet 1   No current facility-administered medications for this visit.    PHYSICAL EXAMINATION: ECOG PERFORMANCE STATUS: 1 - Symptomatic but completely ambulatory  Vitals:   02/06/23 1521  BP: 124/74  Pulse: 88  Resp: 18  Temp: (!) 97.5 F (36.4 C)  SpO2: 96%   Filed Weights   02/06/23 1521  Weight: 222 lb 11.2 oz (101 kg)      LABORATORY DATA:  I have reviewed the data as listed    Latest Ref Rng & Units 02/06/2023    3:09 PM 11/13/2022    1:24 PM 09/18/2022    2:41 PM  CMP  Glucose 70 - 99 mg/dL 409  90  95   BUN 6 - 20 mg/dL 11  9  12    Creatinine 0.44 - 1.00 mg/dL 8.11  9.14  7.82   Sodium 135 - 145 mmol/L 138  138  137   Potassium 3.5 - 5.1 mmol/L 4.6  4.1  4.2   Chloride 98 - 111 mmol/L 102  103  103   CO2 22 - 32 mmol/L 29  28  27    Calcium 8.9 - 10.3 mg/dL 95.6  21.3  9.9   Total Protein 6.5 - 8.1 g/dL 8.4  8.2  8.2   Total Bilirubin 0.3 - 1.2 mg/dL 0.4  0.4  0.2   Alkaline Phos 38 - 126 U/L 131  110  84   AST 15 - 41 U/L 27  18  22    ALT 0 - 44 U/L 30  13  19      Lab Results  Component Value Date   WBC 5.5 02/06/2023   HGB 12.9 02/06/2023   HCT 36.8 02/06/2023   MCV 85.8 02/06/2023   PLT 338 02/06/2023   NEUTROABS 2.8 02/06/2023    ASSESSMENT & PLAN:  Carcinoma of overlapping sites of right breast in female, estrogen receptor positive (HCC) 03/14/2021: Patient palpated a lump in the retroareolar right breast with nipple retraction.  Mammogram revealed right breast mass 2.6 cm and a single right axillary lymph node measuring 1.2 cm.  (PET/CT negative) breast MRI revealed 4.2 cm right retroareolar mass with non-mass enhancement total measuring 14 cm, intraparenchymal lymph node 1.1 cm.  2 abnormal right axillary lymph nodes, biopsy revealed grade 2 IDC with DCIS ER 95%, PR 95%, Ki-67 20% focally 50%, HER2 negative, lymph node positive   PET CT scan: Negative for distant metastatic  disease MammaPrint: High risk   Treatment plan: 1.  Bilateral mastectomies 06/06/2021: [facility]: Left mastectomy: Grade 2 IDC 0.3 cm, margins negative Right mastectomy: Grade 3 IDC with DCIS 7.5 cm, margins negative, 4/4 lymph nodes positive ER 95%, PR 95%, HER2 1+, Ki-67 20% focally 50% 2.  adjuvant chemotherapy with dose dense Adriamycin and Cytoxan x4 followed by Taxol x12 (patient refused chemo and radiation) 3. Adjuvant antiestrogen therapy with AI plus ovarian suppression plus or minus abemaciclib versus tamoxifen with or  without abemaciclib ------------------------------------------------------------------------------------------------------------------------------------ Current treatment: Zoladex with letrozole and abemaciclib started 08/22/2021   Abemaciclib toxicities: Current dosing: 150 mg p.o. twice daily No significant diarrhea Tolerating it extremely well.   She comes monthly for injections.  We discussed with the patient about oophorectomy as an option to avoid getting monthly injections.  She is open to it and will discuss with her gynecologist.   Abdominal pain: CT CAP 03/03/22: No evidence of Met disease, 1.1 cm seroma rt Axilla Bone density 05/21/2022: Z-score -0.1: Normal   We will set her up for [facility] minimal residual disease testing.   No orders of the defined types were placed in this encounter.  The patient has a good understanding of the overall plan. she agrees with it. she will call with any problems that may develop before the next visit here. Total time spent: 30 mins including face to face time and time spent for planning, charting and co-ordination of care   Tamsen Meek, MD 02/06/23    I Janan Ridge am acting as a Neurosurgeon for [employer]  I have reviewed the above documentation for accuracy and completeness, and I agree with the above.

## 2023-02-05 ENCOUNTER — Other Ambulatory Visit: Payer: Self-pay | Admitting: *Deleted

## 2023-02-05 DIAGNOSIS — C50811 Malignant neoplasm of overlapping sites of right female breast: Secondary | ICD-10-CM

## 2023-02-06 ENCOUNTER — Inpatient Hospital Stay: Payer: Commercial Managed Care - PPO | Attending: Hematology and Oncology

## 2023-02-06 ENCOUNTER — Inpatient Hospital Stay: Payer: Commercial Managed Care - PPO

## 2023-02-06 ENCOUNTER — Inpatient Hospital Stay (HOSPITAL_BASED_OUTPATIENT_CLINIC_OR_DEPARTMENT_OTHER): Payer: Commercial Managed Care - PPO | Admitting: Hematology and Oncology

## 2023-02-06 ENCOUNTER — Other Ambulatory Visit: Payer: Self-pay

## 2023-02-06 VITALS — BP 124/74 | HR 88 | Temp 97.5°F | Resp 18 | Ht 69.0 in | Wt 222.7 lb

## 2023-02-06 DIAGNOSIS — C50811 Malignant neoplasm of overlapping sites of right female breast: Secondary | ICD-10-CM | POA: Diagnosis not present

## 2023-02-06 DIAGNOSIS — Z79899 Other long term (current) drug therapy: Secondary | ICD-10-CM | POA: Insufficient documentation

## 2023-02-06 DIAGNOSIS — Z170 Estrogen receptor positive status [ER+]: Secondary | ICD-10-CM | POA: Diagnosis not present

## 2023-02-06 DIAGNOSIS — Z5111 Encounter for antineoplastic chemotherapy: Secondary | ICD-10-CM | POA: Insufficient documentation

## 2023-02-06 LAB — CBC WITH DIFFERENTIAL (CANCER CENTER ONLY)
Abs Immature Granulocytes: 0.01 10*3/uL (ref 0.00–0.07)
Basophils Absolute: 0.1 10*3/uL (ref 0.0–0.1)
Basophils Relative: 1 %
Eosinophils Absolute: 0.1 10*3/uL (ref 0.0–0.5)
Eosinophils Relative: 2 %
HCT: 36.8 % (ref 36.0–46.0)
Hemoglobin: 12.9 g/dL (ref 12.0–15.0)
Immature Granulocytes: 0 %
Lymphocytes Relative: 35 %
Lymphs Abs: 1.9 10*3/uL (ref 0.7–4.0)
MCH: 30.1 pg (ref 26.0–34.0)
MCHC: 35.1 g/dL (ref 30.0–36.0)
MCV: 85.8 fL (ref 80.0–100.0)
Monocytes Absolute: 0.6 10*3/uL (ref 0.1–1.0)
Monocytes Relative: 11 %
Neutro Abs: 2.8 10*3/uL (ref 1.7–7.7)
Neutrophils Relative %: 51 %
Platelet Count: 338 10*3/uL (ref 150–400)
RBC: 4.29 MIL/uL (ref 3.87–5.11)
RDW: 12.4 % (ref 11.5–15.5)
WBC Count: 5.5 10*3/uL (ref 4.0–10.5)
nRBC: 0 % (ref 0.0–0.2)

## 2023-02-06 LAB — CMP (CANCER CENTER ONLY)
ALT: 30 U/L (ref 0–44)
AST: 27 U/L (ref 15–41)
Albumin: 4.8 g/dL (ref 3.5–5.0)
Alkaline Phosphatase: 131 U/L — ABNORMAL HIGH (ref 38–126)
Anion gap: 7 (ref 5–15)
BUN: 11 mg/dL (ref 6–20)
CO2: 29 mmol/L (ref 22–32)
Calcium: 10.6 mg/dL — ABNORMAL HIGH (ref 8.9–10.3)
Chloride: 102 mmol/L (ref 98–111)
Creatinine: 1.19 mg/dL — ABNORMAL HIGH (ref 0.44–1.00)
GFR, Estimated: 59 mL/min — ABNORMAL LOW (ref >60)
Glucose, Bld: 107 mg/dL — ABNORMAL HIGH (ref 70–99)
Potassium: 4.6 mmol/L (ref 3.5–5.1)
Sodium: 138 mmol/L (ref 135–145)
Total Bilirubin: 0.4 mg/dL (ref 0.3–1.2)
Total Protein: 8.4 g/dL — ABNORMAL HIGH (ref 6.5–8.1)

## 2023-02-06 MED ORDER — GOSERELIN ACETATE 3.6 MG ~~LOC~~ IMPL
3.6000 mg | DRUG_IMPLANT | Freq: Once | SUBCUTANEOUS | Status: AC
  Administered 2023-02-06: 3.6 mg via SUBCUTANEOUS
  Filled 2023-02-06: qty 3.6

## 2023-02-06 NOTE — Assessment & Plan Note (Signed)
03/14/2021: Patient palpated a lump in the retroareolar right breast with nipple retraction.  Mammogram revealed right breast mass 2.6 cm and a single right axillary lymph node measuring 1.2 cm.  (PET/CT negative) breast MRI revealed 4.2 cm right retroareolar mass with non-mass enhancement total measuring 14 cm, intraparenchymal lymph node 1.1 cm.  2 abnormal right axillary lymph nodes, biopsy revealed grade 2 IDC with DCIS ER 95%, PR 95%, Ki-67 20% focally 50%, HER2 negative, lymph node positive   PET CT scan: Negative for distant metastatic disease MammaPrint: High risk   Treatment plan: 1.  Bilateral mastectomies 06/06/2021: [facility]: Left mastectomy: Grade 2 IDC 0.3 cm, margins negative Right mastectomy: Grade 3 IDC with DCIS 7.5 cm, margins negative, 4/4 lymph nodes positive ER 95%, PR 95%, HER2 1+, Ki-67 20% focally 50% 2.  adjuvant chemotherapy with dose dense Adriamycin and Cytoxan x4 followed by Taxol x12 (patient refused chemo and radiation) 3. Adjuvant antiestrogen therapy with AI plus ovarian suppression plus or minus abemaciclib versus tamoxifen with or without abemaciclib ------------------------------------------------------------------------------------------------------------------------------------ Current treatment: Zoladex with letrozole and abemaciclib started 08/22/2021   Abemaciclib toxicities: Current dosing: 150 mg p.o. twice daily No significant diarrhea Tolerating it extremely well.   She comes monthly for injections.  We discussed with the patient about oophorectomy as an option to avoid getting monthly injections.  She is open to it and will discuss with her gynecologist.   Abdominal pain: CT CAP 03/03/22: No evidence of Met disease, 1.1 cm seroma rt Axilla Bone density 05/21/2022: Z-score -0.1: Normal  

## 2023-02-10 ENCOUNTER — Telehealth: Payer: Self-pay

## 2023-02-10 NOTE — Telephone Encounter (Signed)
Per md orders entered for [facility]. Requisition and all supported documents faxed to [phone]. Faxed confirmation was received.  

## 2023-02-14 ENCOUNTER — Other Ambulatory Visit (HOSPITAL_COMMUNITY): Payer: Self-pay

## 2023-02-17 ENCOUNTER — Other Ambulatory Visit: Payer: Self-pay

## 2023-02-17 ENCOUNTER — Other Ambulatory Visit: Payer: Self-pay | Admitting: Hematology and Oncology

## 2023-02-17 ENCOUNTER — Other Ambulatory Visit (HOSPITAL_COMMUNITY): Payer: Self-pay

## 2023-02-17 DIAGNOSIS — Z853 Personal history of malignant neoplasm of breast: Secondary | ICD-10-CM | POA: Diagnosis not present

## 2023-02-17 MED ORDER — ABEMACICLIB 150 MG PO TABS
150.0000 mg | ORAL_TABLET | Freq: Two times a day (BID) | ORAL | 3 refills | Status: DC
  Filled 2023-02-17: qty 56, 28d supply, fill #0
  Filled 2023-03-14: qty 56, 28d supply, fill #1
  Filled 2023-04-10: qty 56, 28d supply, fill #2
  Filled 2023-05-07: qty 56, 28d supply, fill #3

## 2023-02-18 ENCOUNTER — Telehealth: Payer: Self-pay | Admitting: Hematology and Oncology

## 2023-02-18 NOTE — Telephone Encounter (Signed)
Scheduled appointments per los. Left voicemail. 

## 2023-02-19 ENCOUNTER — Other Ambulatory Visit (HOSPITAL_COMMUNITY): Payer: Self-pay

## 2023-02-24 ENCOUNTER — Ambulatory Visit: Payer: Commercial Managed Care - PPO | Attending: General Surgery

## 2023-02-24 ENCOUNTER — Telehealth: Payer: Self-pay | Admitting: Pharmacist

## 2023-02-24 VITALS — Wt 221.5 lb

## 2023-02-24 DIAGNOSIS — Z483 Aftercare following surgery for neoplasm: Secondary | ICD-10-CM | POA: Insufficient documentation

## 2023-02-24 NOTE — Therapy (Signed)
OUTPATIENT PHYSICAL THERAPY SOZO SCREENING NOTE   Patient Name: Shannon Hale MRN: [id number] DOB:[birth year], 42 y.o., female Today's Date: 02/24/2023  PCP: Pearline Cables, MD REFERRING PROVIDER: Emelia Loron, MD   PT End of Session - 02/24/23 1655     Visit Number 4   # unchanged due to screen only   PT Start Time 1653    PT Stop Time 1657    PT Time Calculation (min) 4 min    Activity Tolerance Patient tolerated treatment well    Behavior During Therapy [facility] for tasks assessed/performed             Past Medical History:  Diagnosis Date   Abnormal Pap smear    Headache(784.0)    migraines   Hepatitis 03/2004   unknown form of hepatitis, no current liver problems   History of chicken pox    History of D&C    D&E   Migraine    Nausea    PONV (postoperative nausea and vomiting)    Vaginal Pap smear, abnormal    Past Surgical History:  Procedure Laterality Date   BREAST RECONSTRUCTION Bilateral 06/06/2021   Procedure: BILATERAL BREAST RECONSTRUCTION WITH TISSUE REARRANGEMENT;  Surgeon: Peggye Form, DO;  Location: [facility];  Service: Plastics;  Laterality: Bilateral;   CHOLECYSTECTOMY N/A 12/01/2013   Procedure: LAPAROSCOPIC CHOLECYSTECTOMY ;  Surgeon: Shelly Rubenstein, MD;  Location: [facility];  Service: General;  Laterality: N/A;   DILATION AND EVACUATION N/A 07/14/2014   Procedure: DILATATION AND EVACUATION;  Surgeon: Zelphia Cairo, MD;  Location: [facility];  Service: Gynecology;  Laterality: N/A;   MASTECTOMY W/ SENTINEL NODE BIOPSY Right 06/06/2021   Procedure: RIGHT MASTECTOMY WITH AXILLARY SENTINEL LYMPH NODE BIOPSY;  Surgeon: Emelia Loron, MD;  Location: [facility];  Service: General;  Laterality: Right;   RADIOACTIVE SEED GUIDED AXILLARY SENTINEL LYMPH NODE Right 06/06/2021   Procedure: RADIOACTIVE SEED GUIDED RIGHT AXILLARY SENTINEL LYMPH NODE EXCISION;  Surgeon: Emelia Loron, MD;  Location: [facility];  Service: General;  Laterality: Right;   SEPTOPLASTY     TONSILLECTOMY     TOTAL MASTECTOMY Left 06/06/2021   Procedure: LEFT TOTAL MASTECTOMY;  Surgeon: Emelia Loron, MD;  Location: [facility];  Service: General;  Laterality: Left;   WISDOM TOOTH EXTRACTION     Patient Active Problem List   Diagnosis Date Noted   S/P bilateral mastectomy 06/06/2021   Breast cancer (HCC) 03/27/2021   Carcinoma of overlapping sites of right breast in female, estrogen receptor positive (HCC) 03/22/2021   Headache 06/26/2020   Rash 06/26/2020   New daily persistent headache 05/21/2018   Disorder of ligament of left hand 07/20/2015   Obesity 09/25/2014   Dermatitis 12/31/2013   Cholelithiasis 02/29/2012   BACK PAIN, THORACIC REGION 11/19/2010   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 Bilateral mastectomy with no reconstruction 06/06/21. No chemotherapy. Still thinking about radiation.  4/4 nodes positive on the Rt.  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  She was told in the past that she has "scapular rotation" on the right   PRECAUTIONS: right UE Lymphedema risk, None  SUBJECTIVE: Pt returns for her 3 month L-Dex screen.   PAIN:  Are you having pain?  No  SOZO SCREENING: Patient was assessed today using the SOZO machine to determine the lymphedema index score. This was compared to her baseline score. It was determined that she is within the recommended range when compared to her baseline and no further action is needed at this time. She will continue SOZO screenings. These are done every 3 months for 2 years post operatively followed by every 6 months for 2 years, and then annually.   L-DEX FLOWSHEETS - 02/24/23 1600        L-DEX LYMPHEDEMA SCREENING   Measurement Type Unilateral    L-DEX MEASUREMENT EXTREMITY Upper Extremity    POSITION  Standing    DOMINANT SIDE Right    At Risk Side Right    BASELINE SCORE (UNILATERAL) -3.3    L-DEX SCORE (UNILATERAL) -2.5    VALUE CHANGE (UNILAT) 0.8            P: Begin 6 month L-Dex screens after next.   Hermenia Bers, PTA 02/24/2023, 4:58 PM

## 2023-02-27 ENCOUNTER — Telehealth: Payer: Commercial Managed Care - PPO | Admitting: Physician Assistant

## 2023-02-27 ENCOUNTER — Other Ambulatory Visit: Payer: Self-pay

## 2023-02-27 DIAGNOSIS — C50811 Malignant neoplasm of overlapping sites of right female breast: Secondary | ICD-10-CM | POA: Diagnosis not present

## 2023-02-27 DIAGNOSIS — R3989 Other symptoms and signs involving the genitourinary system: Secondary | ICD-10-CM | POA: Diagnosis not present

## 2023-02-27 DIAGNOSIS — Z170 Estrogen receptor positive status [ER+]: Secondary | ICD-10-CM | POA: Diagnosis not present

## 2023-02-27 MED ORDER — NITROFURANTOIN MONOHYD MACRO 100 MG PO CAPS
100.0000 mg | ORAL_CAPSULE | Freq: Two times a day (BID) | ORAL | 0 refills | Status: DC
Start: 2023-02-27 — End: 2023-05-06
  Filled 2023-02-27: qty 10, 5d supply, fill #0

## 2023-02-27 NOTE — Progress Notes (Signed)
E-Visit for Urinary Problems  We are sorry that you are not feeling well.  Here is how we plan to help!  Based on what you shared with me it looks like you most likely have a simple urinary tract infection.  A UTI (Urinary Tract Infection) is a bacterial infection of the bladder.  Most cases of urinary tract infections are simple to treat but a key part of your care is to encourage you to drink plenty of fluids and watch your symptoms carefully.  I have prescribed MacroBid 100 mg twice a day for 5 days.  Your symptoms should gradually improve. Call us if the burning in your urine worsens, you develop worsening fever, back pain or pelvic pain or if your symptoms do not resolve after completing the antibiotic.  Urinary tract infections can be prevented by drinking plenty of water to keep your body hydrated.  Also be sure when you wipe, wipe from front to back and don't hold it in!  If possible, empty your bladder every 4 hours.  HOME CARE Drink plenty of fluids Compete the full course of the antibiotics even if the symptoms resolve Remember, when you need to go.go. Holding in your urine can increase the likelihood of getting a UTI! GET HELP RIGHT AWAY IF: You cannot urinate You get a high fever Worsening back pain occurs You see blood in your urine You feel sick to your stomach or throw up You feel like you are going to pass out  MAKE SURE YOU  Understand these instructions. Will watch your condition. Will get help right away if you are not doing well or get worse.   Thank you for choosing an e-visit.  Your e-visit answers were reviewed by a board certified advanced clinical practitioner to complete your personal care plan. Depending upon the condition, your plan could have included both over the counter or prescription medications.  Please review your pharmacy choice. Make sure the pharmacy is open so you can pick up prescription now. If there is a problem, you may contact your  provider through MyChart messaging and have the prescription routed to another pharmacy.  Your safety is important to us. If you have drug allergies check your prescription carefully.   For the next 24 hours you can use MyChart to ask questions about today's visit, request a non-urgent call back, or ask for a work or school excuse. You will get an email in the next two days asking about your experience. I hope that your e-visit has been valuable and will speed your recovery.  I have spent 5 minutes in review of e-visit questionnaire, review and updating patient chart, medical decision making and response to patient.   Jomo Forand M Khyrin Trevathan, PA-C  

## 2023-03-07 ENCOUNTER — Inpatient Hospital Stay: Payer: Commercial Managed Care - PPO

## 2023-03-10 ENCOUNTER — Inpatient Hospital Stay: Payer: Commercial Managed Care - PPO | Attending: Hematology and Oncology

## 2023-03-10 VITALS — BP 108/70 | HR 82 | Temp 98.8°F | Resp 18

## 2023-03-10 DIAGNOSIS — C50811 Malignant neoplasm of overlapping sites of right female breast: Secondary | ICD-10-CM | POA: Insufficient documentation

## 2023-03-10 DIAGNOSIS — Z5111 Encounter for antineoplastic chemotherapy: Secondary | ICD-10-CM | POA: Diagnosis not present

## 2023-03-10 DIAGNOSIS — Z79899 Other long term (current) drug therapy: Secondary | ICD-10-CM | POA: Insufficient documentation

## 2023-03-10 DIAGNOSIS — Z170 Estrogen receptor positive status [ER+]: Secondary | ICD-10-CM

## 2023-03-10 MED ORDER — GOSERELIN ACETATE 3.6 MG ~~LOC~~ IMPL
3.6000 mg | DRUG_IMPLANT | Freq: Once | SUBCUTANEOUS | Status: AC
  Administered 2023-03-10: 3.6 mg via SUBCUTANEOUS
  Filled 2023-03-10: qty 3.6

## 2023-03-14 ENCOUNTER — Other Ambulatory Visit (HOSPITAL_COMMUNITY): Payer: Self-pay

## 2023-03-19 ENCOUNTER — Other Ambulatory Visit (HOSPITAL_COMMUNITY): Payer: Self-pay

## 2023-03-20 LAB — SIGNATERA ONLY (NATERA MANAGED)
SIGNATERA MTM READOUT: 0 MTM/ml
SIGNATERA TEST RESULT: NEGATIVE

## 2023-03-24 ENCOUNTER — Encounter: Payer: Self-pay | Admitting: Hematology and Oncology

## 2023-03-24 NOTE — Telephone Encounter (Signed)
Called pt per MD to advise [employer] testing was negative/not detected. Pt verbalized understanding of results and knows [facility] will be in touch to schedule 3 mo repeat lab.   

## 2023-03-25 ENCOUNTER — Encounter (HOSPITAL_COMMUNITY): Payer: Self-pay

## 2023-04-03 ENCOUNTER — Inpatient Hospital Stay: Payer: Commercial Managed Care - PPO

## 2023-04-04 ENCOUNTER — Inpatient Hospital Stay: Payer: Commercial Managed Care - PPO

## 2023-04-07 ENCOUNTER — Other Ambulatory Visit: Payer: Self-pay

## 2023-04-07 ENCOUNTER — Inpatient Hospital Stay: Payer: Commercial Managed Care - PPO | Attending: Hematology and Oncology

## 2023-04-07 VITALS — BP 124/74 | HR 80 | Temp 98.9°F | Resp 18

## 2023-04-07 DIAGNOSIS — C50811 Malignant neoplasm of overlapping sites of right female breast: Secondary | ICD-10-CM | POA: Diagnosis not present

## 2023-04-07 DIAGNOSIS — Z79899 Other long term (current) drug therapy: Secondary | ICD-10-CM | POA: Diagnosis not present

## 2023-04-07 DIAGNOSIS — Z5111 Encounter for antineoplastic chemotherapy: Secondary | ICD-10-CM | POA: Diagnosis not present

## 2023-04-07 MED ORDER — GOSERELIN ACETATE 3.6 MG ~~LOC~~ IMPL
3.6000 mg | DRUG_IMPLANT | Freq: Once | SUBCUTANEOUS | Status: AC
  Administered 2023-04-07: 3.6 mg via SUBCUTANEOUS
  Filled 2023-04-07: qty 3.6

## 2023-04-09 ENCOUNTER — Other Ambulatory Visit: Payer: Self-pay

## 2023-04-10 ENCOUNTER — Other Ambulatory Visit (HOSPITAL_COMMUNITY): Payer: Self-pay

## 2023-04-14 IMAGING — MG MM BREAST LOCALIZATION CLIP
4 series · 4 of 12 positions shown · non-contrast
Comparison: Previous exam(s).

CLINICAL DATA: Post procedure mammogram for clip placement.

EXAM:
3D DIAGNOSTIC RIGHT MAMMOGRAM POST ULTRASOUND BIOPSY

[R CC synth-2D]
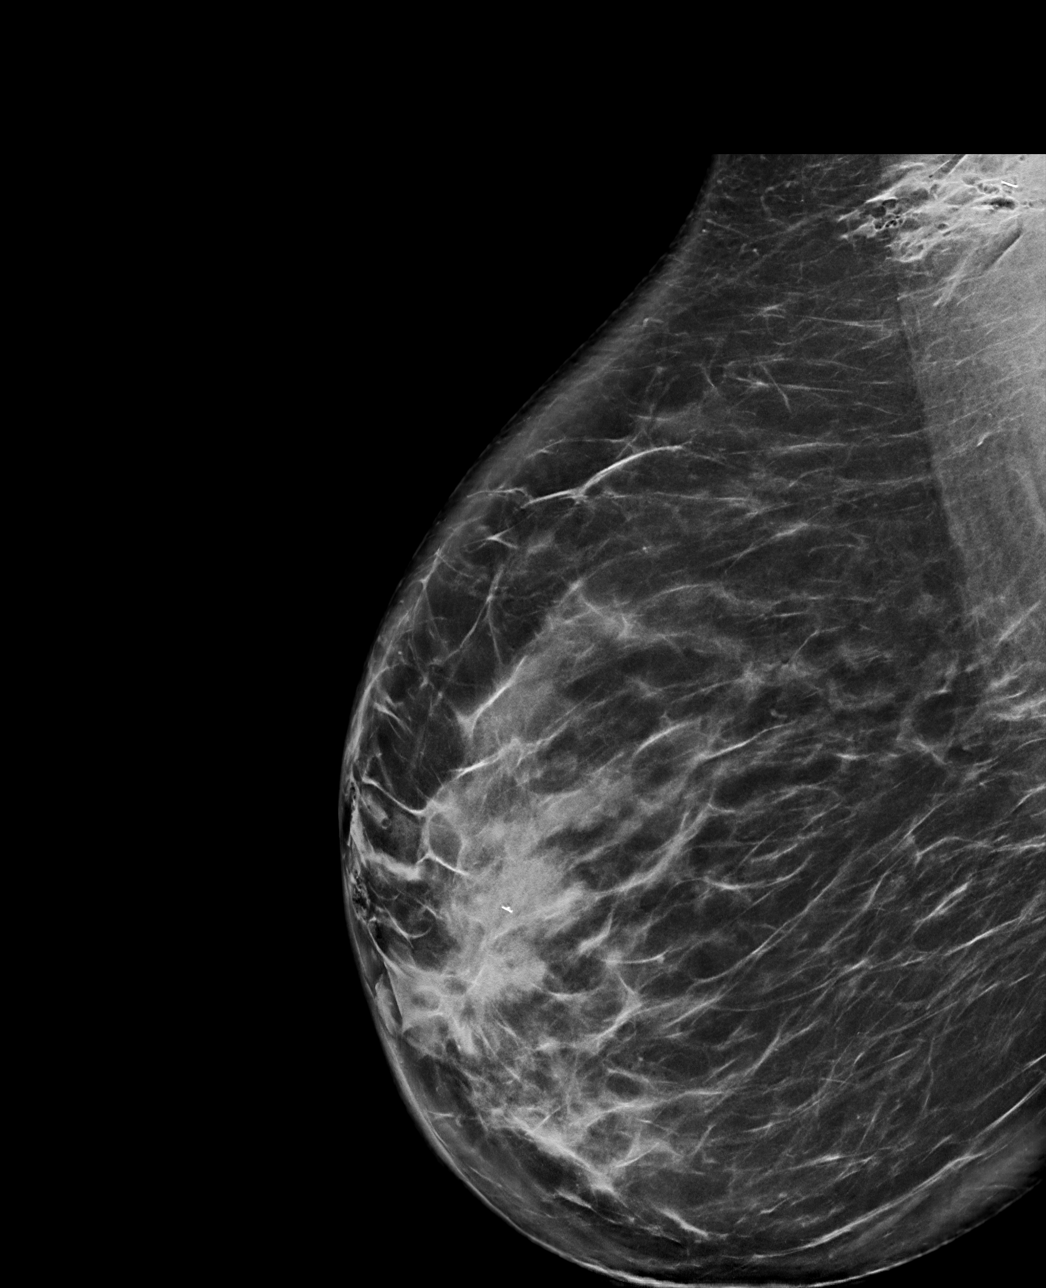

[R LM synth-2D]
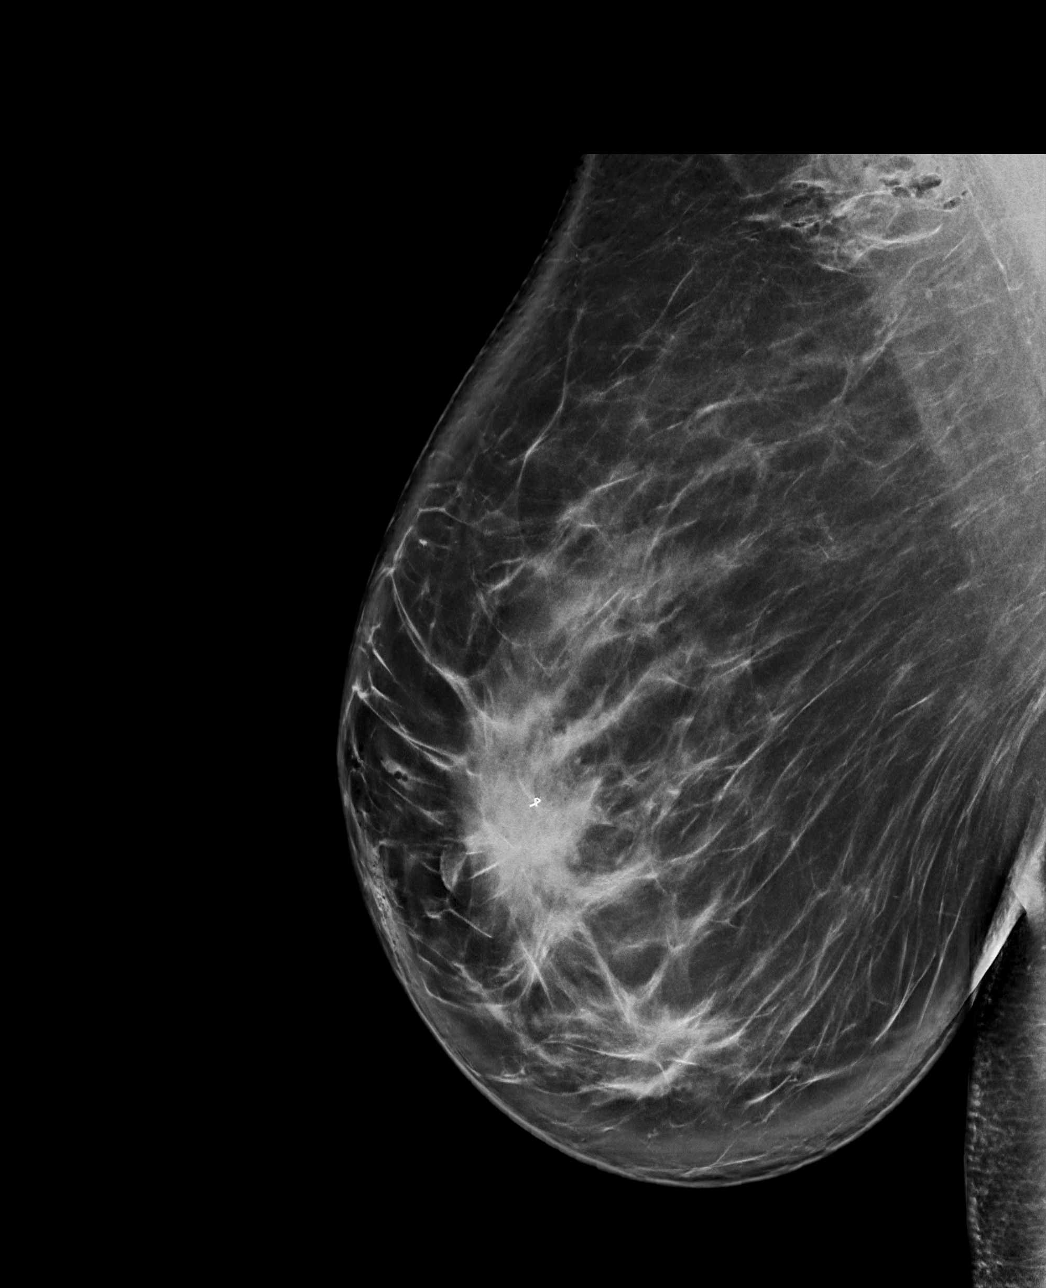

[R LM tomo · tomo slice 55/109.0]
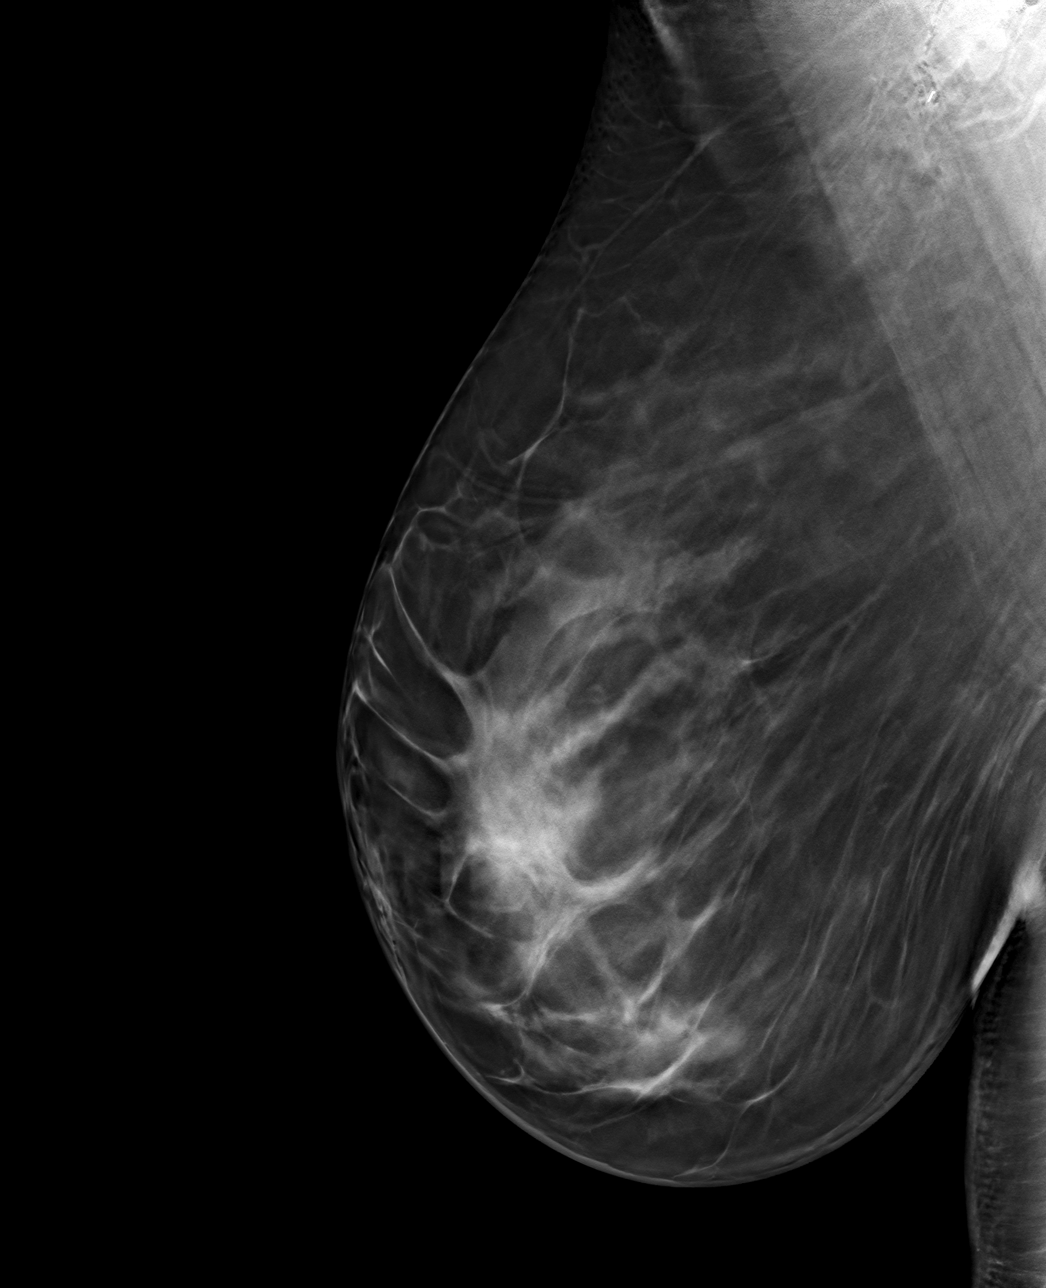

[R CC tomo · tomo slice 49/98.0]
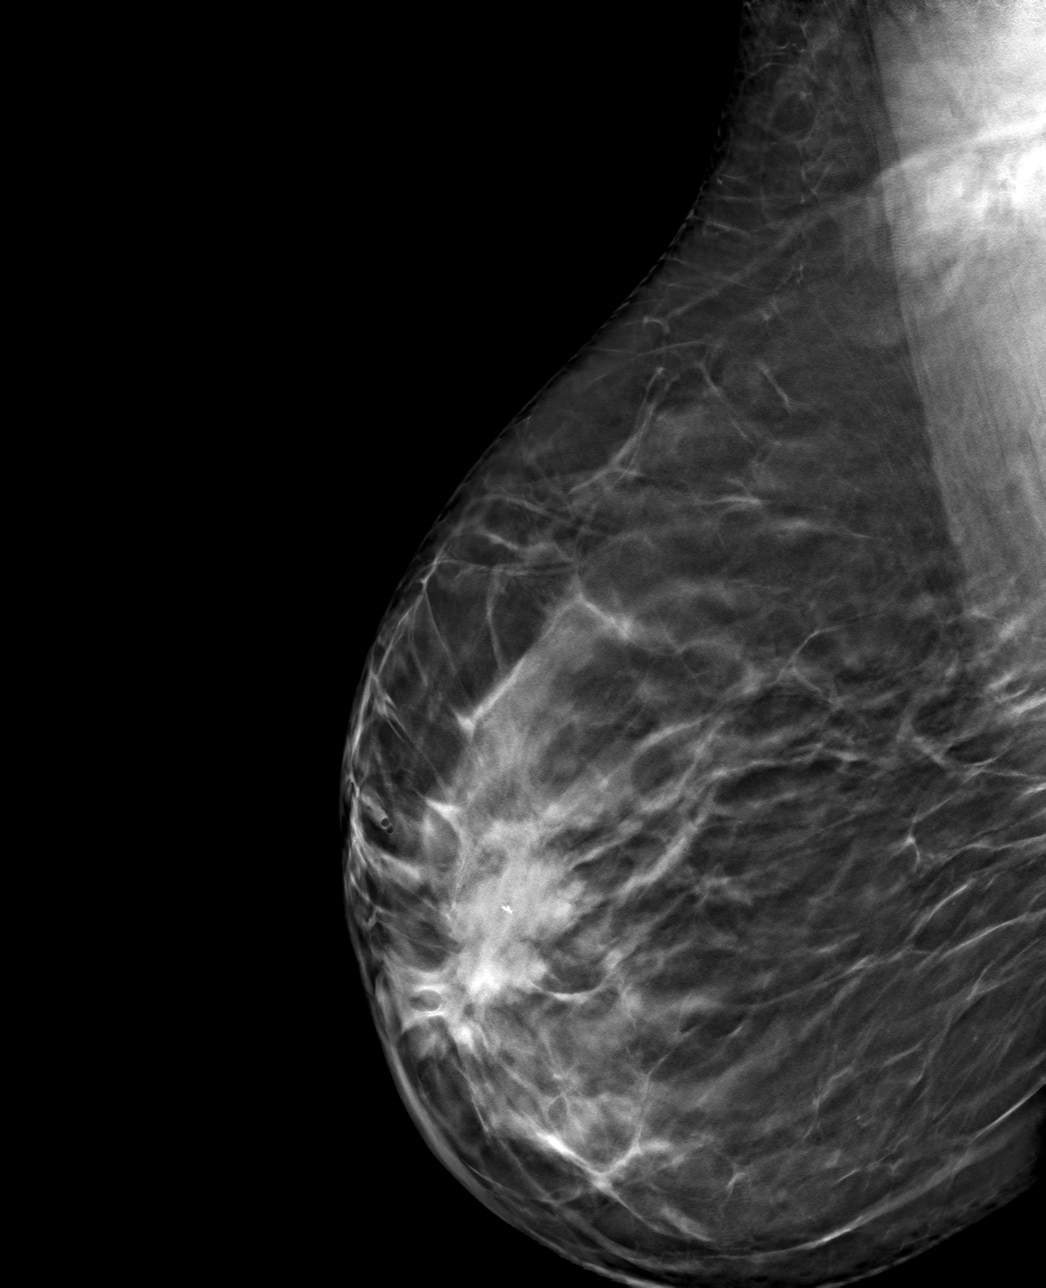

[4 of 12 positions shown; findings below may reference images not displayed]

FINDINGS: 3D Mammographic images were obtained following ultrasound guided
biopsy of a mass in the right breast at 10 o'clock retroareolar. The
ribbon biopsy marking clip is in expected position at the site of
biopsy.

3D Mammographic images were obtained following ultrasound guided
biopsy of a mass in the right axilla. The tribell biopsy marking
clip is in expected position at the site of biopsy.
IMPRESSION: Appropriate positioning of the ribbon shaped biopsy marking clip at
the site of biopsy in the right breast at 10 o'clock retroareolar.

Appropriate positioning of the tribell shaped biopsy marking clip at
the site of biopsy in the right axilla.

Final Assessment: Post Procedure Mammograms for Marker Placement

## 2023-04-15 ENCOUNTER — Other Ambulatory Visit: Payer: Self-pay

## 2023-04-16 ENCOUNTER — Other Ambulatory Visit: Payer: Self-pay

## 2023-04-16 ENCOUNTER — Other Ambulatory Visit (HOSPITAL_COMMUNITY): Payer: Self-pay

## 2023-04-17 ENCOUNTER — Other Ambulatory Visit: Payer: Self-pay

## 2023-05-01 ENCOUNTER — Inpatient Hospital Stay: Payer: Commercial Managed Care - PPO | Admitting: Pharmacist

## 2023-05-01 ENCOUNTER — Inpatient Hospital Stay: Payer: Commercial Managed Care - PPO

## 2023-05-06 ENCOUNTER — Inpatient Hospital Stay: Payer: Commercial Managed Care - PPO

## 2023-05-06 ENCOUNTER — Other Ambulatory Visit: Payer: Self-pay

## 2023-05-06 ENCOUNTER — Inpatient Hospital Stay: Payer: Commercial Managed Care - PPO | Attending: Hematology and Oncology

## 2023-05-06 ENCOUNTER — Inpatient Hospital Stay: Payer: Commercial Managed Care - PPO | Admitting: Pharmacist

## 2023-05-06 VITALS — BP 106/69 | HR 81 | Temp 97.9°F | Resp 18 | Ht 69.0 in | Wt 228.6 lb

## 2023-05-06 DIAGNOSIS — Z79899 Other long term (current) drug therapy: Secondary | ICD-10-CM | POA: Insufficient documentation

## 2023-05-06 DIAGNOSIS — Z5111 Encounter for antineoplastic chemotherapy: Secondary | ICD-10-CM | POA: Diagnosis present

## 2023-05-06 DIAGNOSIS — C50811 Malignant neoplasm of overlapping sites of right female breast: Secondary | ICD-10-CM | POA: Diagnosis present

## 2023-05-06 LAB — CBC WITH DIFFERENTIAL (CANCER CENTER ONLY)
Abs Immature Granulocytes: 0.02 10*3/uL (ref 0.00–0.07)
Basophils Absolute: 0.1 10*3/uL (ref 0.0–0.1)
Basophils Relative: 1 %
Eosinophils Absolute: 0.1 10*3/uL (ref 0.0–0.5)
Eosinophils Relative: 2 %
HCT: 37.2 % (ref 36.0–46.0)
Hemoglobin: 13.2 g/dL (ref 12.0–15.0)
Immature Granulocytes: 0 %
Lymphocytes Relative: 34 %
Lymphs Abs: 2 10*3/uL (ref 0.7–4.0)
MCH: 30.1 pg (ref 26.0–34.0)
MCHC: 35.5 g/dL (ref 30.0–36.0)
MCV: 84.9 fL (ref 80.0–100.0)
Monocytes Absolute: 0.5 10*3/uL (ref 0.1–1.0)
Monocytes Relative: 8 %
Neutro Abs: 3.2 10*3/uL (ref 1.7–7.7)
Neutrophils Relative %: 55 %
Platelet Count: 274 10*3/uL (ref 150–400)
RBC: 4.38 MIL/uL (ref 3.87–5.11)
RDW: 12.1 % (ref 11.5–15.5)
WBC Count: 5.8 10*3/uL (ref 4.0–10.5)
nRBC: 0 % (ref 0.0–0.2)

## 2023-05-06 LAB — CMP (CANCER CENTER ONLY)
ALT: 16 U/L (ref 0–44)
AST: 22 U/L (ref 15–41)
Albumin: 4.6 g/dL (ref 3.5–5.0)
Alkaline Phosphatase: 113 U/L (ref 38–126)
Anion gap: 7 (ref 5–15)
BUN: 13 mg/dL (ref 6–20)
CO2: 28 mmol/L (ref 22–32)
Calcium: 10 mg/dL (ref 8.9–10.3)
Chloride: 103 mmol/L (ref 98–111)
Creatinine: 1.21 mg/dL — ABNORMAL HIGH (ref 0.44–1.00)
GFR, Estimated: 58 mL/min — ABNORMAL LOW (ref >60)
Glucose, Bld: 89 mg/dL (ref 70–99)
Potassium: 4.2 mmol/L (ref 3.5–5.1)
Sodium: 138 mmol/L (ref 135–145)
Total Bilirubin: 0.4 mg/dL (ref 0.3–1.2)
Total Protein: 7.9 g/dL (ref 6.5–8.1)

## 2023-05-06 MED ORDER — VENLAFAXINE HCL ER 37.5 MG PO CP24
37.5000 mg | ORAL_CAPSULE | Freq: Every day | ORAL | 5 refills | Status: AC
  Filled 2023-05-06: qty 30, 30d supply, fill #0

## 2023-05-06 MED ORDER — GOSERELIN ACETATE 3.6 MG ~~LOC~~ IMPL
3.6000 mg | DRUG_IMPLANT | Freq: Once | SUBCUTANEOUS | Status: AC
  Administered 2023-05-06: 3.6 mg via SUBCUTANEOUS
  Filled 2023-05-06: qty 3.6

## 2023-05-06 NOTE — Progress Notes (Signed)
[facility]       Telephone: [phone]?Fax: [phone]   Oncology Clinical Pharmacist Practitioner Progress Note  Shannon Hale was contacted via in-person to discuss her chemotherapy regimen for abemaciclib which they receive under the care of Dr. Serena Croissant.   Current treatment regimen and start date Abemaciclib (09/03/21) Goserelin (08/22/21) -- tentative last dose on 06/02/23 (BSO scheduled for 06/05/23) Letrozole (08/02/21)   Interval History She continues on abemaciclib 150 mg by mouth every 12 hours on days 1 to 28 of a 28-day cycle. This is being given in combination with letrozole and goserelin . Therapy is planned to continue until  2 years in the adjuvant setting per the monarchE trial data. She last saw Dr. Pamelia Hoit on 02/06/23 and clinical pharmacy on 11/13/22.  At her last visit with Dr. Pamelia Hoit, her calcium, alk phos, and serum creatinine were elevated. She had stopped taking vitamin D and continues off of it.   Response to Therapy Ms. Politte continues to do well on abemaciclib.  Her alkaline phosphatase and calcium have normalized.  Her serum creatinine remains slightly elevated above her baseline value of 1.17 mg/dL.  She was just recently in [place] and states that she probably was not getting enough fluids and during that time it was very humid.  She is not experiencing any signs of dehydration today.  She does have some joint pain that is likely due from letrozole and goserelin.  She also continues to have hot flashes.  She did ask if there was anything that she could potentially take for the symptoms we discussed a low-dose venlafaxine prescription with her today.  She is interested in trying this and we have sent this prescription to her pharmacy of choice.  As above, she is scheduled to have a BSO done on 06/05/23 and so she will receive goserelin today and again in 4 weeks.  We will put in that injection appointment today.  She will then see Dr. Pamelia Hoit with  labs in approximately 3 months.  She is due to stop abemaciclib on 09/03/23. Labs, vitals, treatment parameters, and manufacturer guidelines assessing toxicity were reviewed with Marcene Brawn today. Based on these values, patient is in agreement to continue abemaciclib therapy at this time.  Allergies No Known Allergies  Vitals    05/06/2023    2:06 PM 04/07/2023    3:55 PM 03/10/2023    3:45 PM  Oncology Vitals  Height 175 cm    Weight 103.692 kg    Weight (lbs) 228 lbs 10 oz    BMI 33.76 kg/m2    Temp 97.9 F (36.6 C) 98.9 F (37.2 C) 98.8 F (37.1 C)  Pulse Rate 81 80 82  BP 106/69 124/74 108/70  Resp 18 18 18   SpO2 100 % 96 % 98 %  BSA (m2) 2.25 m2      Laboratory Data    Latest Ref Rng & Units 05/06/2023    1:42 PM 02/06/2023    3:09 PM 11/13/2022    1:24 PM  CBC EXTENDED  WBC 4.0 - 10.5 K/uL 5.8  5.5  4.9   RBC 3.87 - 5.11 MIL/uL 4.38  4.29  4.22   Hemoglobin 12.0 - 15.0 g/dL 56.2  13.0  86.5   HCT 36.0 - 46.0 % 37.2  36.8  36.6   Platelets 150 - 400 K/uL 274  338  303   NEUT# 1.7 - 7.7 K/uL 3.2  2.8  2.7   Lymph#  0.7 - 4.0 K/uL 2.0  1.9  1.8        Latest Ref Rng & Units 05/06/2023    1:42 PM 02/06/2023    3:09 PM 11/13/2022    1:24 PM  CMP  Glucose 70 - 99 mg/dL 89  416  90   BUN 6 - 20 mg/dL 13  11  9    Creatinine 0.44 - 1.00 mg/dL 6.06  3.01  6.01   Sodium 135 - 145 mmol/L 138  138  138   Potassium 3.5 - 5.1 mmol/L 4.2  4.6  4.1   Chloride 98 - 111 mmol/L 103  102  103   CO2 22 - 32 mmol/L 28  29  28    Calcium 8.9 - 10.3 mg/dL 09.3  23.5  57.3   Total Protein 6.5 - 8.1 g/dL 7.9  8.4  8.2   Total Bilirubin 0.3 - 1.2 mg/dL 0.4  0.4  0.4   Alkaline Phos 38 - 126 U/L 113  131  110   AST 15 - 41 U/L 22  27  18    ALT 0 - 44 U/L 16  30  13      No results found for: "MG" Lab Results  Component Value Date   CA2729 29.3 03/20/2021     Adverse Effects Assessment Serum creatinine: continues to be slightly elevated above her baseline value.  We have encouraged her  to drink more noncaffeinated beverages. Calcium: Now WNL.  She will remain off vitamin D at this time Alkaline phosphatase: Now WNL.  Will monitor.  Adherence Assessment ZAYLA BUXBAUM reports missing 4 doses over the past 12 weeks.   Reason for missed dose: Was traveling and forgot Patient was re-educated on importance of adherence.   Access Assessment TYLEA MCKINNEY is currently receiving her abemaciclib through [facility] concerns:  none  Medication Reconciliation The patient's medication list was reviewed today with the patient? Yes New medications or herbal supplements have recently been started? No  Any medications have been discontinued?  Yes, we updated her med list since she has stopped taking several medications The medication list was updated and reconciled based on the patient's most recent medication list in the electronic medical record (EMR) including herbal products and OTC medications.   Medications Current Outpatient Medications  Medication Sig Dispense Refill   abemaciclib (VERZENIO) 150 MG tablet Take 1 tablet (150 mg total) by mouth 2 (two) times daily. 56 tablet 3   ascorbic acid (VITAMIN C) 1000 MG tablet Take by mouth.     letrozole (FEMARA) 2.5 MG tablet Take 1 tablet (2.5 mg total) by mouth daily. 90 tablet 3   venlafaxine XR (EFFEXOR-XR) 37.5 MG 24 hr capsule Take 1 capsule (37.5 mg total) by mouth daily with breakfast. 30 capsule 5   vitamin E 180 MG (400 UNITS) capsule Take 400 Units by mouth daily.     Cholecalciferol (VITAMIN D-3) 125 MCG (5000 UT) TABS Take 125 mcg by mouth. (Patient not taking: Reported on 07/24/2022)     famotidine (PEPCID) 10 MG tablet 10 mg daily as needed for heartburn or indigestion. (Patient not taking: Reported on 05/06/2023)     l-methylfolate-B6-B12 (METANX) 3-35-2 MG TABS tablet Take 1 tablet by mouth daily. (Patient not taking: Reported on 05/06/2023)     ondansetron (ZOFRAN) 4 MG tablet Take  1 tablet (4 mg total) by mouth every 8 (eight) hours as needed for nausea or vomiting. (Patient not taking: Reported on  05/06/2023) 30 tablet 6   Rimegepant Sulfate (NURTEC) 75 MG TBDP Use one tablet daily as needed for migraine (Patient not taking: Reported on 10/17/2021) 5 tablet 1   Turmeric 450 MG CAPS Take by mouth. (Patient not taking: Reported on 05/06/2023)     No current facility-administered medications for this visit.    Drug-Drug Interactions (DDIs) DDIs were evaluated? Yes Significant DDIs? No  The patient was instructed to speak with their health care provider and/or the oral chemotherapy pharmacist before starting any new drug, including prescription or over the counter, natural / herbal products, or vitamins.  Supportive Care Diarrhea: we reviewed that diarrhea is common with abemaciclib and confirmed that she does have loperamide (Imodium) at home.  We reviewed how to take this medication PRN. Neutropenia: we discussed the importance of having a thermometer and what the Centers for Disease Control and Prevention (CDC) considers a fever which is 100.35F (38C) or higher.  Gave patient 24/7 triage line to call if any fevers or symptoms. ILD/Pneumonitis: we reviewed potential symptoms including cough, shortness, and fatigue.  VTE: reviewed signs of DVT such as leg swelling, redness, pain, or tenderness and signs of PE such as shortness of breath, rapid or irregular heartbeat, cough, chest pain, or lightheadedness. Reviewed to take the medication every 12 hours (with food sometimes can be easier on the stomach) and to take it at the same time every day. Hepatotoxicity:WNL -- monitor Alk Phos as was elevated at last visit Drug interactions with grapefruit products  Dosing Assessment Hepatic adjustments needed? No  Renal adjustments needed? No  Toxicity adjustments needed? No  The current dosing regimen is appropriate to continue at this time.  Follow-Up Plan Continue abemaciclib  150 mg by mouth every 12 hours. She will hold for 7 days prior to surgery and resume 7 days after surgery if no complications Continue letrozole 2.5 mg by mouth daily.  Continue goserelin 3.6 mg subcutaneously every 28 days.  Dose will be given today and again on 06/02/23. Then removed if BSO done which is scheduled for 06/05/23 Start venlafaxine 37.5 mg daily for vasomotor symptoms. Prescription sent to local pharmacy of choice Will add labs, Dr. Pamelia Hoit visit, in 3 months Will add labs, pharmacy clinic visit in 6 months. Dr. Pamelia Hoit may decide for her to see clinical pharmacy PRN after December visit as she will be finishing abemaciclib tentatively on 09/03/23  Marcene Brawn participated in the discussion, expressed understanding, and voiced agreement with the above plan. All questions were answered to her satisfaction. The patient was advised to contact the clinic at [phone] with any questions or concerns prior to her return visit.   I spent 30 minutes assessing and educating the patient.  Alleya Demeter A. Odetta Pink, PharmD, BCOP, CPP  Anselm Lis, RPH-CPP, 05/06/2023  2:47 PM   **Disclaimer: This note was dictated with voice recognition software. Similar sounding words can inadvertently be transcribed and this note may contain transcription errors which may not have been corrected upon publication of note.**

## 2023-05-07 ENCOUNTER — Other Ambulatory Visit (HOSPITAL_COMMUNITY): Payer: Self-pay

## 2023-05-26 ENCOUNTER — Ambulatory Visit: Payer: Commercial Managed Care - PPO | Attending: General Surgery

## 2023-05-26 VITALS — Wt 233.5 lb

## 2023-05-26 DIAGNOSIS — Z483 Aftercare following surgery for neoplasm: Secondary | ICD-10-CM | POA: Insufficient documentation

## 2023-05-26 NOTE — Therapy (Signed)
OUTPATIENT PHYSICAL THERAPY SOZO SCREENING NOTE   Patient Name: Shannon Hale MRN: [id number] DOB:[birth year], 42 y.o., female Today's Date: 05/26/2023  PCP: Pearline Cables, MD REFERRING PROVIDER: Emelia Loron, MD   PT End of Session - 05/26/23 1650     Visit Number 4   # unchanged due to screen only   PT Start Time 1649    PT Stop Time 1653    PT Time Calculation (min) 4 min    Activity Tolerance Patient tolerated treatment well    Behavior During Therapy [facility] for tasks assessed/performed             Past Medical History:  Diagnosis Date   Abnormal Pap smear    Headache(784.0)    migraines   Hepatitis 03/2004   unknown form of hepatitis, no current liver problems   History of chicken pox    History of D&C    D&E   Migraine    Nausea    PONV (postoperative nausea and vomiting)    Vaginal Pap smear, abnormal    Past Surgical History:  Procedure Laterality Date   BREAST RECONSTRUCTION Bilateral 06/06/2021   Procedure: BILATERAL BREAST RECONSTRUCTION WITH TISSUE REARRANGEMENT;  Surgeon: Peggye Form, DO;  Location: [facility];  Service: Plastics;  Laterality: Bilateral;   CHOLECYSTECTOMY N/A 12/01/2013   Procedure: LAPAROSCOPIC CHOLECYSTECTOMY ;  Surgeon: Shelly Rubenstein, MD;  Location: [facility];  Service: General;  Laterality: N/A;   DILATION AND EVACUATION N/A 07/14/2014   Procedure: DILATATION AND EVACUATION;  Surgeon: Zelphia Cairo, MD;  Location: [facility];  Service: Gynecology;  Laterality: N/A;   MASTECTOMY W/ SENTINEL NODE BIOPSY Right 06/06/2021   Procedure: RIGHT MASTECTOMY WITH AXILLARY SENTINEL LYMPH NODE BIOPSY;  Surgeon: Emelia Loron, MD;  Location: [facility];  Service: General;  Laterality: Right;   RADIOACTIVE SEED GUIDED AXILLARY SENTINEL LYMPH NODE Right 06/06/2021   Procedure: RADIOACTIVE SEED GUIDED RIGHT AXILLARY SENTINEL LYMPH NODE EXCISION;  Surgeon: Emelia Loron, MD;  Location: [facility];  Service: General;  Laterality: Right;   SEPTOPLASTY     TONSILLECTOMY     TOTAL MASTECTOMY Left 06/06/2021   Procedure: LEFT TOTAL MASTECTOMY;  Surgeon: Emelia Loron, MD;  Location: [facility];  Service: General;  Laterality: Left;   WISDOM TOOTH EXTRACTION     Patient Active Problem List   Diagnosis Date Noted   S/P bilateral mastectomy 06/06/2021   Breast cancer (HCC) 03/27/2021   Carcinoma of overlapping sites of right breast in female, estrogen receptor positive (HCC) 03/22/2021   Headache 06/26/2020   Rash 06/26/2020   New daily persistent headache 05/21/2018   Disorder of ligament of left hand 07/20/2015   Obesity 09/25/2014   Dermatitis 12/31/2013   Cholelithiasis 02/29/2012   BACK PAIN, THORACIC REGION 11/19/2010   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 Bilateral mastectomy with no reconstruction 06/06/21. No chemotherapy. Still thinking about radiation.  4/4 nodes positive on the Rt.  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  She was told in the past that she has "scapular rotation" on the right   PRECAUTIONS: right UE Lymphedema risk, None  SUBJECTIVE: Pt returns for her last 3 month L-Dex screen.   PAIN:  Are you having  pain? No  SOZO SCREENING: Patient was assessed today using the SOZO machine to determine the lymphedema index score. This was compared to her baseline score. It was determined that she is within the recommended range when compared to her baseline and no further action is needed at this time. She will continue SOZO screenings. These are done every 3 months for 2 years post operatively followed by every 6 months for 2 years, and then annually.   L-DEX FLOWSHEETS - 05/26/23 1600        L-DEX LYMPHEDEMA SCREENING   Measurement Type Unilateral    L-DEX MEASUREMENT EXTREMITY Upper Extremity    POSITION  Standing    DOMINANT SIDE Right    At Risk Side Right    BASELINE SCORE (UNILATERAL) -3.3    L-DEX SCORE (UNILATERAL) -5.1    VALUE CHANGE (UNILAT) -1.8            P: Begin 6 month L-Dex screens.  Hermenia Bers, PTA 05/26/2023, 4:52 PM

## 2023-05-27 ENCOUNTER — Encounter (HOSPITAL_BASED_OUTPATIENT_CLINIC_OR_DEPARTMENT_OTHER): Payer: Self-pay | Admitting: Obstetrics and Gynecology

## 2023-05-27 ENCOUNTER — Other Ambulatory Visit: Payer: Self-pay

## 2023-05-27 DIAGNOSIS — C50811 Malignant neoplasm of overlapping sites of right female breast: Secondary | ICD-10-CM | POA: Diagnosis not present

## 2023-05-27 DIAGNOSIS — Z170 Estrogen receptor positive status [ER+]: Secondary | ICD-10-CM | POA: Diagnosis not present

## 2023-05-27 NOTE — Progress Notes (Signed)
Spoke w/ via phone for pre-op interview---pt Lab needs dos---- cbc, t & s, urine preg        Lab results------ COVID test -----patient states asymptomatic no test needed Arrive at -------530 06-05-2023 NPO after MN NO Solid Food.  Clear liquids from MN until---430 Med rec completed Medications to take morning of surgery -----letrozole Diabetic medication -----n/a Patient instructed no nail polish to be worn day of surgery Patient instructed to bring photo id and insurance card day of surgery Patient aware to have Driver (ride ) / caregiver    husband Shannon Hale for 24 hours after surgery -  Patient Special Instructions -----none Pre-Op special Instructions -----none Patient verbalized understanding of instructions that were given at this phone interview. Patient denies chest pain, sob, fever, cough at the interview.

## 2023-05-29 DIAGNOSIS — Z853 Personal history of malignant neoplasm of breast: Secondary | ICD-10-CM | POA: Diagnosis not present

## 2023-05-29 DIAGNOSIS — Z789 Other specified health status: Secondary | ICD-10-CM | POA: Diagnosis not present

## 2023-06-02 ENCOUNTER — Inpatient Hospital Stay: Payer: Commercial Managed Care - PPO

## 2023-06-03 ENCOUNTER — Other Ambulatory Visit (HOSPITAL_COMMUNITY): Payer: Self-pay

## 2023-06-04 LAB — SIGNATERA
SIGNATERA MTM READOUT: 0 MTM/ml
SIGNATERA TEST RESULT: NEGATIVE

## 2023-06-04 NOTE — H&P (Signed)
Preoperative History and Physical  Shannon Hale is an 42 y.o. G4P2 who presents for scheduled laparoscopic bilateral salpingo-oophorectomy. Has history of estrogen receptor positive breast cancer diagnosed in 2022 s/p bilateral mastectomy. Currently being maintained on letrozole. Her oncologist recommended BSO.  Pertinent Gynecological History: Menses:  none Bleeding: none Contraception: none DES exposure: unknown Blood transfusions: none Sexually transmitted diseases: no past history Previous GYN Procedures: D&C Last mammogram: N/A, s/p bilateral mastectomy Last pap: abnormal: ASCUS  Date: 2023 OB History: G4, P2     Past Medical History:  Diagnosis Date   Abnormal Pap smear    x 1   GERD (gastroesophageal reflux disease)    Headache(784.0)    migraines   Hepatitis 03/2004   unknown form of hepatitis, no current liver problems   History of breast cancer    right and left   History of chicken pox    as child   History of D&C    D&E   Migraine    Nausea    PONV (postoperative nausea and vomiting)    wants scopolamine patch   Vaginal Pap smear, abnormal    Wears contact lenses     Past Surgical History:  Procedure Laterality Date   BREAST RECONSTRUCTION Bilateral 06/06/2021   Procedure: BILATERAL BREAST RECONSTRUCTION WITH TISSUE REARRANGEMENT;  Surgeon: Peggye Form, DO;  Location: [facility];  Service: Plastics;  Laterality: Bilateral;   CHOLECYSTECTOMY N/A 12/01/2013   Procedure: LAPAROSCOPIC CHOLECYSTECTOMY ;  Surgeon: Shelly Rubenstein, MD;  Location: [facility];  Service: General;  Laterality: N/A;   DILATION AND EVACUATION N/A 07/14/2014   Procedure: DILATATION AND EVACUATION;  Surgeon: Zelphia Cairo, MD;  Location: [facility];  Service: Gynecology;  Laterality: N/A;   MASTECTOMY W/ SENTINEL NODE BIOPSY Right 06/06/2021   Procedure: RIGHT MASTECTOMY WITH AXILLARY SENTINEL LYMPH NODE BIOPSY;  Surgeon: Emelia Loron, MD;  Location: [facility];  Service: General;  Laterality: Right;   RADIOACTIVE SEED GUIDED AXILLARY SENTINEL LYMPH NODE Right 06/06/2021   Procedure: RADIOACTIVE SEED GUIDED RIGHT AXILLARY SENTINEL LYMPH NODE EXCISION;  Surgeon: Emelia Loron, MD;  Location: [facility];  Service: General;  Laterality: Right;   SEPTOPLASTY  2012   TONSILLECTOMY  2012   TOTAL MASTECTOMY Left 06/06/2021   Procedure: LEFT TOTAL MASTECTOMY;  Surgeon: Emelia Loron, MD;  Location: [facility];  Service: General;  Laterality: Left;   WISDOM TOOTH EXTRACTION     age 16    Family History  Problem Relation Age of Onset   Cancer Mother        breast, leaukemia-Living   Angina Father    Hypertension Maternal Grandmother    Diabetes Maternal Grandmother    Depression Maternal Grandmother        anxiety also   Cancer Maternal Grandmother        breast   Diabetes Paternal Grandmother    Heart disease Paternal Grandfather    Cancer Paternal Grandfather        colon   Cancer Maternal Aunt        cervical   Seizures Brother        Resolved   Diabetes Paternal Uncle    Healthy Brother        x2   Healthy Son        x1    Social History:  reports that she quit smoking about 20 years ago. Her smoking use included cigarettes. She started smoking about  21 years ago. She has a 0.1 pack-year smoking history. She has never used smokeless tobacco. She reports current alcohol use. She reports that she does not use drugs.  Allergies: No Known Allergies  ROS neg  Vitals to be reviewed on admission, office vitals wnl Height 5\' 9"  (1.753 m), weight 104.3 kg.  Constitutional *General Appearance: healthy-appearing, well-nourished, well-developed  Head Head: normocephalic  Neck *Thyroid: no enlargement, no nodules, non-tender  Cardiovascular *Auscultation: RRR, no murmur  Lungs *Respiratory Effort: no accessory muscle usage, no intercostal retractions *Auscultation: clear to  auscultation, no wheezing, no rales/crackles, no rhonchi Inspection: normal, normal respiratory rate  *Breast Bilateral: no skin changes, nipple appearance: normal, no abnormal nipple secretions, no tenderness, no masses palpable Right Breast: normal Left Breast: normal  Abdomen *Inspection/Palpation/Auscultation: non-distended, no tenderness, no rebound, no guarding, soft, no hepatomegaly, no splenomegaly *Hernia: none palpated  Female Genitalia Vulva: no masses, no atrophy, no lesions Labia Majora: normal, no erythema, no excoriation, no atrophy, no discoloration, no lesions, no vesicles/ ulcers, no masses, no swelling, no tenderness Introitus: normal Bartholin's Gland: normal *Vagina: normal, no discharge, no blood present, no erythema, no atrophy, no lesions, no ulcers, no swelling, no masses, no tenderness, no prolapse *Cervix: grossly normal, no lesions, no discharge, no bleeding, no cervical motion tenderness *Uterus: normal size, normal contour, midline, no uterine prolapse, mobile, non-tender *Urethral Meatus/ Urethra: normal meatus, no discharge, well supported urethra, no masses, no tenderness *Bladder: non-distended, no palpable mass, non-tender *Adnexa/Parametria: no mass palpable, no tenderness  Psychiatric *Orientation: to person, to place, to time *Mood and Affect: active and alert, normal mood, normal affect  Assessment/Plan: Shannon Hale is an 42 y.o. G4P2 who presents for scheduled laparoscopic bilateral salpingo-oophorectomy.  We discussed laparoscopic BSO. She understands that any laparoscopic procedure has risk of needing to be performed open if difficulty controlling bleeding or visualizing structures. R/b/a discussed with patient. She understands risks of bleeding, infection, and injury to surrounding structures and need for possible reoperation if complications. Reviewed postoperative instructions. Plan for discharge from PACU and postoperative follow up  visit in 2 weeks.  Tawni Levy 06/04/2023, 8:43 AM

## 2023-06-05 ENCOUNTER — Ambulatory Visit (HOSPITAL_BASED_OUTPATIENT_CLINIC_OR_DEPARTMENT_OTHER): Payer: Commercial Managed Care - PPO | Admitting: Anesthesiology

## 2023-06-05 ENCOUNTER — Encounter (HOSPITAL_BASED_OUTPATIENT_CLINIC_OR_DEPARTMENT_OTHER)
Admission: RE | Disposition: A | Discharge status: Home / Self Care | Payer: Self-pay | Source: Home / Self Care | Attending: Obstetrics and Gynecology

## 2023-06-05 ENCOUNTER — Ambulatory Visit (HOSPITAL_BASED_OUTPATIENT_CLINIC_OR_DEPARTMENT_OTHER)
Admission: RE | Admit: 2023-06-05 | Discharge: 2023-06-05 | Disposition: A | Discharge status: Home / Self Care | Payer: Commercial Managed Care - PPO | Attending: Obstetrics and Gynecology | Admitting: Obstetrics and Gynecology

## 2023-06-05 ENCOUNTER — Other Ambulatory Visit: Payer: Self-pay

## 2023-06-05 ENCOUNTER — Encounter (HOSPITAL_BASED_OUTPATIENT_CLINIC_OR_DEPARTMENT_OTHER): Payer: Self-pay | Admitting: Obstetrics and Gynecology

## 2023-06-05 ENCOUNTER — Encounter: Payer: Self-pay | Admitting: Hematology and Oncology

## 2023-06-05 ENCOUNTER — Other Ambulatory Visit (HOSPITAL_COMMUNITY): Payer: Self-pay

## 2023-06-05 DIAGNOSIS — Z87891 Personal history of nicotine dependence: Secondary | ICD-10-CM | POA: Insufficient documentation

## 2023-06-05 DIAGNOSIS — Z9013 Acquired absence of bilateral breasts and nipples: Secondary | ICD-10-CM | POA: Insufficient documentation

## 2023-06-05 DIAGNOSIS — Z4002 Encounter for prophylactic removal of ovary: Secondary | ICD-10-CM

## 2023-06-05 DIAGNOSIS — K219 Gastro-esophageal reflux disease without esophagitis: Secondary | ICD-10-CM | POA: Insufficient documentation

## 2023-06-05 DIAGNOSIS — Z170 Estrogen receptor positive status [ER+]: Secondary | ICD-10-CM | POA: Insufficient documentation

## 2023-06-05 DIAGNOSIS — C50811 Malignant neoplasm of overlapping sites of right female breast: Secondary | ICD-10-CM

## 2023-06-05 DIAGNOSIS — Z01818 Encounter for other preprocedural examination: Secondary | ICD-10-CM

## 2023-06-05 DIAGNOSIS — Z853 Personal history of malignant neoplasm of breast: Secondary | ICD-10-CM | POA: Insufficient documentation

## 2023-06-05 DIAGNOSIS — Z803 Family history of malignant neoplasm of breast: Secondary | ICD-10-CM | POA: Diagnosis not present

## 2023-06-05 HISTORY — DX: Personal history of malignant neoplasm of breast: Z85.3

## 2023-06-05 HISTORY — DX: Presence of spectacles and contact lenses: Z97.3

## 2023-06-05 HISTORY — PX: LAPAROSCOPIC SALPINGO OOPHERECTOMY: SHX5927

## 2023-06-05 HISTORY — DX: Gastro-esophageal reflux disease without esophagitis: K21.9

## 2023-06-05 LAB — CBC
HCT: 36.8 % (ref 36.0–46.0)
Hemoglobin: 12.6 g/dL (ref 12.0–15.0)
MCH: 29.5 pg (ref 26.0–34.0)
MCHC: 34.2 g/dL (ref 30.0–36.0)
MCV: 86.2 fL (ref 80.0–100.0)
Platelets: 307 10*3/uL (ref 150–400)
RBC: 4.27 MIL/uL (ref 3.87–5.11)
RDW: 12.9 % (ref 11.5–15.5)
WBC: 5.1 10*3/uL (ref 4.0–10.5)
nRBC: 0 % (ref 0.0–0.2)

## 2023-06-05 LAB — TYPE AND SCREEN
ABO/RH(D): A POS
Antibody Screen: NEGATIVE

## 2023-06-05 LAB — POCT PREGNANCY, URINE: Preg Test, Ur: NEGATIVE

## 2023-06-05 SURGERY — SALPINGO-OOPHORECTOMY, LAPAROSCOPIC
Anesthesia: General | Site: Abdomen | Laterality: Bilateral

## 2023-06-05 MED ORDER — BUPIVACAINE HCL (PF) 0.5 % IJ SOLN
INTRAMUSCULAR | Status: DC | PRN
  Administered 2023-06-05: 14 mL

## 2023-06-05 MED ORDER — ROCURONIUM BROMIDE 10 MG/ML (PF) SYRINGE
PREFILLED_SYRINGE | INTRAVENOUS | Status: AC
  Filled 2023-06-05: qty 10

## 2023-06-05 MED ORDER — FENTANYL CITRATE (PF) 100 MCG/2ML IJ SOLN: 25.0000 ug | INTRAMUSCULAR | Status: DC | PRN

## 2023-06-05 MED ORDER — FENTANYL CITRATE (PF) 100 MCG/2ML IJ SOLN
INTRAMUSCULAR | Status: AC
  Filled 2023-06-05: qty 2

## 2023-06-05 MED ORDER — 0.9 % SODIUM CHLORIDE (POUR BTL) OPTIME
TOPICAL | Status: DC | PRN
Start: 2023-06-05 — End: 2023-06-05
  Administered 2023-06-05: 500 mL

## 2023-06-05 MED ORDER — SCOPOLAMINE 1 MG/3DAYS TD PT72
MEDICATED_PATCH | TRANSDERMAL | Status: DC | PRN
  Administered 2023-06-05: 1 via TRANSDERMAL

## 2023-06-05 MED ORDER — ARTIFICIAL TEARS OPHTHALMIC OINT
TOPICAL_OINTMENT | OPHTHALMIC | Status: AC
  Filled 2023-06-05: qty 3.5

## 2023-06-05 MED ORDER — ONDANSETRON HCL 4 MG/2ML IJ SOLN
INTRAMUSCULAR | Status: AC
  Filled 2023-06-05: qty 2

## 2023-06-05 MED ORDER — LIDOCAINE HCL (PF) 2 % IJ SOLN
INTRAMUSCULAR | Status: AC
  Filled 2023-06-05: qty 5

## 2023-06-05 MED ORDER — ONDANSETRON HCL 4 MG/2ML IJ SOLN
4.0000 mg | Freq: Once | INTRAMUSCULAR | Status: AC | PRN
  Administered 2023-06-05: 4 mg via INTRAVENOUS

## 2023-06-05 MED ORDER — ACETAMINOPHEN 10 MG/ML IV SOLN
INTRAVENOUS | Status: DC | PRN
Start: 2023-06-05 — End: 2023-06-05
  Administered 2023-06-05: 1000 mg via INTRAVENOUS

## 2023-06-05 MED ORDER — OXYCODONE HCL 5 MG PO TABS
5.0000 mg | ORAL_TABLET | ORAL | 0 refills | Status: AC | PRN
Start: 2023-06-05 — End: ?
  Filled 2023-06-05: qty 10, 2d supply, fill #0

## 2023-06-05 MED ORDER — FENTANYL CITRATE (PF) 100 MCG/2ML IJ SOLN
INTRAMUSCULAR | Status: DC | PRN
  Administered 2023-06-05: 100 ug via INTRAVENOUS

## 2023-06-05 MED ORDER — MIDAZOLAM HCL 5 MG/5ML IJ SOLN
INTRAMUSCULAR | Status: DC | PRN
  Administered 2023-06-05: 2 mg via INTRAVENOUS

## 2023-06-05 MED ORDER — OXYCODONE HCL 5 MG PO TABS: 5.0000 mg | ORAL_TABLET | Freq: Once | ORAL | Status: DC | PRN

## 2023-06-05 MED ORDER — LIDOCAINE 2% (20 MG/ML) 5 ML SYRINGE
INTRAMUSCULAR | Status: DC | PRN
  Administered 2023-06-05: 100 mg via INTRAVENOUS

## 2023-06-05 MED ORDER — ACETAMINOPHEN 325 MG PO TABS
650.0000 mg | ORAL_TABLET | Freq: Four times a day (QID) | ORAL | 1 refills | Status: AC | PRN
  Filled 2023-06-05: qty 60, 8d supply, fill #0

## 2023-06-05 MED ORDER — DEXMEDETOMIDINE HCL IN NACL 80 MCG/20ML IV SOLN
INTRAVENOUS | Status: AC
  Filled 2023-06-05: qty 20

## 2023-06-05 MED ORDER — ACETAMINOPHEN 10 MG/ML IV SOLN
INTRAVENOUS | Status: AC
  Filled 2023-06-05: qty 100

## 2023-06-05 MED ORDER — PROPOFOL 500 MG/50ML IV EMUL
INTRAVENOUS | Status: DC | PRN
  Administered 2023-06-05: 150 ug/kg/min via INTRAVENOUS

## 2023-06-05 MED ORDER — POVIDONE-IODINE 10 % EX SWAB: 2.0000 | Freq: Once | CUTANEOUS | Status: DC

## 2023-06-05 MED ORDER — IBUPROFEN 600 MG PO TABS
600.0000 mg | ORAL_TABLET | Freq: Four times a day (QID) | ORAL | 0 refills | Status: AC | PRN
  Filled 2023-06-05: qty 30, 8d supply, fill #0

## 2023-06-05 MED ORDER — ACETAMINOPHEN 10 MG/ML IV SOLN: 1000.0000 mg | Freq: Once | INTRAVENOUS | Status: DC | PRN

## 2023-06-05 MED ORDER — PROPOFOL 10 MG/ML IV BOLUS
INTRAVENOUS | Status: DC | PRN
  Administered 2023-06-05: 150 mg via INTRAVENOUS

## 2023-06-05 MED ORDER — DEXMEDETOMIDINE HCL IN NACL 80 MCG/20ML IV SOLN
INTRAVENOUS | Status: DC | PRN
  Administered 2023-06-05: 8 ug via INTRAVENOUS
  Administered 2023-06-05: 4 ug via INTRAVENOUS
  Administered 2023-06-05: 8 ug via INTRAVENOUS

## 2023-06-05 MED ORDER — ONDANSETRON HCL 4 MG/2ML IJ SOLN
INTRAMUSCULAR | Status: DC | PRN
  Administered 2023-06-05: 4 mg via INTRAVENOUS

## 2023-06-05 MED ORDER — OXYCODONE HCL 5 MG/5ML PO SOLN: 5.0000 mg | Freq: Once | ORAL | Status: DC | PRN

## 2023-06-05 MED ORDER — SCOPOLAMINE 1 MG/3DAYS TD PT72
MEDICATED_PATCH | TRANSDERMAL | Status: AC
  Filled 2023-06-05: qty 1

## 2023-06-05 MED ORDER — DEXAMETHASONE SODIUM PHOSPHATE 10 MG/ML IJ SOLN
INTRAMUSCULAR | Status: AC
  Filled 2023-06-05: qty 1

## 2023-06-05 MED ORDER — PROPOFOL 1000 MG/100ML IV EMUL
INTRAVENOUS | Status: AC
  Filled 2023-06-05: qty 100

## 2023-06-05 MED ORDER — ROCURONIUM BROMIDE 10 MG/ML (PF) SYRINGE
PREFILLED_SYRINGE | INTRAVENOUS | Status: DC | PRN
  Administered 2023-06-05: 60 mg via INTRAVENOUS
  Administered 2023-06-05: 10 mg via INTRAVENOUS

## 2023-06-05 MED ORDER — MIDAZOLAM HCL 2 MG/2ML IJ SOLN
INTRAMUSCULAR | Status: AC
  Filled 2023-06-05: qty 2

## 2023-06-05 MED ORDER — SENNOSIDES-DOCUSATE SODIUM 8.6-50 MG PO TABS
1.0000 | ORAL_TABLET | Freq: Every day | ORAL | 1 refills | Status: AC
  Filled 2023-06-05: qty 30, 30d supply, fill #0

## 2023-06-05 MED ORDER — DEXAMETHASONE SODIUM PHOSPHATE 10 MG/ML IJ SOLN
INTRAMUSCULAR | Status: DC | PRN
  Administered 2023-06-05: 10 mg via INTRAVENOUS

## 2023-06-05 MED ORDER — SUGAMMADEX SODIUM 200 MG/2ML IV SOLN
INTRAVENOUS | Status: DC | PRN
  Administered 2023-06-05: 200 mg via INTRAVENOUS

## 2023-06-05 MED ORDER — LACTATED RINGERS IV SOLN
INTRAVENOUS | Status: DC
  Administered 2023-06-05: 1000 mL via INTRAVENOUS

## 2023-06-05 SURGICAL SUPPLY — 39 items
APL PRP STRL LF DISP 70% ISPRP (MISCELLANEOUS) ×1
APL SKNCLS STERI-STRIP NONHPOA (GAUZE/BANDAGES/DRESSINGS) ×2
BENZOIN TINCTURE PRP APPL 2/3 (GAUZE/BANDAGES/DRESSINGS) IMPLANT
CHLORAPREP W/TINT 26 (MISCELLANEOUS) ×1 IMPLANT
COVER MAYO STAND STRL (DRAPES) ×1 IMPLANT
DRAPE SURG IRRIG POUCH 19X23 (DRAPES) ×1 IMPLANT
DRSG TEGADERM 2-3/8X2-3/4 SM (GAUZE/BANDAGES/DRESSINGS) IMPLANT
GAUZE SPONGE 2X2 STRL 8-PLY (GAUZE/BANDAGES/DRESSINGS) IMPLANT
GLOVE BIOGEL PI IND STRL 6 (GLOVE) ×1 IMPLANT
GLOVE BIOGEL PI IND STRL 7.0 (GLOVE) IMPLANT
GLOVE SS PI 5.5 STRL (GLOVE) ×1 IMPLANT
GLOVE SURG SYN 6.5 ES PF (GLOVE) ×2 IMPLANT
GLOVE SURG SYN 6.5 PF PI (GLOVE) IMPLANT
GOWN STRL REUS W/ TWL LRG LVL3 (GOWN DISPOSABLE) IMPLANT
GOWN STRL REUS W/TWL LRG LVL3 (GOWN DISPOSABLE) ×3 IMPLANT
IRRIG SUCT STRYKERFLOW 2 WTIP (MISCELLANEOUS) ×1
IRRIGATION SUCT STRKRFLW 2 WTP (MISCELLANEOUS) ×1 IMPLANT
KIT PINK PAD W/HEAD ARE REST (MISCELLANEOUS) ×1
KIT PINK PAD W/HEAD ARM REST (MISCELLANEOUS) ×1 IMPLANT
KIT TURNOVER CYSTO (KITS) ×1 IMPLANT
LIGASURE VESSEL 5MM BLUNT TIP (ELECTROSURGICAL) IMPLANT
NS IRRIG 500ML POUR BTL (IV SOLUTION) ×1 IMPLANT
PACK LAPAROSCOPY BASIN (CUSTOM PROCEDURE TRAY) ×1 IMPLANT
PAD OB MATERNITY 4.3X12.25 (PERSONAL CARE ITEMS) ×1 IMPLANT
SCISSORS LAP 5X35 DISP (ENDOMECHANICALS) ×1 IMPLANT
SET TUBE SMOKE EVAC HIGH FLOW (TUBING) ×1 IMPLANT
SLEEVE ADV FIXATION 5X100MM (TROCAR) ×1 IMPLANT
SLEEVE SCD COMPRESS KNEE MED (STOCKING) ×1 IMPLANT
SLEEVE Z-THREAD 5X100MM (TROCAR) ×2 IMPLANT
SOL PREP POV-IOD 4OZ 10% (MISCELLANEOUS) IMPLANT
STRIP CLOSURE SKIN 1/4X4 (GAUZE/BANDAGES/DRESSINGS) ×1 IMPLANT
SUT VIC AB 4-0 PS2 27 (SUTURE) ×1 IMPLANT
SYS RETRIEVAL 5MM INZII UNIV (BASKET) ×1
SYSTEM RETRIEVL 5MM INZII UNIV (BASKET) IMPLANT
TOWEL OR 17X24 6PK STRL BLUE (TOWEL DISPOSABLE) ×1 IMPLANT
TRAY FOLEY W/BAG SLVR 14FR LF (SET/KITS/TRAYS/PACK) ×1 IMPLANT
TROCAR ADV FIXATION 5X100MM (TROCAR) ×1 IMPLANT
TROCAR KII 8X100ML NONTHREADED (TROCAR) IMPLANT
WARMER LAPAROSCOPE (MISCELLANEOUS) ×1 IMPLANT

## 2023-06-05 NOTE — Discharge Instructions (Signed)
   No acetaminophen/Tylenol until after 2 pm today if needed.   Post Anesthesia Home Care Instructions  Activity: Get plenty of rest for the remainder of the day. A responsible individual must stay with you for 24 hours following the procedure.  For the next 24 hours, DO NOT: -Drive a car -Advertising copywriter -Drink alcoholic beverages -Take any medication unless instructed by your physician -Make any legal decisions or sign important papers.  Meals: Start with liquid foods such as gelatin or soup. Progress to regular foods as tolerated. Avoid greasy, spicy, heavy foods. If nausea and/or vomiting occur, drink only clear liquids until the nausea and/or vomiting subsides. Call your physician if vomiting continues.  Special Instructions/Symptoms: Your throat may feel dry or sore from the anesthesia or the breathing tube placed in your throat during surgery. If this causes discomfort, gargle with warm salt water. The discomfort should disappear within 24 hours.  If you had a scopolamine patch placed behind your ear for the management of post- operative nausea and/or vomiting:  1. The medication in the patch is effective for 72 hours, after which it should be removed.  Wrap patch in a tissue and discard in the trash. Wash hands thoroughly with soap and water. 2. You may remove the patch earlier than 72 hours if you experience unpleasant side effects which may include dry mouth, dizziness or visual disturbances. 3. Avoid touching the patch. Wash your hands with soap and water after contact with the patch.

## 2023-06-05 NOTE — Anesthesia Postprocedure Evaluation (Signed)
Anesthesia Post Note  Patient: Shannon Hale  Procedure(s) Performed: LAPAROSCOPIC SALPINGO OOPHORECTOMY (Bilateral: Abdomen)     Patient location during evaluation: PACU Anesthesia Type: General Level of consciousness: awake and alert Pain management: pain level controlled Vital Signs Assessment: post-procedure vital signs reviewed and stable Respiratory status: spontaneous breathing, nonlabored ventilation, respiratory function stable and patient connected to nasal cannula oxygen Cardiovascular status: blood pressure returned to baseline and stable Postop Assessment: no apparent nausea or vomiting Anesthetic complications: no   No notable events documented.  Last Vitals:  Vitals:   06/05/23 0945 06/05/23 1018  BP: 120/76 119/70  Pulse: 65 (!) 59  Resp: 12 14  Temp:  36.6 C  SpO2: 98% 96%    Last Pain:  Vitals:   06/05/23 1018  TempSrc:   PainSc: 4                  Mariann Barter

## 2023-06-05 NOTE — Op Note (Signed)
PREOPERATIVE DIAGNOSES: 1. History of ER positive breast cancer  POSTOPERATIVE DIAGNOSES: Same  PROCEDURE PERFORMED: Laparoscopic bilateral salpingo-oophorectomy  SURGEON: Dr. Jule Economy  ANESTHESIA: GET  ESTIMATED BLOOD LOSS: 10ccs  COMPLICATIONS: None  TUBES: None.  DRAINS: None  PATHOLOGY: Bilateral fallopian tubes and ovaries  FINDINGS: On exam, under anesthesia, normal appearing vagina and cervix. Normal sized uterus. Upper abdomen survey normal. Normal appearing bilateral fallopian tubes and ovaries. Normal-appearing uterus, posterior cul de sac.  PROCEDURE: The patient was consented and seen preoperatively. Consent was signed and witnessed prior to procedure start with all questions answered. Antibiotics were not indicated. She was taken to the operating room, and placed under general anesthesia with pneumatic compression stockings in place on her lower extremities. The patient was then placed in a dorsal lithotomy position with Allen stirrups. The patient was prepped and draped in the normal sterile fashion. A time-out was performed with everyone in agreement.   A Foley catheter was inserted with yellow urine found to be draining appropriate. A sterile speculum was placed and a Hulka manipulator was placed. The speculum was removed.  The skin in and around the umbilicus was injected with plain Bupivicaine. A 5mm skin incision was made at the base of the umbilicus. The 5mm Optiview trocar with rounded tip obturator was introduced to the abdomen without difficulty with careful elevation of the anterior abdominal wall. The obturator was removed. Pneumoperitoneum was initiated at high flow. The 5mm scope was then inserted to the port for inspection of the abdomen and pelvis with findings noted above.  Next, two additional ports were placed, one lateral and one equidistant from the umbilical port and the lateral port. The skin was anesthetized in a similar fashion as above. A small  skin incision was made with the scalpel bilaterally. Two ports (1 5mm and 1 8mm port) were then placed under direct visualization without difficulty.  The patient was placed in Trendelenburg. The ureter was identified at a safe distance from the operative field. The peritoneum was opened inferior to the round ligament and the ureter was again identified and noted to be far from the operative area. The IP ligament was sealed and ligated with the Ligasure. The uteroovarian ligament was also sealed and ligated and the fallopian tube was amputated close to the cornua. The same procedure was carried out on the contralateral side. The operative sites were examined and found to be hemostatic.   The specimens were placed in an endocatch bag and removed from the 8mm incision. The skin was extended 1mm to allow for removal of specimens and bag in an intact fashion.  The abdomen and pelvis were thoroughly inspected under low pressure. Good hemostasis was appreciated throughout. All instruments were removed from the abdomen. The pneumoperitoneum was released and correct instrument counts were confirmed. Skin incisions were closed with 4-0 Vicryl. The Hulka manipulator and foley catheter were removed. The patient tolerated the procedure well. She was taken to the recovery room in stable condition. The patient will be discharged from the PACU after all criteria are met.    Jule Economy, MD

## 2023-06-05 NOTE — Anesthesia Preprocedure Evaluation (Signed)
Anesthesia Evaluation  Patient identified by MRN, date of birth, ID band Patient awake    Reviewed: Allergy & Precautions, NPO status , Patient's Chart, lab work & pertinent test results, reviewed documented beta blocker date and time   History of Anesthesia Complications (+) PONV and history of anesthetic complications  Airway Mallampati: II  TM Distance: >3 FB Neck ROM: Full    Dental no notable dental hx.    Pulmonary neg shortness of breath, neg COPD, former smoker, neg PE   breath sounds clear to auscultation       Cardiovascular Exercise Tolerance: Good (-) hypertension(-) angina (-) CAD, (-) Past MI, (-) Cardiac Stents and (-) CABG  Rhythm:Regular Rate:Normal     Neuro/Psych  Headaches, neg Seizures    GI/Hepatic ,GERD  ,,(+) Hepatitis -, A  Endo/Other    Renal/GU Renal disease     Musculoskeletal   Abdominal   Peds  Hematology   Anesthesia Other Findings   Reproductive/Obstetrics                              Anesthesia Physical Anesthesia Plan  ASA: 2  Anesthesia Plan: General   Post-op Pain Management:    Induction:   PONV Risk Score and Plan: 4 or greater and TIVA  Airway Management Planned: Oral ETT  Additional Equipment:   Intra-op Plan:   Post-operative Plan: Extubation in OR  Informed Consent: I have reviewed the patients History and Physical, chart, labs and discussed the procedure including the risks, benefits and alternatives for the proposed anesthesia with the patient or authorized representative who has indicated his/her understanding and acceptance.     Dental advisory given  Plan Discussed with: CRNA  Anesthesia Plan Comments:          Anesthesia Quick Evaluation

## 2023-06-05 NOTE — Transfer of Care (Signed)
Immediate Anesthesia Transfer of Care Note  Patient: Shannon Hale  Procedure(s) Performed: LAPAROSCOPIC SALPINGO OOPHORECTOMY (Bilateral)  Patient Location: PACU  Anesthesia Type:General  Level of Consciousness: drowsy  Airway & Oxygen Therapy: Patient Spontanous Breathing and Patient connected to nasal cannula oxygen  Post-op Assessment: Report given to RN  Post vital signs: Reviewed and stable  Last Vitals:  Vitals Value Taken Time  BP 103/70 06/05/23 0853  Temp    Pulse 71   Resp 13 06/05/23 0854  SpO2 100   Vitals shown include unfiled device data.  Last Pain:  Vitals:   06/05/23 0600  TempSrc: Oral  PainSc: 0-No pain      Patients Stated Pain Goal: 7 (06/05/23 0600)  Complications: No notable events documented.

## 2023-06-05 NOTE — Interval H&P Note (Signed)
History and Physical Interval Note:  06/05/2023 7:18 AM  Marcene Brawn  has presented today for surgery, with the diagnosis of HISTORY OF BREAST CANCER.  The various methods of treatment have been discussed with the patient and family. After consideration of risks, benefits and other options for treatment, the patient has consented to  Procedure(s): LAPAROSCOPIC SALPINGO OOPHORECTOMY (Bilateral) OPEN SALPINGO OOPHORECTOMY- possible (Bilateral) as a surgical intervention.  The patient's history has been reviewed, patient examined, no change in status, stable for surgery.  I have reviewed the patient's chart and labs.  Questions were answered to the patient's satisfaction.    Tawni Levy

## 2023-06-05 NOTE — Anesthesia Procedure Notes (Addendum)
Procedure Name: Intubation Date/Time: 06/05/2023 7:35 AM  Performed by: Briant Sites, CRNAPre-anesthesia Checklist: Patient identified, Emergency Drugs available, Suction available and Patient being monitored Patient Re-evaluated:Patient Re-evaluated prior to induction Oxygen Delivery Method: Circle system utilized Preoxygenation: Pre-oxygenation with 100% oxygen Induction Type: IV induction Ventilation: Mask ventilation without difficulty Laryngoscope Size: Mac and 3 Grade View: Grade I Tube type: Oral Number of attempts: 1 Airway Equipment and Method: Stylet Placement Confirmation: ETT inserted through vocal cords under direct vision, positive ETCO2 and breath sounds checked- equal and bilateral Secured at: 20 cm Tube secured with: Tape Dental Injury: Teeth and Oropharynx as per pre-operative assessment

## 2023-06-06 ENCOUNTER — Telehealth: Payer: Self-pay

## 2023-06-06 ENCOUNTER — Encounter (HOSPITAL_BASED_OUTPATIENT_CLINIC_OR_DEPARTMENT_OTHER): Payer: Self-pay | Admitting: Obstetrics and Gynecology

## 2023-06-06 NOTE — Telephone Encounter (Signed)
Called pt per MD to advise [employer] testing was negative/not detected. Pt verbalized understanding of results and knows [facility] will be in touch to schedule 3 mo repeat lab.   

## 2023-06-09 ENCOUNTER — Telehealth: Payer: Self-pay | Admitting: Hematology and Oncology

## 2023-06-09 NOTE — Telephone Encounter (Signed)
Patient has stated they did not want to rescheduled missed injection appointment due to not needing that supportive care anymore, patient is aware of next scheduled visit and has confirmed those appointments

## 2023-06-11 LAB — SURGICAL PATHOLOGY

## 2023-06-19 ENCOUNTER — Other Ambulatory Visit: Payer: Self-pay

## 2023-06-24 ENCOUNTER — Other Ambulatory Visit: Payer: Self-pay

## 2023-06-24 ENCOUNTER — Other Ambulatory Visit (HOSPITAL_COMMUNITY): Payer: Self-pay

## 2023-06-24 ENCOUNTER — Other Ambulatory Visit: Payer: Self-pay | Admitting: Hematology and Oncology

## 2023-06-24 MED ORDER — ABEMACICLIB 150 MG PO TABS
150.0000 mg | ORAL_TABLET | Freq: Two times a day (BID) | ORAL | 3 refills | Status: DC
  Filled 2023-06-24: qty 56, 28d supply, fill #0

## 2023-06-24 NOTE — Telephone Encounter (Signed)
John can you please help fill. Thanks

## 2023-06-24 NOTE — Progress Notes (Addendum)
Specialty Pharmacy Refill Coordination Note  Shannon Hale is a 42 y.o. female contacted today regarding refills of specialty medication(s) Abemaciclib   Patient requested Delivery   Delivery date: 06/27/23   Verified address: [address] [name], [place] [address]   Medication will be filled on 06/26/23.  Refill request pending. Call if delayed.

## 2023-07-07 ENCOUNTER — Inpatient Hospital Stay: Payer: Commercial Managed Care - PPO | Attending: Hematology and Oncology

## 2023-07-07 ENCOUNTER — Inpatient Hospital Stay (HOSPITAL_BASED_OUTPATIENT_CLINIC_OR_DEPARTMENT_OTHER): Payer: Commercial Managed Care - PPO | Admitting: Hematology and Oncology

## 2023-07-07 ENCOUNTER — Other Ambulatory Visit: Payer: Self-pay

## 2023-07-07 VITALS — BP 114/66 | HR 72 | Temp 97.3°F | Resp 18 | Ht 69.0 in | Wt 229.6 lb

## 2023-07-07 DIAGNOSIS — C50811 Malignant neoplasm of overlapping sites of right female breast: Secondary | ICD-10-CM

## 2023-07-07 DIAGNOSIS — Z79811 Long term (current) use of aromatase inhibitors: Secondary | ICD-10-CM | POA: Insufficient documentation

## 2023-07-07 DIAGNOSIS — Z170 Estrogen receptor positive status [ER+]: Secondary | ICD-10-CM

## 2023-07-07 DIAGNOSIS — Z9013 Acquired absence of bilateral breasts and nipples: Secondary | ICD-10-CM | POA: Diagnosis not present

## 2023-07-07 LAB — CBC WITH DIFFERENTIAL (CANCER CENTER ONLY)
Abs Immature Granulocytes: 0 10*3/uL (ref 0.00–0.07)
Basophils Absolute: 0 10*3/uL (ref 0.0–0.1)
Basophils Relative: 1 %
Eosinophils Absolute: 0.2 10*3/uL (ref 0.0–0.5)
Eosinophils Relative: 4 %
HCT: 37.5 % (ref 36.0–46.0)
Hemoglobin: 13.1 g/dL (ref 12.0–15.0)
Immature Granulocytes: 0 %
Lymphocytes Relative: 38 %
Lymphs Abs: 1.7 10*3/uL (ref 0.7–4.0)
MCH: 29.5 pg (ref 26.0–34.0)
MCHC: 34.9 g/dL (ref 30.0–36.0)
MCV: 84.5 fL (ref 80.0–100.0)
Monocytes Absolute: 0.3 10*3/uL (ref 0.1–1.0)
Monocytes Relative: 6 %
Neutro Abs: 2.2 10*3/uL (ref 1.7–7.7)
Neutrophils Relative %: 51 %
Platelet Count: 269 10*3/uL (ref 150–400)
RBC: 4.44 MIL/uL (ref 3.87–5.11)
RDW: 12.1 % (ref 11.5–15.5)
WBC Count: 4.4 10*3/uL (ref 4.0–10.5)
nRBC: 0 % (ref 0.0–0.2)

## 2023-07-07 LAB — CMP (CANCER CENTER ONLY)
ALT: 16 U/L (ref 0–44)
AST: 21 U/L (ref 15–41)
Albumin: 4.7 g/dL (ref 3.5–5.0)
Alkaline Phosphatase: 117 U/L (ref 38–126)
Anion gap: 6 (ref 5–15)
BUN: 13 mg/dL (ref 6–20)
CO2: 27 mmol/L (ref 22–32)
Calcium: 10.2 mg/dL (ref 8.9–10.3)
Chloride: 104 mmol/L (ref 98–111)
Creatinine: 1.08 mg/dL — ABNORMAL HIGH (ref 0.44–1.00)
GFR, Estimated: >60 mL/min (ref >60)
Glucose, Bld: 95 mg/dL (ref 70–99)
Potassium: 4.1 mmol/L (ref 3.5–5.1)
Sodium: 137 mmol/L (ref 135–145)
Total Bilirubin: 0.4 mg/dL (ref <1.2)
Total Protein: 8.2 g/dL — ABNORMAL HIGH (ref 6.5–8.1)

## 2023-07-07 IMAGING — DX MM BREAST SURGICAL SPECIMEN
1 series · 2 of 2 positions shown · non-contrast
Comparison: Previous exam(s).

CLINICAL DATA: Status post targeted lymph node dissection today
after earlier radioactive seed localization.

EXAM:
SPECIMEN RADIOGRAPH OF THE RIGHT axilla

[Series 1: specimen digital x-ray · right · 0.07mm/px · 2 of 2 slices shown]
[im 1/2]
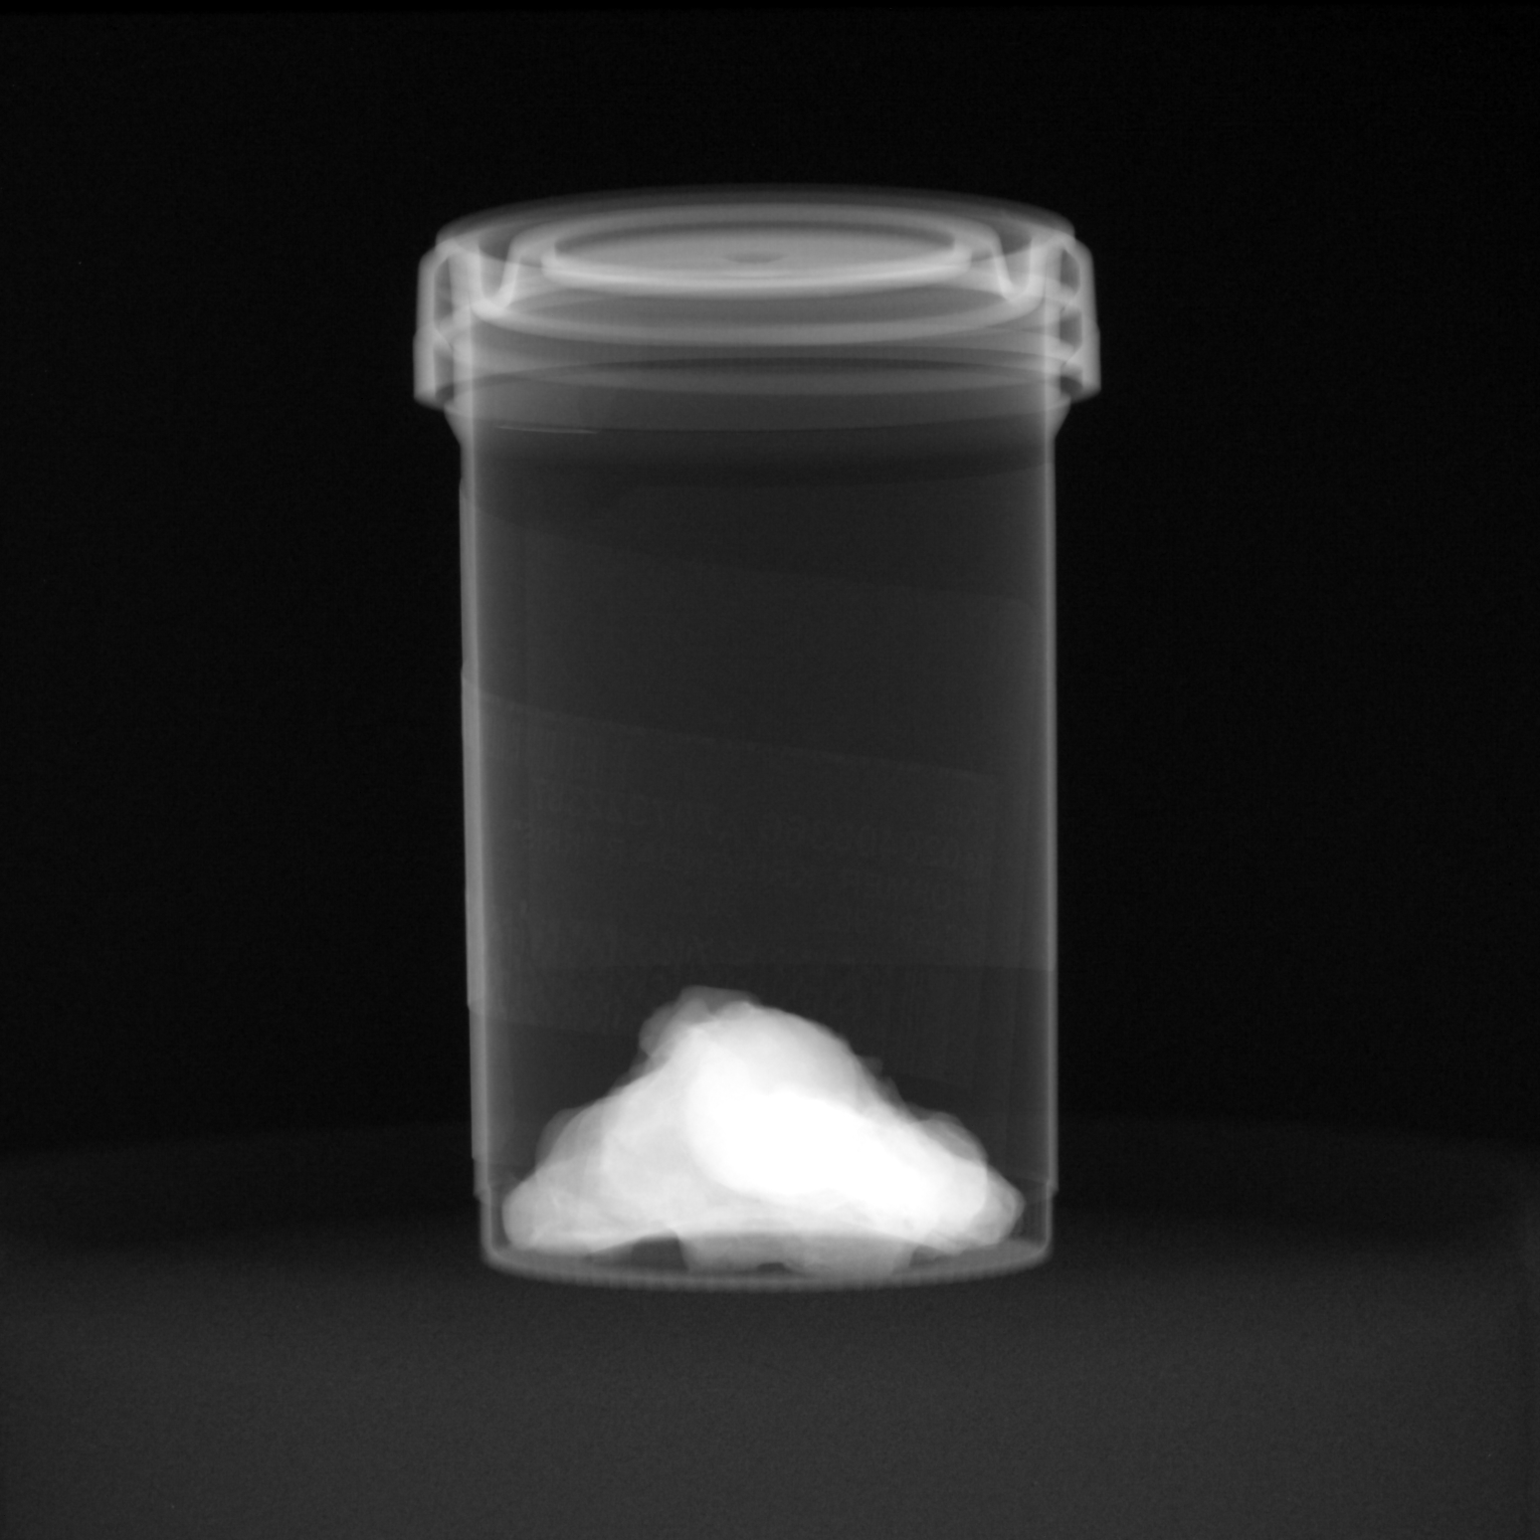
[im 2/2]
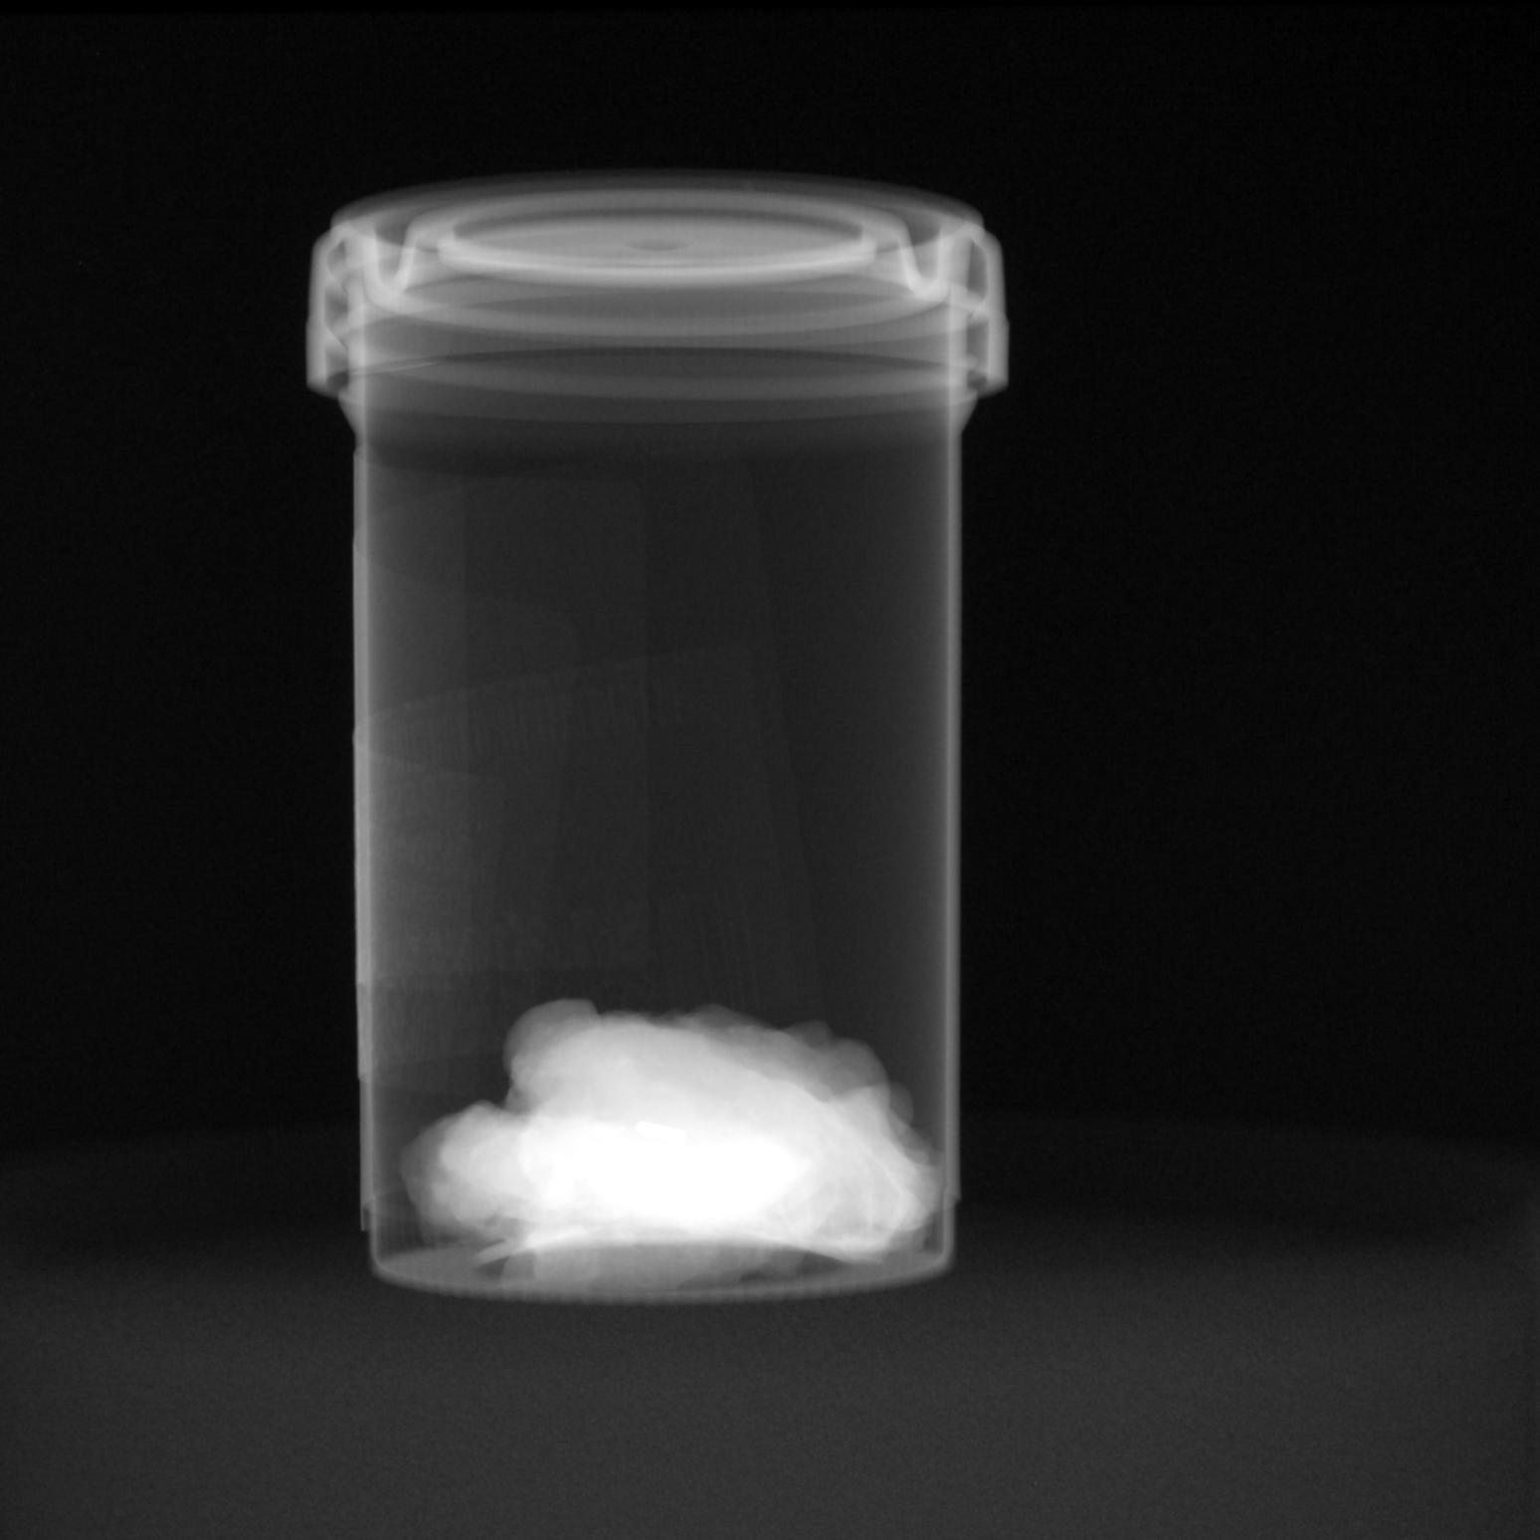

[2 of 2 positions shown; findings below may reference images not displayed]

FINDINGS: Status post excision of the right axilla. The radioactive seed is
present and completely intact within the specimen.

Biopsy clip is not seen in the specimen, an expected finding as the
clip appeared to have migrated below the biopsied lymph node on
yesterday's postprocedure mammogram. Findings discussed with the OR
staff during the procedure.
IMPRESSION: Specimen radiograph of the right axilla.

## 2023-07-07 MED ORDER — LETROZOLE 2.5 MG PO TABS
2.5000 mg | ORAL_TABLET | Freq: Every day | ORAL | 3 refills | Status: DC
  Filled 2023-07-07: qty 90, 90d supply, fill #0
  Filled 2023-11-18: qty 90, 90d supply, fill #1
  Filled 2024-05-24: qty 90, 90d supply, fill #2

## 2023-07-07 NOTE — Progress Notes (Signed)
Patient Care Team: Copland, Gwenlyn Found, MD as PCP - General (Family Medicine) Scarlett Presto, RN (Inactive) as Oncology Nurse Navigator Donnelly Angelica, RN as Oncology Nurse Navigator Pershing Proud, RN as Oncology Nurse Navigator Serena Croissant, MD as Medical Oncologist (Hematology and Oncology) Zelphia Cairo, MD as Consulting Physician (Obstetrics and Gynecology)  DIAGNOSIS:  Encounter Diagnosis  Name Primary?   Carcinoma of overlapping sites of right breast in female, estrogen receptor positive (HCC) Yes    SUMMARY OF ONCOLOGIC HISTORY: Oncology History  Carcinoma of overlapping sites of right breast in female, estrogen receptor positive (HCC)  03/14/2021 Initial Diagnosis   Patient palpated a lump in the retroareolar right breast with nipple retraction.  Mammogram revealed right breast mass 2.6 cm and a single right axillary lymph node measuring 1.2 cm.  (PET/CT negative) breast MRI revealed 4.2 cm right retroareolar mass with non-mass enhancement total measuring 14 cm, intraparenchymal lymph node 1.1 cm.  2 abnormal right axillary lymph nodes, biopsy revealed grade 2 IDC with DCIS ER 95%, PR 95%, Ki-67 20% focally 50%, HER2 negative, lymph node positive   04/10/2021 Cancer Staging   Staging form: Breast, AJCC 8th Edition - Clinical: Stage IIA (cT2, cN1, cM0, G2, ER+, PR+, HER2-) - Signed by Serena Croissant, MD on 04/10/2021 Stage prefix: Initial diagnosis Histologic grading system: 3 grade system   04/24/2021 -  Anti-estrogen oral therapy   Tamoxifen 10 mg daily started neoadjuvantly because of slight delay in surgery   06/06/2021 Surgery   Bilateral mastectomies 06/06/2021: [facility]: Left mastectomy: Grade 2 IDC 0.3 cm, margins negative Right mastectomy: Grade 3 IDC with DCIS 7.5 cm, margins negative, 4/4 lymph nodes positive ER 95%, PR 95%, HER2 1+, Ki-67 20% focally 50%   09/02/2021 -  Anti-estrogen oral therapy   Verzinio with Zoladex and Letrozole     CHIEF COMPLIANT:  Follow-up on Verzenio and letrozole   History of Present Illness   The patient, with a history of cancer, presents for a follow-up visit after completing two years of Verzenio. She reports no adverse effects from the medication and has been able to maintain an active lifestyle, including joining a karate class and making dietary changes. She has also undergone a bilateral oophorectomy in October. She has been monitoring her condition with Signatera, which has shown excellent results. She has stopped taking Pepcid and vitamin D, opting to spend more time outdoors for natural sunlight exposure. She reports some elevation in kidney function, but not significantly above her baseline.         ALLERGIES:  has No Known Allergies.  MEDICATIONS:  Current Outpatient Medications  Medication Sig Dispense Refill   acetaminophen (TYLENOL) 325 MG tablet Take 2 tablets (650 mg total) by mouth every 6 (six) hours as needed. 60 tablet 1   ascorbic acid (VITAMIN C) 1000 MG tablet Take by mouth.     Cholecalciferol (VITAMIN D-3) 125 MCG (5000 UT) TABS Take 125 mcg by mouth. (Patient not taking: Reported on 07/24/2022)     ibuprofen (ADVIL) 600 MG tablet Take 1 tablet (600 mg total) by mouth every 6 (six) hours as needed. 30 tablet 0   l-methylfolate-B6-B12 (METANX) 3-35-2 MG TABS tablet Take 1 tablet by mouth daily.     letrozole (FEMARA) 2.5 MG tablet Take 1 tablet (2.5 mg total) by mouth daily. 90 tablet 3   ondansetron (ZOFRAN) 4 MG tablet Take 1 tablet (4 mg total) by mouth every 8 (eight) hours as needed for nausea or vomiting.  30 tablet 6   oxyCODONE (ROXICODONE) 5 MG immediate release tablet Take 1 tablet (5 mg total) by mouth every 4 (four) hours as needed for severe pain. 10 tablet 0   Rimegepant Sulfate (NURTEC) 75 MG TBDP Use one tablet daily as needed for migraine (Patient not taking: Reported on 10/17/2021) 5 tablet 1   senna-docusate (SENOKOT-S) 8.6-50 MG tablet Take 1 tablet by mouth daily. 30 tablet  1   Turmeric 450 MG CAPS Take by mouth.     venlafaxine XR (EFFEXOR-XR) 37.5 MG 24 hr capsule Take 1 capsule (37.5 mg total) by mouth daily with breakfast. 30 capsule 5   vitamin E 180 MG (400 UNITS) capsule Take 400 Units by mouth daily.     No current facility-administered medications for this visit.    PHYSICAL EXAMINATION: ECOG PERFORMANCE STATUS: 1 - Symptomatic but completely ambulatory  Vitals:   07/07/23 1517  BP: 114/66  Pulse: 72  Resp: 18  Temp: (!) 97.3 F (36.3 C)  SpO2: 100%   Filed Weights   07/07/23 1517  Weight: 229 lb 9.6 oz (104.1 kg)      LABORATORY DATA:  I have reviewed the data as listed    Latest Ref Rng & Units 07/07/2023    3:04 PM 05/06/2023    1:42 PM 02/06/2023    3:09 PM  CMP  Glucose 70 - 99 mg/dL 95  89  191   BUN 6 - 20 mg/dL 13  13  11    Creatinine 0.44 - 1.00 mg/dL 4.78  2.95  6.21   Sodium 135 - 145 mmol/L 137  138  138   Potassium 3.5 - 5.1 mmol/L 4.1  4.2  4.6   Chloride 98 - 111 mmol/L 104  103  102   CO2 22 - 32 mmol/L 27  28  29    Calcium 8.9 - 10.3 mg/dL 30.8  65.7  84.6   Total Protein 6.5 - 8.1 g/dL 8.2  7.9  8.4   Total Bilirubin <1.2 mg/dL 0.4  0.4  0.4   Alkaline Phos 38 - 126 U/L 117  113  131   AST 15 - 41 U/L 21  22  27    ALT 0 - 44 U/L 16  16  30      Lab Results  Component Value Date   WBC 4.4 07/07/2023   HGB 13.1 07/07/2023   HCT 37.5 07/07/2023   MCV 84.5 07/07/2023   PLT 269 07/07/2023   NEUTROABS 2.2 07/07/2023    ASSESSMENT & PLAN:  Carcinoma of overlapping sites of right breast in female, estrogen receptor positive (HCC) 03/14/2021: Patient palpated a lump in the retroareolar right breast with nipple retraction.  Mammogram revealed right breast mass 2.6 cm and a single right axillary lymph node measuring 1.2 cm.  (PET/CT negative) breast MRI revealed 4.2 cm right retroareolar mass with non-mass enhancement total measuring 14 cm, intraparenchymal lymph node 1.1 cm.  2 abnormal right axillary lymph nodes,  biopsy revealed grade 2 IDC with DCIS ER 95%, PR 95%, Ki-67 20% focally 50%, HER2 negative, lymph node positive   PET CT scan: Negative for distant metastatic disease MammaPrint: High risk   Treatment plan: 1.  Bilateral mastectomies 06/06/2021: [facility]: Left mastectomy: Grade 2 IDC 0.3 cm, margins negative Right mastectomy: Grade 3 IDC with DCIS 7.5 cm, margins negative, 4/4 lymph nodes positive ER 95%, PR 95%, HER2 1+, Ki-67 20% focally 50% 2.  adjuvant chemotherapy with dose dense Adriamycin and Cytoxan x4 followed  by Taxol x12 (patient refused chemo and radiation) 3. Adjuvant antiestrogen therapy with AI plus ovarian suppression plus or minus abemaciclib versus tamoxifen with or without abemaciclib ------------------------------------------------------------------------------------------------------------------------------------ Current treatment: Zoladex with letrozole and abemaciclib started 08/22/2021 (oophorectomy 06/05/2023)   Abemaciclib toxicities: Current dosing: 150 mg p.o. twice daily No significant diarrhea Tolerating it extremely well. She completed 2 years of Verzinio and therefore she will discontinue it at this time.   Abdominal pain: CT CAP 03/03/22: No evidence of Met disease, 1.1 cm seroma rt Axilla Bone density 05/21/2022: Z-score -0.1: Normal Signatera minimal residual disease testing: Negative  She is working on losing weight and staying active and exercising regularly.  Breast cancer surveillance: No role for imaging since she had bilateral mastectomies. Return to clinic in 1 year for follow-up     No orders of the defined types were placed in this encounter.  The patient has a good understanding of the overall plan. she agrees with it. she will call with any problems that may develop before the next visit here. Total time spent: 30 mins including face to face time and time spent for planning, charting and co-ordination of care   Tamsen Meek,  MD 07/07/23

## 2023-07-07 NOTE — Assessment & Plan Note (Addendum)
03/14/2021: Patient palpated a lump in the retroareolar right breast with nipple retraction.  Mammogram revealed right breast mass 2.6 cm and a single right axillary lymph node measuring 1.2 cm.  (PET/CT negative) breast MRI revealed 4.2 cm right retroareolar mass with non-mass enhancement total measuring 14 cm, intraparenchymal lymph node 1.1 cm.  2 abnormal right axillary lymph nodes, biopsy revealed grade 2 IDC with DCIS ER 95%, PR 95%, Ki-67 20% focally 50%, HER2 negative, lymph node positive   PET CT scan: Negative for distant metastatic disease MammaPrint: High risk   Treatment plan: 1.  Bilateral mastectomies 06/06/2021: Shannon Hale: Left mastectomy: Grade 2 IDC 0.3 cm, margins negative Right mastectomy: Grade 3 IDC with DCIS 7.5 cm, margins negative, 4/4 lymph nodes positive ER 95%, PR 95%, HER2 1+, Ki-67 20% focally 50% 2.  adjuvant chemotherapy with dose dense Adriamycin and Cytoxan x4 followed by Taxol x12 (patient refused chemo and radiation) 3. Adjuvant antiestrogen therapy with AI plus ovarian suppression plus or minus abemaciclib versus tamoxifen with or without abemaciclib ------------------------------------------------------------------------------------------------------------------------------------ Current treatment: Zoladex with letrozole and abemaciclib started 08/22/2021 (oophorectomy 06/05/2023)   Abemaciclib toxicities: Current dosing: 150 mg p.o. twice daily No significant diarrhea Tolerating it extremely well. Shannon Hale completed 2 years of Shannon Hale and therefore Shannon Hale will discontinue it at this time.   Abdominal pain: CT CAP 03/03/22: No evidence of Met disease, 1.1 cm seroma rt Axilla Bone density 05/21/2022: Z-score -0.1: Normal Signatera minimal residual disease testing: Negative  Breast cancer surveillance: No role for imaging since Shannon Hale had bilateral mastectomies. Return to clinic in 1 year for follow-up

## 2023-07-15 ENCOUNTER — Other Ambulatory Visit: Payer: Self-pay

## 2023-07-15 NOTE — Progress Notes (Signed)
Removing from specialty pharmacy program. Therapy complete per 11.04.24 office visit notes. Verzenio stopped.

## 2023-08-18 DIAGNOSIS — Z170 Estrogen receptor positive status [ER+]: Secondary | ICD-10-CM | POA: Diagnosis not present

## 2023-08-18 DIAGNOSIS — C50811 Malignant neoplasm of overlapping sites of right female breast: Secondary | ICD-10-CM | POA: Diagnosis not present

## 2023-08-24 LAB — SIGNATERA
SIGNATERA MTM READOUT: 0 MTM/ml
SIGNATERA TEST RESULT: NEGATIVE

## 2023-08-29 ENCOUNTER — Telehealth: Payer: Self-pay

## 2023-08-29 NOTE — Telephone Encounter (Signed)
Called pt per MD to advise [employer] testing was negative/not detected. Pt verbalized understanding of results and knows [facility] will be in touch to schedule 3 mo repeat lab.   

## 2023-10-06 ENCOUNTER — Other Ambulatory Visit: Payer: Commercial Managed Care - PPO

## 2023-10-06 ENCOUNTER — Ambulatory Visit: Payer: Commercial Managed Care - PPO | Admitting: Pharmacist

## 2023-11-10 ENCOUNTER — Ambulatory Visit: Payer: Commercial Managed Care - PPO | Attending: General Surgery

## 2023-11-10 VITALS — Wt 232.5 lb

## 2023-11-10 DIAGNOSIS — Z483 Aftercare following surgery for neoplasm: Secondary | ICD-10-CM | POA: Insufficient documentation

## 2023-11-10 NOTE — Therapy (Signed)
 OUTPATIENT PHYSICAL THERAPY SOZO SCREENING NOTE   Patient Name: Shannon Hale MRN: [id number] DOB:09/30/1980, 43 y.o., female Today's Date: 11/10/2023  PCP: Pearline Cables, MD REFERRING PROVIDER: Emelia Loron, MD   PT End of Session - 11/10/23 1657     Visit Number 4   # unchanged due to screen only   PT Start Time 1656    PT Stop Time 1659    PT Time Calculation (min) 3 min    Activity Tolerance Patient tolerated treatment well    Behavior During Therapy [facility] for tasks assessed/performed             Past Medical History:  Diagnosis Date   Abnormal Pap smear    x 1   GERD (gastroesophageal reflux disease)    Headache(784.0)    migraines   Hepatitis 03/2004   unknown form of hepatitis, no current liver problems   History of breast cancer    right and left   History of chicken pox    as child   History of D&C    D&E   Migraine    Nausea    PONV (postoperative nausea and vomiting)    wants scopolamine patch   Vaginal Pap smear, abnormal    Wears contact lenses    Past Surgical History:  Procedure Laterality Date   BREAST RECONSTRUCTION Bilateral 06/06/2021   Procedure: BILATERAL BREAST RECONSTRUCTION WITH TISSUE REARRANGEMENT;  Surgeon: Peggye Form, DO;  Location: [facility];  Service: Plastics;  Laterality: Bilateral;   CHOLECYSTECTOMY N/A 12/01/2013   Procedure: LAPAROSCOPIC CHOLECYSTECTOMY ;  Surgeon: Shelly Rubenstein, MD;  Location: [facility];  Service: General;  Laterality: N/A;   DILATION AND EVACUATION N/A 07/14/2014   Procedure: DILATATION AND EVACUATION;  Surgeon: Zelphia Cairo, MD;  Location: [facility];  Service: Gynecology;  Laterality: N/A;   LAPAROSCOPIC SALPINGO OOPHERECTOMY Bilateral 06/05/2023   Procedure: LAPAROSCOPIC SALPINGO OOPHORECTOMY;  Surgeon: Tawni Levy, MD;  Location: [facility];  Service: Gynecology;  Laterality: Bilateral;   MASTECTOMY W/ SENTINEL NODE BIOPSY Right 06/06/2021    Procedure: RIGHT MASTECTOMY WITH AXILLARY SENTINEL LYMPH NODE BIOPSY;  Surgeon: Emelia Loron, MD;  Location: [facility];  Service: General;  Laterality: Right;   RADIOACTIVE SEED GUIDED AXILLARY SENTINEL LYMPH NODE Right 06/06/2021   Procedure: RADIOACTIVE SEED GUIDED RIGHT AXILLARY SENTINEL LYMPH NODE EXCISION;  Surgeon: Emelia Loron, MD;  Location: [facility];  Service: General;  Laterality: Right;   SEPTOPLASTY  2012   TONSILLECTOMY  2012   TOTAL MASTECTOMY Left 06/06/2021   Procedure: LEFT TOTAL MASTECTOMY;  Surgeon: Emelia Loron, MD;  Location:  SURGERY CENTER;  Service: General;  Laterality: Left;   WISDOM TOOTH EXTRACTION     age 60   Patient Active Problem List   Diagnosis Date Noted   S/P bilateral mastectomy 06/06/2021   Breast cancer (HCC) 03/27/2021   Carcinoma of overlapping sites of right breast in female, estrogen receptor positive (HCC) 03/22/2021   Headache 06/26/2020   Rash 06/26/2020   New daily persistent headache 05/21/2018   Disorder of ligament of left hand 07/20/2015   Obesity 09/25/2014   Dermatitis 12/31/2013   Cholelithiasis 02/29/2012   BACK PAIN, THORACIC REGION 11/19/2010   APHTHOUS STOMATITIS 08/10/2009   Migraine headache 09/23/2008    REFERRING DIAG: right breast cancer at risk for lymphedema  THERAPY DIAG: Aftercare following surgery for neoplasm  PERTINENT HISTORY: right breast cancer ER/PR+, Her2-, Ki 67 20% .  Bilateral mastectomy with no reconstruction 06/06/21. No chemotherapy. Still thinking about radiation.  4/4 nodes positive on the Rt.  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  She was told in the past that she has "scapular rotation" on the right   PRECAUTIONS: right UE Lymphedema risk, None  SUBJECTIVE: Pt returns for her last 3 month L-Dex screen.   PAIN:  Are you having pain? No  SOZO SCREENING: Patient was assessed today using the SOZO machine to  determine the lymphedema index score. This was compared to her baseline score. It was determined that she is within the recommended range when compared to her baseline and no further action is needed at this time. She will continue SOZO screenings. These are done every 3 months for 2 years post operatively followed by every 6 months for 2 years, and then annually.   L-DEX FLOWSHEETS - 11/10/23 1600       L-DEX LYMPHEDEMA SCREENING   Measurement Type Unilateral    L-DEX MEASUREMENT EXTREMITY Upper Extremity    POSITION  Standing    DOMINANT SIDE Right    At Risk Side Right    BASELINE SCORE (UNILATERAL) -3.3    L-DEX SCORE (UNILATERAL) -3.8    VALUE CHANGE (UNILAT) -0.5            P: Begin 6 month L-Dex screens.  Hermenia Bers, PTA 11/10/2023, 4:59 PM

## 2023-11-13 DIAGNOSIS — Z170 Estrogen receptor positive status [ER+]: Secondary | ICD-10-CM | POA: Diagnosis not present

## 2023-11-13 DIAGNOSIS — C50811 Malignant neoplasm of overlapping sites of right female breast: Secondary | ICD-10-CM | POA: Diagnosis not present

## 2023-11-18 ENCOUNTER — Other Ambulatory Visit: Payer: Self-pay

## 2023-11-19 LAB — SIGNATERA
SIGNATERA MTM READOUT: 0 MTM/ml
SIGNATERA TEST RESULT: NEGATIVE

## 2023-12-02 DIAGNOSIS — H52223 Regular astigmatism, bilateral: Secondary | ICD-10-CM | POA: Diagnosis not present

## 2024-05-07 ENCOUNTER — Other Ambulatory Visit: Payer: Self-pay | Admitting: *Deleted

## 2024-05-07 DIAGNOSIS — C50811 Malignant neoplasm of overlapping sites of right female breast: Secondary | ICD-10-CM

## 2024-05-07 NOTE — Progress Notes (Signed)
 Signatera renewal orders placed.

## 2024-05-10 ENCOUNTER — Ambulatory Visit

## 2024-05-18 DIAGNOSIS — C50811 Malignant neoplasm of overlapping sites of right female breast: Secondary | ICD-10-CM | POA: Diagnosis not present

## 2024-05-18 DIAGNOSIS — Z170 Estrogen receptor positive status [ER+]: Secondary | ICD-10-CM | POA: Diagnosis not present

## 2024-05-24 ENCOUNTER — Other Ambulatory Visit: Payer: Self-pay

## 2024-05-28 LAB — SIGNATERA ONLY (NATERA MANAGED)
SIGNATERA MTM READOUT: 0 MTM/ml
SIGNATERA TEST RESULT: NEGATIVE

## 2024-07-07 ENCOUNTER — Other Ambulatory Visit: Payer: Commercial Managed Care - PPO

## 2024-07-07 ENCOUNTER — Ambulatory Visit: Payer: Commercial Managed Care - PPO | Admitting: Hematology and Oncology

## 2024-07-08 ENCOUNTER — Ambulatory Visit: Admitting: Hematology and Oncology

## 2024-07-08 ENCOUNTER — Other Ambulatory Visit

## 2024-07-12 ENCOUNTER — Ambulatory Visit: Attending: General Surgery

## 2024-07-12 VITALS — Wt 226.0 lb

## 2024-07-12 DIAGNOSIS — Z483 Aftercare following surgery for neoplasm: Secondary | ICD-10-CM | POA: Insufficient documentation

## 2024-07-12 NOTE — Therapy (Signed)
 OUTPATIENT PHYSICAL THERAPY SOZO SCREENING NOTE   Patient Name: Shannon Hale MRN: [id number] DOB:01-08-1981, 43 y.o., female Today's Date: 07/12/2024  PCP: Watt Harlene BROCKS, MD REFERRING PROVIDER: Ebbie Cough, MD   PT End of Session - 07/12/24 1656     Visit Number 4   # unchanged due to screen only   PT Start Time 1652    PT Stop Time 1656    PT Time Calculation (min) 4 min    Activity Tolerance Patient tolerated treatment well    Behavior During Therapy [facility] for tasks assessed/performed          Past Medical History:  Diagnosis Date   Abnormal Pap smear    x 1   GERD (gastroesophageal reflux disease)    Headache(784.0)    migraines   Hepatitis 03/2004   unknown form of hepatitis, no current liver problems   History of breast cancer    right and left   History of chicken pox    as child   History of D&C    D&E   Migraine    Nausea    PONV (postoperative nausea and vomiting)    wants scopolamine  patch   Vaginal Pap smear, abnormal    Wears contact lenses    Past Surgical History:  Procedure Laterality Date   BREAST RECONSTRUCTION Bilateral 06/06/2021   Procedure: BILATERAL BREAST RECONSTRUCTION WITH TISSUE REARRANGEMENT;  Surgeon: Lowery Estefana RAMAN, DO;  Location: [facility];  Service: Plastics;  Laterality: Bilateral;   CHOLECYSTECTOMY N/A 12/01/2013   Procedure: LAPAROSCOPIC CHOLECYSTECTOMY ;  Surgeon: Vicenta DELENA Poli, MD;  Location: [facility];  Service: General;  Laterality: N/A;   DILATION AND EVACUATION N/A 07/14/2014   Procedure: DILATATION AND EVACUATION;  Surgeon: Truman Corona, MD;  Location: [facility];  Service: Gynecology;  Laterality: N/A;   LAPAROSCOPIC SALPINGO OOPHERECTOMY Bilateral 06/05/2023   Procedure: LAPAROSCOPIC SALPINGO OOPHORECTOMY;  Surgeon: Laurence Slater PARAS, MD;  Location: [facility];  Service: Gynecology;  Laterality: Bilateral;   MASTECTOMY W/ SENTINEL NODE BIOPSY Right 06/06/2021    Procedure: RIGHT MASTECTOMY WITH AXILLARY SENTINEL LYMPH NODE BIOPSY;  Surgeon: Ebbie Cough, MD;  Location: [facility];  Service: General;  Laterality: Right;   RADIOACTIVE SEED GUIDED AXILLARY SENTINEL LYMPH NODE Right 06/06/2021   Procedure: RADIOACTIVE SEED GUIDED RIGHT AXILLARY SENTINEL LYMPH NODE EXCISION;  Surgeon: Ebbie Cough, MD;  Location: [facility];  Service: General;  Laterality: Right;   SEPTOPLASTY  2012   TONSILLECTOMY  2012   TOTAL MASTECTOMY Left 06/06/2021   Procedure: LEFT TOTAL MASTECTOMY;  Surgeon: Ebbie Cough, MD;  Location: [facility];  Service: General;  Laterality: Left;   WISDOM TOOTH EXTRACTION     age 3   Patient Active Problem List   Diagnosis Date Noted   S/P bilateral mastectomy 06/06/2021   Breast cancer (HCC) 03/27/2021   Carcinoma of overlapping sites of right breast in female, estrogen receptor positive (HCC) 03/22/2021   Headache 06/26/2020   Rash 06/26/2020   New daily persistent headache 05/21/2018   Disorder of ligament of left hand 07/20/2015   Obesity 09/25/2014   Dermatitis 12/31/2013   Cholelithiasis 02/29/2012   BACK PAIN, THORACIC REGION 11/19/2010   APHTHOUS STOMATITIS 08/10/2009   Migraine headache 09/23/2008    REFERRING DIAG: right breast cancer at risk for lymphedema  THERAPY DIAG: Aftercare following surgery for neoplasm  PERTINENT HISTORY: right breast cancer ER/PR+, Her2-, Ki 67 20% . Bilateral mastectomy with  no reconstruction 06/06/21. No chemotherapy. Still thinking about radiation.  4/4 nodes positive on the Rt.  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  She was told in the past that she has scapular rotation on the right   PRECAUTIONS: right UE Lymphedema risk, None  SUBJECTIVE: Pt returns for her first 6 month L-Dex screen.   PAIN:  Are you having pain? No  SOZO SCREENING: Patient was assessed today using the SOZO machine  to determine the lymphedema index score. This was compared to her baseline score. It was determined that she is within the recommended range when compared to her baseline and no further action is needed at this time. She will continue SOZO screenings. These are done every 3 months for 2 years post operatively followed by every 6 months for 2 years, and then annually.   L-DEX FLOWSHEETS - 07/12/24 1600       L-DEX LYMPHEDEMA SCREENING   Measurement Type Unilateral    L-DEX MEASUREMENT EXTREMITY Upper Extremity    POSITION  Standing    DOMINANT SIDE Right    At Risk Side Right    BASELINE SCORE (UNILATERAL) -3.3    L-DEX SCORE (UNILATERAL) -1.9    VALUE CHANGE (UNILAT) 1.4         P: Cont 6 month L-Dex screens until 06/2025.  Aden Berwyn Caldron, PTA 07/12/2024, 4:57 PM

## 2024-08-17 ENCOUNTER — Inpatient Hospital Stay

## 2024-08-17 ENCOUNTER — Inpatient Hospital Stay: Admitting: Hematology and Oncology

## 2024-09-09 ENCOUNTER — Other Ambulatory Visit (HOSPITAL_COMMUNITY): Payer: Self-pay

## 2024-09-09 ENCOUNTER — Other Ambulatory Visit: Payer: Self-pay

## 2024-09-09 ENCOUNTER — Other Ambulatory Visit: Payer: Self-pay | Admitting: Hematology and Oncology

## 2024-09-09 MED ORDER — LETROZOLE 2.5 MG PO TABS
2.5000 mg | ORAL_TABLET | Freq: Every day | ORAL | 3 refills | Status: AC
  Filled 2024-09-09 (×2): qty 90, 90d supply, fill #0

## 2024-09-27 ENCOUNTER — Other Ambulatory Visit: Payer: Self-pay

## 2024-09-28 ENCOUNTER — Inpatient Hospital Stay: Admitting: Hematology and Oncology

## 2024-09-28 ENCOUNTER — Inpatient Hospital Stay

## 2024-10-18 ENCOUNTER — Inpatient Hospital Stay

## 2024-10-18 ENCOUNTER — Inpatient Hospital Stay: Admitting: Hematology and Oncology

## 2025-01-10 ENCOUNTER — Ambulatory Visit
